# Patient Record
Sex: Female | Born: 1945 | Race: Black or African American | Hispanic: No | Smoking: Former smoker
Health system: Southern US, Community
[De-identification: ages and names within clinical notes are randomized; demographics above are authoritative.]

## PROBLEM LIST (undated history)

## (undated) DIAGNOSIS — C50919 Malignant neoplasm of unspecified site of unspecified female breast: Secondary | ICD-10-CM

## (undated) DIAGNOSIS — I10 Essential (primary) hypertension: Secondary | ICD-10-CM

## (undated) HISTORY — PX: MASTECTOMY: SHX3

## (undated) HISTORY — PX: OTHER SURGICAL HISTORY: SHX169

---

## 2001-06-29 ENCOUNTER — Encounter (HOSPITAL_COMMUNITY): Payer: Self-pay | Admitting: Oncology

## 2001-06-29 ENCOUNTER — Encounter (HOSPITAL_COMMUNITY): Admission: RE | Admit: 2001-06-29 | Discharge: 2001-07-29 | Payer: Self-pay | Admitting: Oncology

## 2001-06-29 ENCOUNTER — Encounter: Admission: RE | Admit: 2001-06-29 | Discharge: 2001-06-29 | Payer: Self-pay | Admitting: Oncology

## 2002-03-30 ENCOUNTER — Encounter: Payer: Self-pay | Admitting: Family Medicine

## 2002-03-30 ENCOUNTER — Ambulatory Visit (HOSPITAL_COMMUNITY): Admission: RE | Admit: 2002-03-30 | Discharge: 2002-03-30 | Payer: Self-pay | Admitting: General Surgery

## 2002-06-10 ENCOUNTER — Emergency Department (HOSPITAL_COMMUNITY): Admission: EM | Admit: 2002-06-10 | Discharge: 2002-06-10 | Payer: Self-pay | Admitting: *Deleted

## 2002-06-10 ENCOUNTER — Encounter: Payer: Self-pay | Admitting: *Deleted

## 2002-06-22 ENCOUNTER — Ambulatory Visit (HOSPITAL_COMMUNITY): Admission: RE | Admit: 2002-06-22 | Discharge: 2002-06-22 | Payer: Self-pay | Admitting: General Surgery

## 2002-07-26 ENCOUNTER — Encounter: Payer: Self-pay | Admitting: *Deleted

## 2002-07-26 ENCOUNTER — Ambulatory Visit (HOSPITAL_COMMUNITY): Admission: RE | Admit: 2002-07-26 | Discharge: 2002-07-26 | Payer: Self-pay | Admitting: *Deleted

## 2002-11-07 ENCOUNTER — Inpatient Hospital Stay (HOSPITAL_COMMUNITY): Admission: AD | Admit: 2002-11-07 | Discharge: 2002-11-11 | Payer: Self-pay | Admitting: General Surgery

## 2002-11-07 ENCOUNTER — Encounter: Payer: Self-pay | Admitting: General Surgery

## 2003-11-15 ENCOUNTER — Ambulatory Visit (HOSPITAL_COMMUNITY): Admission: RE | Admit: 2003-11-15 | Discharge: 2003-11-15 | Payer: Self-pay | Admitting: General Surgery

## 2003-11-28 ENCOUNTER — Inpatient Hospital Stay (HOSPITAL_COMMUNITY): Admission: RE | Admit: 2003-11-28 | Discharge: 2003-12-01 | Payer: Self-pay | Admitting: General Surgery

## 2004-01-08 ENCOUNTER — Encounter (HOSPITAL_COMMUNITY): Admission: RE | Admit: 2004-01-08 | Discharge: 2004-02-02 | Payer: Self-pay | Admitting: Oncology

## 2004-01-08 ENCOUNTER — Encounter: Admission: RE | Admit: 2004-01-08 | Discharge: 2004-02-02 | Payer: Self-pay | Admitting: Oncology

## 2004-01-08 ENCOUNTER — Ambulatory Visit (HOSPITAL_COMMUNITY): Payer: Self-pay | Admitting: Oncology

## 2004-02-12 ENCOUNTER — Encounter (HOSPITAL_COMMUNITY): Admission: RE | Admit: 2004-02-12 | Discharge: 2004-03-13 | Payer: Self-pay | Admitting: Oncology

## 2004-02-12 ENCOUNTER — Encounter: Admission: RE | Admit: 2004-02-12 | Discharge: 2004-02-12 | Payer: Self-pay | Admitting: Oncology

## 2004-03-15 ENCOUNTER — Ambulatory Visit (HOSPITAL_COMMUNITY): Admission: RE | Admit: 2004-03-15 | Discharge: 2004-03-15 | Payer: Self-pay | Admitting: General Surgery

## 2004-03-21 ENCOUNTER — Encounter (HOSPITAL_COMMUNITY): Admission: RE | Admit: 2004-03-21 | Discharge: 2004-04-20 | Payer: Self-pay | Admitting: Oncology

## 2004-03-21 ENCOUNTER — Ambulatory Visit (HOSPITAL_COMMUNITY): Payer: Self-pay | Admitting: Oncology

## 2004-04-12 ENCOUNTER — Ambulatory Visit (HOSPITAL_COMMUNITY): Payer: Self-pay | Admitting: Oncology

## 2004-04-15 ENCOUNTER — Encounter: Admission: AD | Admit: 2004-04-15 | Discharge: 2004-04-15 | Payer: Self-pay | Admitting: Dentistry

## 2004-04-15 ENCOUNTER — Ambulatory Visit: Payer: Self-pay | Admitting: Dentistry

## 2004-04-25 ENCOUNTER — Encounter (HOSPITAL_COMMUNITY): Admission: RE | Admit: 2004-04-25 | Discharge: 2004-05-25 | Payer: Self-pay | Admitting: Oncology

## 2004-04-25 ENCOUNTER — Encounter: Admission: RE | Admit: 2004-04-25 | Discharge: 2004-04-25 | Payer: Self-pay | Admitting: Oncology

## 2004-05-03 ENCOUNTER — Ambulatory Visit (HOSPITAL_COMMUNITY): Admission: RE | Admit: 2004-05-03 | Discharge: 2004-05-03 | Payer: Self-pay | Admitting: Dentistry

## 2004-05-03 ENCOUNTER — Ambulatory Visit: Payer: Self-pay | Admitting: Dentistry

## 2004-05-13 ENCOUNTER — Ambulatory Visit (HOSPITAL_COMMUNITY): Payer: Self-pay | Admitting: Oncology

## 2004-05-29 ENCOUNTER — Encounter: Admission: RE | Admit: 2004-05-29 | Discharge: 2004-05-29 | Payer: Self-pay | Admitting: Oncology

## 2004-05-29 ENCOUNTER — Encounter (HOSPITAL_COMMUNITY): Admission: RE | Admit: 2004-05-29 | Discharge: 2004-06-28 | Payer: Self-pay | Admitting: Oncology

## 2004-06-24 ENCOUNTER — Ambulatory Visit: Payer: Self-pay | Admitting: Dentistry

## 2004-06-30 ENCOUNTER — Ambulatory Visit (HOSPITAL_COMMUNITY): Payer: Self-pay | Admitting: Oncology

## 2004-07-05 ENCOUNTER — Encounter: Admission: RE | Admit: 2004-07-05 | Discharge: 2004-07-05 | Payer: Self-pay | Admitting: Oncology

## 2004-07-05 ENCOUNTER — Encounter (HOSPITAL_COMMUNITY): Admission: RE | Admit: 2004-07-05 | Discharge: 2004-08-04 | Payer: Self-pay | Admitting: Oncology

## 2004-08-08 ENCOUNTER — Encounter (HOSPITAL_COMMUNITY): Admission: RE | Admit: 2004-08-08 | Discharge: 2004-09-07 | Payer: Self-pay | Admitting: Oncology

## 2004-08-08 ENCOUNTER — Encounter: Admission: RE | Admit: 2004-08-08 | Discharge: 2004-08-08 | Payer: Self-pay | Admitting: Oncology

## 2004-08-29 ENCOUNTER — Ambulatory Visit (HOSPITAL_COMMUNITY): Payer: Self-pay | Admitting: Oncology

## 2004-08-30 ENCOUNTER — Ambulatory Visit (HOSPITAL_COMMUNITY): Payer: Self-pay | Admitting: Oncology

## 2004-09-19 ENCOUNTER — Encounter: Admission: RE | Admit: 2004-09-19 | Discharge: 2004-09-19 | Payer: Self-pay | Admitting: Oncology

## 2004-09-19 ENCOUNTER — Encounter (HOSPITAL_COMMUNITY): Admission: RE | Admit: 2004-09-19 | Discharge: 2004-10-19 | Payer: Self-pay | Admitting: Oncology

## 2004-10-14 ENCOUNTER — Ambulatory Visit (HOSPITAL_COMMUNITY): Payer: Self-pay | Admitting: Oncology

## 2004-10-21 ENCOUNTER — Encounter: Admission: RE | Admit: 2004-10-21 | Discharge: 2004-11-02 | Payer: Self-pay | Admitting: Oncology

## 2004-11-15 ENCOUNTER — Encounter: Admission: RE | Admit: 2004-11-15 | Discharge: 2004-11-15 | Payer: Self-pay | Admitting: Oncology

## 2004-12-09 ENCOUNTER — Ambulatory Visit (HOSPITAL_COMMUNITY): Payer: Self-pay | Admitting: Oncology

## 2004-12-19 ENCOUNTER — Encounter: Admission: RE | Admit: 2004-12-19 | Discharge: 2004-12-19 | Payer: Self-pay | Admitting: Oncology

## 2005-02-24 ENCOUNTER — Ambulatory Visit (HOSPITAL_COMMUNITY): Payer: Self-pay | Admitting: Oncology

## 2005-02-24 ENCOUNTER — Encounter: Admission: RE | Admit: 2005-02-24 | Discharge: 2005-02-24 | Payer: Self-pay | Admitting: Oncology

## 2005-03-31 ENCOUNTER — Encounter (HOSPITAL_COMMUNITY): Admission: RE | Admit: 2005-03-31 | Discharge: 2005-04-30 | Payer: Self-pay | Admitting: Oncology

## 2005-03-31 ENCOUNTER — Encounter: Admission: RE | Admit: 2005-03-31 | Discharge: 2005-03-31 | Payer: Self-pay | Admitting: Oncology

## 2005-04-21 ENCOUNTER — Ambulatory Visit (HOSPITAL_COMMUNITY): Payer: Self-pay | Admitting: Oncology

## 2005-12-16 ENCOUNTER — Ambulatory Visit: Payer: Self-pay | Admitting: Family Medicine

## 2005-12-19 ENCOUNTER — Encounter (HOSPITAL_COMMUNITY): Admission: RE | Admit: 2005-12-19 | Discharge: 2006-01-18 | Payer: Self-pay | Admitting: Oncology

## 2005-12-19 ENCOUNTER — Encounter: Admission: RE | Admit: 2005-12-19 | Discharge: 2005-12-19 | Payer: Self-pay | Admitting: Oncology

## 2005-12-19 ENCOUNTER — Ambulatory Visit (HOSPITAL_COMMUNITY): Payer: Self-pay | Admitting: Oncology

## 2005-12-24 ENCOUNTER — Ambulatory Visit: Payer: Self-pay | Admitting: Oncology

## 2006-01-01 ENCOUNTER — Encounter: Payer: Self-pay | Admitting: Family Medicine

## 2006-01-01 DIAGNOSIS — I509 Heart failure, unspecified: Secondary | ICD-10-CM | POA: Insufficient documentation

## 2006-01-01 DIAGNOSIS — I10 Essential (primary) hypertension: Secondary | ICD-10-CM | POA: Insufficient documentation

## 2006-01-01 DIAGNOSIS — J449 Chronic obstructive pulmonary disease, unspecified: Secondary | ICD-10-CM

## 2006-01-01 DIAGNOSIS — M545 Low back pain, unspecified: Secondary | ICD-10-CM | POA: Insufficient documentation

## 2006-01-01 DIAGNOSIS — F411 Generalized anxiety disorder: Secondary | ICD-10-CM | POA: Insufficient documentation

## 2006-01-01 DIAGNOSIS — J4489 Other specified chronic obstructive pulmonary disease: Secondary | ICD-10-CM | POA: Insufficient documentation

## 2006-01-01 DIAGNOSIS — Z853 Personal history of malignant neoplasm of breast: Secondary | ICD-10-CM

## 2006-11-04 ENCOUNTER — Emergency Department (HOSPITAL_COMMUNITY): Admission: EM | Admit: 2006-11-04 | Discharge: 2006-11-04 | Payer: Self-pay | Admitting: Emergency Medicine

## 2006-11-06 ENCOUNTER — Emergency Department (HOSPITAL_COMMUNITY): Admission: EM | Admit: 2006-11-06 | Discharge: 2006-11-06 | Payer: Self-pay | Admitting: Emergency Medicine

## 2006-12-04 ENCOUNTER — Encounter (HOSPITAL_COMMUNITY): Admission: RE | Admit: 2006-12-04 | Discharge: 2007-01-03 | Payer: Self-pay | Admitting: Oncology

## 2007-01-04 ENCOUNTER — Ambulatory Visit (HOSPITAL_COMMUNITY): Payer: Self-pay | Admitting: Oncology

## 2007-01-04 ENCOUNTER — Encounter (HOSPITAL_COMMUNITY): Admission: RE | Admit: 2007-01-04 | Discharge: 2007-02-03 | Payer: Self-pay | Admitting: Oncology

## 2010-02-23 ENCOUNTER — Encounter (HOSPITAL_COMMUNITY): Payer: Self-pay | Admitting: Oncology

## 2010-02-24 ENCOUNTER — Encounter: Payer: Self-pay | Admitting: General Surgery

## 2010-06-21 NOTE — H&P (Signed)
NAME:  [name], [name]                          ACCOUNT NO.:  [id number]   MEDICAL RECORD NO.:  [id number]                   PATIENT TYPE:  INP   LOCATION:  A228                                 FACILITY:  [facility]   PHYSICIAN:  Dirk Dress. Katrinka Blazing, M.D.                DATE OF BIRTH:  1945/05/22   DATE OF ADMISSION:  11/07/2002  DATE OF DISCHARGE:                                HISTORY & PHYSICAL   HISTORY OF PRESENT ILLNESS:  A 65 year old female with history of  hypertension and recurrent palpitations that has become much worse over the  past two days.  The patient has a history of cardiomyopathy with a finding  of left ventricular ejection fraction of 25-30% in May, 2004, with moderate  to marked low left ventricular hypokinesis.  She was treated and in July,  2004, repeat echocardiogram showed mild global hypokinesis with ejection  fraction of 51%.  The patient had persistent tachycardia which was well  controlled on Toprol 100 mg daily.  She was stable, but her Medicaid ran out  and she ran out of her medications at the end of the month.  She therefore,  could not get Toprol nor could she get Ativan because of lack of money.  Over the last two days, she has had increasing dyspnea, increasing shortness  of breath and worsening palpitations.  She has also had some increased  anxiety.  All of this is felt to be related to Toprol withdrawal and Ativan  withdrawal.  The patient has had some episode of impending doom, but this  probably is due to her medication withdrawal.  She has not had chest pain.  Her episodes of shortness of breath has been related to palpitations and  they have been related to tachycardia induced worsening of her  cardiomyopathy.  The patient was seen in the office and was tachycardic with  a heart rate of 138 to 140.  She appeared to be extremely anxious and  somewhat tremulous.  She is admitted for reinstitution of her medication and  we will make arrangements for her  to get the Toprol and Ativan after she is  stabilized.   PAST MEDICAL HISTORY:  1. Nonischemic cardiomyopathy as noted above with negative stress test in     July, 2004.  2. Hypertension, well-controlled on Toprol.  3. Chronic anxiety, long-term, well-controlled on Ativan until present     situation.  4. History of breast cancer, status post left modified radical mastectomy in     1990.  Eight month history of chemotherapy.  5. Lumbar degenerative disk disease with chronic low back pain.   PAST SURGICAL HISTORY:  1. Excision of mass, left hip in 1992.  2. Total abdominal hysterectomy with bilateral salpingo-oophorectomy many     years ago.   ALLERGIES:  MORPHINE which causes itching.   MEDICATIONS:  1. Ativan 1 mg t.i.d.  2. Toprol  XL 50 mg daily.  3. Flexeril 5 mg t.i.d.  4. Ultram 50 mg daily.  5. Bextra 10 mg daily.  6. Ecotrin 81 mg daily.   FAMILY HISTORY:  The patient is one of a set of quadruplets that was born at  this hospital in 1946/02/02.  Her three sisters all died of complications of  breast cancer.  Her other six siblings died from multiple other illnesses.  She has one child who reportedly is healthy.  Her parents are both deceased.  There is a questionable history of pancreatic cancer, diabetes and stroke.   PHYSICAL EXAMINATION:  GENERAL:  The patient is very anxious.  She is  tremulous at rest and will not sit on the exam table for long.  VITAL SIGNS:  Blood pressure 130/70.  Normally, her blood pressure is about  100/80, pulse 120/140, respirations 20.  Weight 150 pounds.  HEENT:  Unremarkable except for poor dentition.  NECK:  Supple, no JVD, bruit, no adenopathy or thyromegaly.  CHEST:  Clear to auscultation.  No rales, rubs, rhonchi or wheezes.  Chest  wall reveals a well-healed mastectomy site on the left.  No recurrence.  Right breast is atrophic, no masses.  Axillae normal, no adenopathy.  HEART:  Regular rate and rhythm without murmur or gallop as she  has variable  heart rate which did decrease to about 110 after she was allowed to lie down  on the exam table.  ABDOMEN:  Soft, nontender, no masses.  EXTREMITIES:  No cyanosis, clubbing or edema.  NEUROLOGIC:  No focal motor, sensory or cerebellar deficit.   IMPRESSIONS:  1. Recurrent tachycardia due to Toprol withdrawal.  2. Anxiety with exacerbation due to Ativan withdrawal.  3. Cardiomyopathy, nonischemic, possible exacerbation due to tachycardia or     hypertension, still controlled.  4. History of breast carcinoma, status post left modified radical     mastectomy, stable.  5. Lumbar degenerative disk disease with chronic low back pain.   PLAN:  The patient will be admitted and her medications will be  reinstituted.  Hopefully, she has had no further injury and once her  medication is reinstituted, we will be able to make some arrangements for  her to get her medications, since her life actually depends on her  medications.  Will ask Social Service to investigate the reason for  withdrawing of her Medicaid and see if this can be reinstituted.      ___________________________________________                                         Dirk Dress. Katrinka Blazing, M.D.   LCS/MEDQ  D:  11/07/2002  T:  11/08/2002  Job:  [id number]

## 2010-06-21 NOTE — Op Note (Signed)
[name], [name]                ACCOUNT NO.:  [id number]   MEDICAL RECORD NO.:  [id number]          PATIENT TYPE:  AMB   LOCATION:  DAY                           FACILITY:  [facility]   PHYSICIAN:  Jerolyn Shin C. Katrinka Blazing, M.D.   DATE OF BIRTH:  [birth year]   DATE OF PROCEDURE:  03/15/2004  DATE OF DISCHARGE:                                 OPERATIVE REPORT   PREOPERATIVE DIAGNOSIS:  Breast carcinoma.   POSTOPERATIVE DIAGNOSIS:  Breast carcinoma.   PROCEDURE:  Left subclavian dual lumen catheter insertion.   SURGEON:  Dr. Katrinka Blazing.   DESCRIPTION:  The patient was taken to the operating suite. Her left chest  and neck were prepped and draped in a sterile field. Local infiltration was  carried out with 1% Xylocaine. Small incision was made in the outer third of  the subclavicular area. The patient was placed in reversed Trendelenburg  position. The left subclavian vein was accessed with a single stick.  Guidewire was placed under fluoroscopic guidance and positioned in the right  atrium. Next, the catheter was tunneled from the subclavian area to the left  parasternal border at about T5. The introducer was placed under fluoroscopic  guidance, and the catheter was positioned through the introducer through the  junction of the right atrium and superior vena cava. The catheter was  sutured to the dermis at its insertion site. It was then sutured to the skin  with two sutures of 3-0 Prolene. The skin puncture site in the subclavicular  area was closed with 4-0 Vicryl. The patient tolerated the procedure well.  Dressings were placed. She was awakened from anesthesia, transferred to a  bed, and taken to the post-anesthetic care unit for monitoring and for  followup chest x-ray.      LCS/MEDQ  D:  03/15/2004  T:  03/15/2004  Job:  [id number]

## 2010-06-21 NOTE — Discharge Summary (Signed)
NAME:  [name], [name]                          ACCOUNT NO.:  [id number]   MEDICAL RECORD NO.:  [id number]                   PATIENT TYPE:  INP   LOCATION:  A228                                 FACILITY:  [facility]   PHYSICIAN:  Dirk Dress. Katrinka Blazing, M.D.                DATE OF BIRTH:  1945/11/16   DATE OF ADMISSION:  11/07/2002  DATE OF DISCHARGE:  11/11/2002                                 DISCHARGE SUMMARY   DISCHARGE DIAGNOSES:  1. Rebound tachycardia due to Toprol withdrawal.  2. Anxiety disorder with exacerbation due to Ativan withdrawal.  3. Nonischemic cardiomyopathy complicated by tachycardia.  4. History of breast carcinoma.  5. Lumbar degenerative disk disease with chronic low back pain.   DISPOSITION:  The patient is discharged home in stable and satisfactory  condition.   DISCHARGE MEDICATIONS:  1. Protonix 40 mg every day.  2. Ativan 1 mg t.i.d.  3. Toprol XL 100 mg every day.  4. Aspirin 81 mg every day.  5. Ultram 50 mg b.i.d.   The patient is scheduled to be seen in the office in one week.   SUMMARY:  This is a 65 year old female with a history of hypertension,  recurrent palpitations that have become much worse over the past two days.  She has a history of cardiomyopathy with a finding of decreased left  ventricular ejection fraction with global hypokinesis.  She had tachycardia  which was well controlled on Toprol.  The patient was stable but her  Medicaid ran out, and she was unable to purchase her medications.  She could  not get Toprol or Ativan.  Over the two days prior to admission she had  increasing dyspnea, shortness of breath, worsening palpitations, and  significantly increased anxiety.  All of this was felt to be related to  Toprol withdrawal and Ativan withdrawal.  The patient had some episodes of  impending doom.  She denied chest pains.  She was seen in the office with  the tachycardia with a heart rate of 140.  She appeared extremely anxious  and  tremulous.  She was admitted for re-institution of her medications and  involvement of social service so that we could make arrangements to get her  medications, after she was stabilized.   EXAMINATION:  GENERAL:  She was anxious and tremulous at rest.  She would  not sit still on the exam table.  VITAL SIGNS:  Blood pressure was 130/70, pulse was 120-140.  LUNGS:  Clear.  HEART:  Regular except increased heart rate.  Other exam was unremarkable.   The patient was admitted and started on her baseline medications.  Cardiac  enzymes and EKGs were done serially to rule out myocardial ischemia.  Her  enzymes remained flat with normal CK-MBs and troponins x 3.  Heart rate was  easily controlled after she was re-instituted on her Toprol therapy.  O2  saturations remained  good on room air.  After institution of Ativan, her  anxiety was improved and her tremor resolved.  Social service became  involved, and they made arrangements for her to have reinstitution of her  Medicaid services.  The patient was advised to increase her activity and  with increase in activity her heart rate remained stable, though she did  have some mild dyspnea.  By November 11, 2002, she was without complaints.  Pulse was about 85, blood pressure 113/70.  Lungs were clear.  It was felt  that she had adequately recovered and she was discharged home in  satisfactory condition.     ___________________________________________                                         Dirk Dress Katrinka Blazing, M.D.   LCS/MEDQ  D:  12/10/2002  T:  12/10/2002  Job:  [id number]

## 2010-06-21 NOTE — Procedures (Signed)
NAME:  [name], [name]                          ACCOUNT NO.:  [id number]   MEDICAL RECORD NO.:  [id number]                   PATIENT TYPE:  OUT   LOCATION:  RAD                                  FACILITY:  [facility]   PHYSICIAN:  Kem Boroughs, M.D.                 DATE OF BIRTH:  1945/10/17   DATE OF PROCEDURE:  06/22/2002  DATE OF DISCHARGE:                                  ECHOCARDIOGRAM   PROCEDURE:  Echocardiogram.   INDICATIONS FOR PROCEDURE:  [name] is a 65 year old female with a  history of tachycardia who was referred for echocardiogram.   IMPRESSION:  The technical quality of the study is reasonable.   The aorta is within normal limits at 3.1 cm.   The left atrium is also within normal limits at 2.4 cm. No obvious clots or  masses were appreciated and the patient appeared to be in sinus rhythm  during this procedure.   The interventricular septum and posterior wall were within normal limits in  thickness at 0.8 cm for each.   The aortic valve appeared reasonably structurally normal with good leaflet  excursion. No aortic insufficiency was noted. Doppler interrogation of the  aortic valve was within normal limits.   The mitral valve appeared mildly thickened, particularly on the distal  portion of the anterior leaflet but with reasonable leaflet excursion. Trace  to mild mitral regurgitation was present. No mitral valve prolapse was  appreciated.   The pulmonic valve was not well visualized.   The tricuspid valve appeared grossly structurally normal with mild tricuspid  regurgitation noted.   The left ventricle was normal in size. Left ventricular systolic function  was markedly decreased with an estimated ejection fraction of 25-30%. The  right atrium and right ventricle appeared normal in size. Right ventricular  systolic function also appeared diminished. No mitral valve prolapse. The  presence of a heart string is noted in the left ventricle, which is a  normal  variant.   IMPRESSION:  1. Normal chamber sizes.  2. Moderate to marked global hypokinesis is present.  3. Estimated ejection fraction of 25-30%.  4. Trace to mild mitral and tricuspid regurgitation.  5. Mildly thickened anterior leaflet of the mitral valve.  6.     No mitral valve prolapse. The presence of a heart string is noted in the     left ventricle, which is a normal variant.  7. In this patient with a history of tachycardia, a tachycardia induced     cardiomyopathy could certainly be in the differential diagnosis. Consider     further cardiac evaluation.                                               Kem Boroughs, M.D.  TB/MEDQ  D:  06/22/2002  T:  06/22/2002  Job:  [id number]   cc:   Dirk Dress. Katrinka Blazing, M.D.  [address]  [place]  [place] [address]  Fax: [phone]

## 2010-06-21 NOTE — Op Note (Signed)
[name], [name]                ACCOUNT NO.:  [id number]   MEDICAL RECORD NO.:  [id number]          PATIENT TYPE:  AMB   LOCATION:  DAY                          FACILITY:  [facility]   PHYSICIAN:  Charlynne Pander, D.D.S.DATE OF BIRTH:  [birth year]   DATE OF PROCEDURE:  05/03/2004  DATE OF DISCHARGE:                                 OPERATIVE REPORT   PREOPERATIVE DIAGNOSES:  1.  Breast cancer.  2.  Prechemotherapy dental protocol.  3.  Chronic periodontitis.  4.  Chronic apical periodontitis.  5.  Multiple routine root segments.  6.  Malocclusion.   POSTOPERATIVE DIAGNOSES:  1.  Breast cancer.  2.  Prechemotherapy dental protocol.  3.  Chronic periodontitis.  4.  Chronic apical periodontitis.  5.  Multiple routine root segments.  6.  Malocclusion.  7.  Bilateral hyperplastic maxillary tuberosities.   OPERATIONS:  1.  Dental examination.  2.  Extraction of remaining teeth number 5, 6, 7, 8, 9, 10, 18, 19, 20, 21,      22, 23, 26, 27, and 29.  3.  Four quadrants of alveoloplasty.  4.  Bilateral maxillary tuberosity reductions  5.  Distal wedge procedure of the mandibular right quadrant from the      retromolar pad anterior to previous tooth number 30.  6.  Buccal exostoses reductions area 12-13.   SURGEON:  Charlynne Pander, D.D.S.   ASSISTANT:  Elliot Dally (Sales executive).   ANESTHESIA:  1.  General anesthesia via nasal endotracheal tube Jenelle Mages. Fortune,      M.D., as the attending.  2.  Local anesthesia with total utilization of five carpules each containing      36 mg of Xylocaine with 0.018 mg of epinephrine as well as two carpules      containing 9 mg of Marcaine with 0.009 mg of epinephrine.   MEDICATION:  Ampicillin 2.0 Grams IV prior to invasive dental procedures.   SPECIMENS:  There were 15 teeth which were discarded.   DRAINS/CULTURES:  None.   COMPLICATIONS:  None.   FLUIDS:  900 mL lactated Ringer's solution.   ESTIMATED BLOOD LOSS:  100  mL.   INDICATIONS FOR PROCEDURE:  The patient was diagnosed with breast cancer and  was planned to begin chemotherapy with Dr. Mariel Sleet.  A dental consultation  was requested to rule out dental infection which may affect the patient's  systemic health while on active chemotherapy.  The patient was examined and  treatment planned for extraction of all remaining teeth with alveoloplasty  and preprosthetic surgery as indicated.  This treatment plan was formulated  to decrease the risks and complications associated with dental infection  from affecting the patient's systemic health while on active chemotherapy as  well as to assist in future prosthodontic rehabilitation.   OPERATIVE FINDINGS:  The patient was examined in operating room #6.  The  teeth were identified for extraction. The patient was noted to be affected  by chronic periodontitis, chronic apical periodontitis, multiple retained  root segments, and significant malocclusion.  During further evaluation of  the occlusion, the patient  was noted to have bilateral maxillary  hyperplastic tuberosities which would need to be reduced to assist in future  fabrication of upper and lower dentures.   DESCRIPTION OF PROCEDURE:  The patient is brought to the main operating room  #6.  The patient is placed in the supine position on the operating room  table.  General anesthesia was induced via nasal endotracheal tube.  A  throat pack was placed at this time.  Patient was then prepped and draped in  the usual manner for a dental medicine procedure.  The oral cavity was  thoroughly examined with the findings as noted above.  Patient was then  ready for the oral surgical procedures as follows:   Local anesthesia was administered to the maxillary quadrants and  sequentially to the mandibular quadrants over the two hour long procedure.  A total utilization of 5 carpules each containing 36 mg of Xylocaine with  0.018 mg of epinephrine along with  2 carpules containing 9 mg of Marcaine  with 0.009 mg of epinephrine were administered.   The maxillary quadrants were first approached.  Anesthesia was delivered as  previously described.  A 15 blade incision was made from the distal of the  maxillary right tuberosity through the distal of #14.  Surgical flap was  then carefully reflected.  The remaining tooth numbers 5, 6, 7, 8, 9 and 10  were then subluxated with a series of straight elevators and removed with a  150 forceps without complications.  Alveoloplasty was then performed  utilizing a rongeurs and bone file.  A maxillary right tuberosity reduction  was then achieved utilizing rongeurs and bone file, soft tissue pickups and  a 15 blade incision to remove the redundant tissues.  Prominent buccal  exostoses on the facial of tooth numbers 12, 13 area was carefully removed  at this time with a rongeur and bone file.  Irrigation was then used to  debride the area using sterile saline x2.  The tissues were approximated and  trimmed appropriately.  The surgical site on the maxillary left was then  closed from the distal of #14 through the mesial of #9 utilizing 3-0 chromic  gut suture in a continuous interrupted suture technique x1.  The maxillary  right quadrant was then approached and closed utilizing 3-0 chromic gut  suture material in a continuous interrupted suture technique from the distal  of the maxillary tuberosity through the mesial of #8 utilizing a continuous  interrupted suture technique x1.   The remaining mandibular quadrants were then approached.  Anesthesia was  delivered as previously described.  A 15 blade incision was made from the  distal of #30 through the distal of #17.  Surgical flap was then carefully  reflected.  Teeth were subluxated and then removed with a 151 forceps  without complications.  Significant alveoloplasty then performed utilizing a rongeurs and bone file.  Granulation tissue in the area of  tooth numbers 22  and 23 was then carefully removed with rongeurs and bone file.  The surgical  sites were then irrigated with copious amounts of sterile saline.  The  tissues were approximated and trimmed appropriately.  The mandibular left  quadrant was then closed from the distal of #17 through the mesial of #24  utilizing 3-0 chromic gut suture in a continuous interrupted suture  technique x1.  The mandibular right quadrant was then approached and closed  from the mesial of #30 through the mesial of #25 utilizing 3-0 chromic gut  suture material in a continuous interrupted suture technique x1.  At this  point in time the entire mouth was irrigated with copious amounts of sterile  saline.  The jaws were approximately to determine if there was sufficient  room for dentures.  At this point in time, it was determined that the  maxillary left quadrant would need to be reduced further and the mandibular  right quadrant in the area of the retromolar pad and anterior to  approximately #30 area would also need to have a distal wedge procedure to  remove excess soft tissues.  The maxillary left quadrant was then  approached.  A #15 blade incision was made from the maxillary left  tuberosity through the mesial of #14.  Flap was carefully reflected.  Redundant tissues were removed with a #15 blade and soft tissue pickups.  The tissues were approximated and trimmed appropriately.  The surgical site  was then irrigated with copious amounts of sterile saline.  Surgical site  was then closed from the distal tuberosity through the mesial of #30  utilizing 3-0 chromic gut suture material in a continuous interrupted suture  technique x1.   The mandibular right quadrant was then approached. A 15 blade incision was  made from the retromolar pad area anterior to #30 along the alveolar ridge.  Redundant tissues were removed with a 15 blade and soft tissue pickups.  The  tissues were approximated and trimmed  appropriately.  The surgical site was  then irrigated with copious amounts of sterile saline.  Surgical site was  then closed from the tuberosity through the mesial of #30 utilizing 4-0  chromic gut suture in a continuous interrupted suture technique x1.   At this point in time, the entire mouth was irrigated again.  The patient  was examined for complications, seeing none, the dental medicine procedure  was deemed to be complete.  The throat pack was removed at this time.  A  series of 4x4 gauzes and oral airway were placed at the request of the  anesthesia team.  Patient was then handed over to the anesthesia team for  final disposition.  After appropriate amount of time, patient was extubated  and taken to the post anesthesia care unit with stable vital signs, good  oxygenation level.  All counts were correct for the dental medicine  procedure.  Patient will be given appropriate pain medication and will be followed up in approximately one week for evaluation for suture removal.      RFK/MEDQ  D:  05/03/2004  T:  05/03/2004  Job:  [id number]   cc:   Ladona Horns. Neijstrom, MD  [address]  [place]  [place] [address]  Fax: [phone]

## 2010-06-21 NOTE — Discharge Summary (Signed)
[name], [name]                ACCOUNT NO.:  [id number]   MEDICAL RECORD NO.:  [id number]          PATIENT TYPE:  INP   LOCATION:  A301                          FACILITY:  [facility]   PHYSICIAN:  Jerolyn Shin C. Katrinka Blazing, M.D.   DATE OF BIRTH:  02-06-1945   DATE OF ADMISSION:  11/28/2003  DATE OF DISCHARGE:  10/28/2005LH                                 DISCHARGE SUMMARY   DISCHARGE DIAGNOSES:  1.  Infiltrating ductal carcinoma, right breast, T1 N1, with 1 of 9 nodes      positive for metastases.  2.  Nonischemic cardiomyopathy.  3.  Hypertension.  4.  Chronic anxiety.  5.  Supraventricular tachycardia.  6.  Lumbar degenerative disk disease.   SPECIAL PROCEDURE:  Right modified radical mastectomy, October 25.   DISPOSITION:  The patient is discharged home in stable satisfactory  condition.   DISCHARGE MEDICATIONS:  1.  Levaquin 500 mg q.d.  2.  Toprol-XL 100 mg q.d.  3.  Protonix 40 mg q.d.  4.  Lorazepam1 mg t.i.d.  5.  Demerol 50 mg q.4h. p.r.n.   FOLLOW UP:  The patient will be seen in the office in 2 weeks. She will have  home health nurse for dressing changes and monitoring of her JP and her  wound for 3 weeks.   SUMMARY:  A 65 year old female with a prior history of breast carcinoma who  presents with new finding of right sided breast mass that showed  mammographic evidence of neoplasia. Core biopsy showed invasive carcinoma of  the breast. The patient was counseled for multiple procedures, and she opted  for mastectomy. Past history is positive for nonischemic cardiomyopathy with  ejection fraction of 54%. She also had hypertension, chronic anxiety, SVT,  and lumbar degenerative disk disease. She is one of four quadruplets and is  the only surviving quadruplet. Her other sisters of the quadruplets have  died of breast cancer. She has two other sisters with history of breast  cancer. There is also a family history of lung cancer, prostate cancer,  gastric cancer, and  pancreatic cancer.   PHYSICAL EXAMINATION:  VITAL SIGNS:  Blood pressure 120/80, pulse 76,  respirations 20, weight 139 pounds.  HEENT:  Showed the teeth to be in poor repair.  NECK:  Supple. No JVD or bruit.  CHEST:  Clear to auscultation.  HEART:  Regular rate and rhythm without murmurs, rubs, or gallops.  ABDOMEN:  Soft, nontender, no masses.  BREASTS:  Absent left breast with well healed chest wall without nodularity.  Tender mass in the right upper outer quadrant. Skin retraction and  ecchymosis. There was no palpable axillary adenopathy.   The patient was scheduled for right total mastectomy with node dissection,  and this was scheduled on October 25. She had an uneventful postoperative  course. By the third postoperative day, she was stable. The wound flaps  looked good. Pain was controlled. She was discharged home in satisfactory  condition.     Lero   LCS/MEDQ  D:  01/14/2004  T:  01/15/2004  Job:  [id number]

## 2010-06-21 NOTE — H&P (Signed)
[name], [name]                ACCOUNT NO.:  [id number]   MEDICAL RECORD NO.:  [id number]          PATIENT TYPE:  AMB   LOCATION:  DAY                           FACILITY:  [facility]   PHYSICIAN:  Jerolyn Shin C. Katrinka Blazing, M.D.   DATE OF BIRTH:  06-May-1945   DATE OF ADMISSION:  DATE OF DISCHARGE:  LH                                HISTORY & PHYSICAL   REASON FOR ADMISSION:  The patient is a 65 year old female with a prior  history of breast carcinoma who presents with new finding of a right-sided  breast mass that shows mammographic evidence of neoplasia.  Core biopsy  showed invasive carcinoma of the breast.  The patient has been counseled and  she opted for mastectomy.   PAST MEDICAL HISTORY:  1.  She has nonischemic cardiomyopathy with ejection fraction of 54%.      Cardiomyopathy is probably related to previous treatment for breast      cancer.  2.  She has hypertension.  3.  Chronic anxiety.  4.  New onset of SVT.  5.  Lumbar degenerative disk disease with chronic low back pain.   PAST SURGICAL HISTORY:  1.  Total abdominal hysterectomy with bilateral salpingo-oophorectomy.  2.  Excision of mass, left hip.  3.  Left modified radical mastectomy.   ALLERGIES:  __________.   FAMILY HISTORY:  She is one of the [employer].  She is the only  surviving quadruplet.  Her sisters all died of breast cancer.  She has two  other sisters with history of breast cancer.  There is also a history of  lung cancer, prostate cancer, gastric cancer, and pancreatic cancer in the  family.   SOCIAL HISTORY:  She is single, disabled.  She has one daughter.  She smokes  up to one-and-a-half packs of cigarettes per day.  She uses an occasional  alcoholic beverage.   PHYSICAL EXAMINATION:  VITAL SIGNS:  Blood pressure 120/80, pulse 76,  respirations 20, weight 139 pounds.  HEENT:  Unremarkable.  Teeth are in poor repair.  NECK:  Supple. No JVD, bruit, adenopathy or thyromegaly.  CHEST:  Clear to  auscultation.  No rales, rubs, rhonchi, or wheezes.  HEART:  Regular rate and rhythm without murmur, gallop or rub.  BREASTS: Absent left breast with well-healed chest wall without nodularity.  There is a tender mass in the right upper outer quadrant with skin  retraction and ecchymosis.  Axilla normal bilaterally without mass.  ABDOMEN:  Soft, nontender.  No masses.  Normal active bowel sounds.  EXTREMITIES:  No cyanosis, clubbing or edema.  NEUROLOGIC:  No focal motor, sensory or cerebellar deficits.   IMPRESSION:  1.  Invasive carcinoma, right breast.  2.  History of left breast carcinoma.  3.  Ischemic cardiomyopathy.  4.  Lumbar degenerative disk disease.  5.  Hypertension.  6.  Supraventricular tachycardia.  7.  Chronic anxiety disorder.   PLAN:  The patient will have total mastectomy with node dissection.     Lero   LCS/MEDQ  D:  11/28/2003  T:  11/28/2003  Job:  [id number] 

## 2010-06-21 NOTE — Op Note (Signed)
NAMEELEFTHERIA, TABORN                ACCOUNT NO.:  [id number]   MEDICAL RECORD NO.:  [id number]          PATIENT TYPE:  AMB   LOCATION:  DAY                           FACILITY:  [facility]   PHYSICIAN:  Jerolyn Shin C. Katrinka Blazing, M.D.   DATE OF BIRTH:  July 19, 1945   DATE OF PROCEDURE:  11/28/2003  DATE OF DISCHARGE:                                 OPERATIVE REPORT   PREOPERATIVE DIAGNOSIS:  Invasive carcinoma, right breast.   POSTOPERATIVE DIAGNOSIS:  Invasive carcinoma, right breast.   PROCEDURE:  Right  modified radical mastectomy.   SURGEON:  Dirk Dress. Katrinka Blazing, M.D.   DESCRIPTION:  Under general anesthesia, the patient's right chest, breast,  and arm were prepped and draped in a sterile field.  An elliptical incision  was made.  This surrounded the nipple and extending to the axilla.  Superior  medial breast flap was developed initially with removal of all of the breast  tissue down to the pectoralis fascia.  The breast was separated from the  pectoralis fascia, from the sternum medially to the lateral border to the  pectoralis major.  The inferior lateral breast flap was developed.  The  breast was then dissected up to the tail of the breast into the axilla.  The  axillary fascia was entered and on palpation of the axilla, there were at  least five firm, palpable nodes that were of some concern.  My initial plan  was to do a limited node dissection, but there were two larger hard nodes  lying along the axillary vein.  The dissection was therefore continued up to  the axillary vein.  This area was marked as the apex of the axilla.  The  thoracodorsal neurovascular bundle and the long thoracic nerve of Bell were  preserved.  All other nerves and vessels were clamped with hemoclips and  divided.  The specimen was passed off to the circulating nurse.  Hemostasis  was observed.  Two JP drains were placed and secured with 3-0 plain suture.  The skin flaps were closed using running 2-0 and 3-0 Monocryl.   The skin  edges were stapled.  The drains were secured with 3-0 nylon.  OpSite  dressing and dressing sponges were placed.  The patient tolerated the  procedure well.  She was awakened from anesthesia, transferred to a bed, and  taken to the postanesthesia care unit for further monitoring.     Lero   LCS/MEDQ  D:  11/28/2003  T:  11/28/2003  Job:  [id number]

## 2010-06-21 NOTE — H&P (Signed)
NAMEHAILIE, [name]                ACCOUNT NO.:  [id number]   MEDICAL RECORD NO.:  [id number]           PATIENT TYPE:   LOCATION:                                 FACILITY:   PHYSICIAN:  Dirk Dress. Katrinka Blazing, M.D.        DATE OF BIRTH:   DATE OF ADMISSION:  DATE OF DISCHARGE:  LH                                HISTORY & PHYSICAL   A 65 year old female with history of infiltrating ductal carcinoma, right  breast, T1, N1, with one of nine nodes positive for metastasis. The patient  had a mastectomy in October of 2005. For reason, she had delayed having  chemotherapy. She now presents for Hickman catheter insertion for  chemotherapy.   PAST HISTORY:  1.  She has nonischemic cardiomyopathy.  2.  Hypertension.  3.  History of supraventricular tachycardia.  4.  Lumbar degenerative disk disease.  5.  She also has chronic anxiety disorder.   PAST SURGICAL HISTORY:  1.  Total abdominal hysterectomy.  2.  Bilateral salpingo-oophorectomy.  3.  Left modified radical mastectomy.  4.  Right modified radical mastectomy.   FAMILY HISTORY:  The patient is the only surviving quadruplet. Her sisters  all died of breast cancer. There is also a family history of lung cancer,  gastric cancer, and pancreatic cancer in the family.   SOCIAL HISTORY:  She is single, disabled. She has one daughter. She smokes  one and a half packs of cigarettes a day. She takes an occasional alcoholic  beverage.   MEDICATIONS:  1.  Toprol-XL 100 mg q.d.  2.  Protonix 40 mg q.d.  3.  Lorazepam 1 mg t.i.d.  4.  Demerol 50 mg q.4h. p.r.n. pain.   PHYSICAL EXAMINATION:  VITAL SIGNS:  Blood pressure 110/80, pulse 84,  respirations 20, weight 135 pounds.  HEENT:  Unremarkable.  NECK:  Supple. No JVD, bruit, adenopathy, or thyromegaly.  CHEST:  Clear to auscultation. She has a healed right chest wall without any  evidence of infection. Left chest wall is well healed also. Axilla without  masses.  ABDOMEN:  Soft, nontender.  No masses. Normal bowel sounds.  EXTREMITIES:  No clubbing, cyanosis, or edema.  NEUROLOGICAL:  No focal motor, sensory, or cerebellar deficits.   IMPRESSION:  1.  T1, N1, infiltrating ductal carcinoma, right breast.  2.  History of left breast carcinoma.  3.  Nonischemic cardiomyopathy.  4.  Hypertension.  5.  History of supraventricular tachycardia.  6.  Lumbar degenerative disk disease.  7.  Chronic anxiety syndrome.   PLAN:  Hickman catheter insertion for venous access for chemotherapy.      LCS/MEDQ  D:  03/14/2004  T:  03/15/2004  Job:  [id number]   cc:   [facility]  Fax: [phone]

## 2010-11-11 LAB — COMPREHENSIVE METABOLIC PANEL
Albumin: 3.8
CO2: 27
Calcium: 9.6
Chloride: 107
GFR calc non Af Amer: >60
Glucose, Bld: 107 — ABNORMAL HIGH
Total Bilirubin: 0.4

## 2010-11-11 LAB — DIFFERENTIAL
Basophils Relative: 1
Lymphocytes Relative: 42
Monocytes Absolute: 0.6
Monocytes Relative: 7
Neutro Abs: 4.5

## 2010-11-11 LAB — CANCER ANTIGEN 27.29: CA 27.29: 27

## 2010-11-11 LAB — CBC
HCT: 32.4 — ABNORMAL LOW
MCHC: 33.3
Platelets: 382
RDW: 14.2

## 2010-11-14 LAB — DIFFERENTIAL
Eosinophils Relative: 1
Lymphocytes Relative: 24
Lymphs Abs: 3.4 — ABNORMAL HIGH
Monocytes Absolute: 0.8 — ABNORMAL HIGH

## 2010-11-14 LAB — URINALYSIS, ROUTINE W REFLEX MICROSCOPIC
Bilirubin Urine: NEGATIVE
Specific Gravity, Urine: 1.025
pH: 5

## 2010-11-14 LAB — BASIC METABOLIC PANEL
GFR calc Af Amer: >60
GFR calc non Af Amer: >60
Potassium: 4
Sodium: 135

## 2010-11-14 LAB — POCT I-STAT CREATININE: Operator id: 221061

## 2010-11-14 LAB — URINE MICROSCOPIC-ADD ON

## 2010-11-14 LAB — CBC
HCT: 34.3 — ABNORMAL LOW
Hemoglobin: 11.6 — ABNORMAL LOW
WBC: 14.1 — ABNORMAL HIGH

## 2010-11-25 ENCOUNTER — Other Ambulatory Visit: Payer: Self-pay

## 2010-11-25 ENCOUNTER — Inpatient Hospital Stay (HOSPITAL_COMMUNITY)
Admission: EM | Admit: 2010-11-25 | Discharge: 2010-12-03 | DRG: 314 | Disposition: A | Discharge status: Home Health | Payer: PRIVATE HEALTH INSURANCE | Attending: Internal Medicine | Admitting: Internal Medicine

## 2010-11-25 ENCOUNTER — Encounter: Payer: Self-pay | Admitting: *Deleted

## 2010-11-25 ENCOUNTER — Emergency Department (HOSPITAL_COMMUNITY): Payer: PRIVATE HEALTH INSURANCE

## 2010-11-25 DIAGNOSIS — Z853 Personal history of malignant neoplasm of breast: Secondary | ICD-10-CM

## 2010-11-25 DIAGNOSIS — I428 Other cardiomyopathies: Secondary | ICD-10-CM

## 2010-11-25 DIAGNOSIS — E876 Hypokalemia: Secondary | ICD-10-CM | POA: Diagnosis present

## 2010-11-25 DIAGNOSIS — F411 Generalized anxiety disorder: Secondary | ICD-10-CM | POA: Diagnosis present

## 2010-11-25 DIAGNOSIS — I10 Essential (primary) hypertension: Secondary | ICD-10-CM | POA: Diagnosis present

## 2010-11-25 DIAGNOSIS — J4489 Other specified chronic obstructive pulmonary disease: Secondary | ICD-10-CM | POA: Diagnosis present

## 2010-11-25 DIAGNOSIS — Z720 Tobacco use: Secondary | ICD-10-CM

## 2010-11-25 DIAGNOSIS — A4101 Sepsis due to Methicillin susceptible Staphylococcus aureus: Secondary | ICD-10-CM | POA: Diagnosis present

## 2010-11-25 DIAGNOSIS — I82290 Acute embolism and thrombosis of other thoracic veins: Secondary | ICD-10-CM | POA: Diagnosis present

## 2010-11-25 DIAGNOSIS — I509 Heart failure, unspecified: Secondary | ICD-10-CM

## 2010-11-25 DIAGNOSIS — Y849 Medical procedure, unspecified as the cause of abnormal reaction of the patient, or of later complication, without mention of misadventure at the time of the procedure: Secondary | ICD-10-CM | POA: Diagnosis present

## 2010-11-25 DIAGNOSIS — M545 Low back pain: Secondary | ICD-10-CM

## 2010-11-25 DIAGNOSIS — F17200 Nicotine dependence, unspecified, uncomplicated: Secondary | ICD-10-CM | POA: Diagnosis present

## 2010-11-25 DIAGNOSIS — T82898A Other specified complication of vascular prosthetic devices, implants and grafts, initial encounter: Principal | ICD-10-CM | POA: Diagnosis present

## 2010-11-25 DIAGNOSIS — J449 Chronic obstructive pulmonary disease, unspecified: Secondary | ICD-10-CM

## 2010-11-25 DIAGNOSIS — I871 Compression of vein: Secondary | ICD-10-CM | POA: Diagnosis present

## 2010-11-25 DIAGNOSIS — I8290 Acute embolism and thrombosis of unspecified vein: Secondary | ICD-10-CM

## 2010-11-25 DIAGNOSIS — A419 Sepsis, unspecified organism: Secondary | ICD-10-CM | POA: Diagnosis present

## 2010-11-25 DIAGNOSIS — D649 Anemia, unspecified: Secondary | ICD-10-CM | POA: Diagnosis present

## 2010-11-25 DIAGNOSIS — M25519 Pain in unspecified shoulder: Secondary | ICD-10-CM | POA: Diagnosis present

## 2010-11-25 HISTORY — DX: Malignant neoplasm of unspecified site of unspecified female breast: C50.919

## 2010-11-25 LAB — DIFFERENTIAL
Basophils Relative: 0 % (ref 0–1)
Eosinophils Absolute: 0 10*3/uL (ref 0.0–0.7)
Eosinophils Relative: 0 % (ref 0–5)
Lymphocytes Relative: 14 % (ref 12–46)
Neutro Abs: 17.9 10*3/uL — ABNORMAL HIGH (ref 1.7–7.7)

## 2010-11-25 LAB — BASIC METABOLIC PANEL
BUN: 10 mg/dL (ref 6–23)
Chloride: 88 mEq/L — ABNORMAL LOW (ref 96–112)
GFR calc Af Amer: 79 mL/min — ABNORMAL LOW (ref >90)
Glucose, Bld: 115 mg/dL — ABNORMAL HIGH (ref 70–99)
Potassium: 2.8 mEq/L — ABNORMAL LOW (ref 3.5–5.1)
Sodium: 129 mEq/L — ABNORMAL LOW (ref 135–145)

## 2010-11-25 LAB — POCT I-STAT TROPONIN I: Troponin i, poc: 0.03 ng/mL (ref 0.00–0.08)

## 2010-11-25 LAB — CBC
HCT: 31.8 % — ABNORMAL LOW (ref 36.0–46.0)
Hemoglobin: 10.7 g/dL — ABNORMAL LOW (ref 12.0–15.0)
MCHC: 33.6 g/dL (ref 30.0–36.0)
RBC: 3.59 MIL/uL — ABNORMAL LOW (ref 3.87–5.11)

## 2010-11-25 LAB — LACTIC ACID, PLASMA: Lactic Acid, Venous: 1.5 mmol/L (ref 0.5–2.2)

## 2010-11-25 LAB — PROTIME-INR
INR: 1.39 (ref 0.00–1.49)
Prothrombin Time: 17.3 seconds — ABNORMAL HIGH (ref 11.6–15.2)

## 2010-11-25 MED ORDER — SODIUM CHLORIDE 0.9 % IV BOLUS (SEPSIS): 500.0000 mL | Freq: Once | INTRAVENOUS | Status: DC

## 2010-11-25 MED ORDER — POTASSIUM CHLORIDE 20 MEQ PO PACK
40.0000 meq | PACK | Freq: Once | ORAL | Status: AC
  Administered 2010-11-25: 40 meq via ORAL
  Filled 2010-11-25: qty 2

## 2010-11-25 MED ORDER — OXYCODONE-ACETAMINOPHEN 5-325 MG PO TABS
2.0000 | ORAL_TABLET | Freq: Once | ORAL | Status: AC
  Administered 2010-11-25: 2 via ORAL
  Filled 2010-11-25: qty 2

## 2010-11-25 MED ORDER — POTASSIUM CHLORIDE 10 MEQ/100ML IV SOLN
10.0000 meq | INTRAVENOUS | Status: AC
  Administered 2010-11-25 – 2010-11-26 (×2): 10 meq via INTRAVENOUS
  Filled 2010-11-25 (×2): qty 100

## 2010-11-25 MED ORDER — HEPARIN (PORCINE) IN NACL 100-0.45 UNIT/ML-% IJ SOLN
1000.0000 [IU]/h | INTRAMUSCULAR | Status: DC
  Administered 2010-11-25: 1000 [IU]/h via INTRAVENOUS
  Filled 2010-11-25: qty 250

## 2010-11-25 MED ORDER — POTASSIUM CHLORIDE CRYS ER 20 MEQ PO TBCR
40.0000 meq | EXTENDED_RELEASE_TABLET | ORAL | Status: AC
  Administered 2010-11-25 – 2010-11-26 (×2): 40 meq via ORAL
  Filled 2010-11-25: qty 2

## 2010-11-25 MED ORDER — VANCOMYCIN HCL IN DEXTROSE 1-5 GM/200ML-% IV SOLN
1000.0000 mg | Freq: Once | INTRAVENOUS | Status: AC
  Administered 2010-11-25: 1000 mg via INTRAVENOUS
  Filled 2010-11-25: qty 200

## 2010-11-25 MED ORDER — IOHEXOL 350 MG/ML SOLN
100.0000 mL | Freq: Once | INTRAVENOUS | Status: AC | PRN
  Administered 2010-11-25: 100 mL via INTRAVENOUS

## 2010-11-25 MED ORDER — SODIUM CHLORIDE 0.9 % IV BOLUS (SEPSIS)
1000.0000 mL | Freq: Once | INTRAVENOUS | Status: AC
  Administered 2010-11-25: 1000 mL via INTRAVENOUS

## 2010-11-25 NOTE — ED Notes (Signed)
Pt denies any SOB at present time.  Does report some pain surrounding catheter in chest. Gauze dressing in place.  Pt denies any additional concerns at present.  No distress noted.

## 2010-11-25 NOTE — ED Provider Notes (Signed)
History     CSN: [id number] Arrival date & time: 11/25/2010  1:46 PM   First MD Initiated Contact with Patient 11/25/10 1423      Chief Complaint  Patient presents with  . Cough    Pain between shoulder blades also.    (Consider location/radiation/quality/duration/timing/severity/associated sxs/prior treatment) Patient is a 64 y.o. female presenting with cough. The history is provided by the patient and a friend.  Cough This is a new problem. The current episode started more than 1 week ago. The problem occurs hourly. The problem has been gradually worsening. The cough is productive of sputum. There has been no fever. Associated symptoms include chills and shortness of breath. Risk factors: breast cancer. She is a smoker.  Pt reports for about a week she has had persistent cough, shortness of breath.  She also reports upper back when taking deep breaths No anterior chest pain No syncope H/o breast ca, but no active treatment, she reports remission She reports she still has indwelling catheter in her chest from previous chemo No vomiting No rectal bleeding No melena No hematemesis No abd pain No hemoptysis  Past Medical History  Diagnosis Date  . Breast cancer     Past Surgical History  Procedure Date  . Mastectomy   . Tumor removal from hip     No family history on file.  History  Substance Use Topics  . Smoking status: Current Everyday Smoker  . Smokeless tobacco: Not on file  . Alcohol Use: No    OB History    Grav Para Term Preterm Abortions TAB SAB Ect Mult Living                  Review of Systems  Constitutional: Positive for chills.  Respiratory: Positive for cough and shortness of breath.   All other systems reviewed and are negative.    Allergies  Morphine and Morphine and related  Home Medications   Current Outpatient Rx  Name Route Sig Dispense Refill  . ASPIRIN EC 81 MG PO TBEC Oral Take 81 mg by mouth daily.      Marland Kitchen METOPROLOL  SUCCINATE 100 MG PO TB24 Oral Take 100 mg by mouth daily.        BP 116/67  Pulse 139  Temp(Src) 99.4 F (37.4 C) (Oral)  Resp 22  Ht 5' 7.5" (1.715 m)  Wt 156 lb 6.4 oz (70.943 kg)  BMI 24.13 kg/m2  SpO2 96%  Physical Exam  CONSTITUTIONAL: Well developed/well nourished HEAD AND FACE: Normocephalic/atraumatic EYES: EOMI/PERRL ENMT: Mucous membranes moist NECK: supple no meningeal signs CV: tachycardia, no murmurs noted LUNGS: decreased BS noted in bases, tachypnea noted Chest: pt has indwelling catheter in left chest, no erythema/drainage noted ABDOMEN: soft, nontender, no rebound or guarding NEURO: Pt is awake/alert, moves all extremitiesx4 EXTREMITIES: pulses normal, full ROM SKIN: warm, color normal PSYCH: no abnormalities of mood noted  ED Course  Procedures (including critical care time)   Labs Reviewed  BASIC METABOLIC PANEL  CBC  DIFFERENTIAL  I-STAT TROPONIN I  LACTIC ACID, PLASMA  CULTURE, BLOOD (ROUTINE X 2)  CULTURE, BLOOD (ROUTINE X 2)   Dg Chest Portable 1 View  11/25/2010  *RADIOLOGY REPORT*  Clinical Data: . Shortness of breath  PORTABLE CHEST - 1 VIEW  Comparison: 03/15/2004  Findings: The lungs are clear without focal infiltrate, edema, pneumothorax or pleural effusion. The cardiopericardial silhouette is within normal limits for size.  Right subclavian catheter tip projects at the mid SVC  level.  Surgical clips are noted in the right axilla.  There is some minimal atelectasis at the lung bases.  IMPRESSION: No edema or focal airspace consolidation.  Original Report Authenticated By: ERIC A. MANSELL, M.D.       MDM  Nursing notes reviewed and considered in documentation All labs/vitals reviewed and considered xrays reviewed and considered   Date: 11/25/2010  Rate: 131   Rhythm: sinus tachycardia  QRS Axis: normal  Intervals: normal  ST/T Wave abnormalities: nonspecific ST changes  Conduction Disutrbances:none  Narrative Interpretation:    Old EKG Reviewed: none available at time of interpretation   4:16 PM H/o breast ca, SOB, pleuritic pain, tachycardia, concern for PE Will get ct imaging   5:53 PM No signs of PE, but does have ?thrombosis to indwelling catheter Given labs, will tx possible line sepsis Vancomycin ordered Pt does feel improved BP 103/63  Pulse 102  Temp(Src) 99.4 F (37.4 C) (Oral)  Resp 18  Ht 5' 7.5" (1.715 m)  Wt 156 lb 6.4 oz (70.943 kg)  BMI 24.13 kg/m2  SpO2 94%   6:09 PM D/w dr Sherrie Mustache, she will call surgery and call back  6:24 PM D/w dr Sherrie Mustache, who spoke to surgeon Given thrombus, will start heparin, will review CT and call back Pt denied any recent h/o GI bleed   6:49 PM Dr zieglar saw patient, can be admitted here and will have catheter removed  CRITICAL CARE Performed by: Joya Gaskins   Total critical care time: 45  Critical care time was exclusive of separately billable procedures and treating other patients.  Critical care was necessary to treat or prevent imminent or life-threatening deterioration.  Critical care was time spent personally by me on the following activities: development of treatment plan with patient and/or surrogate as well as nursing, discussions with consultants, evaluation of patient's response to treatment, examination of patient, obtaining history from patient or surrogate, ordering and performing treatments and interventions, ordering and review of laboratory studies, ordering and review of radiographic studies, pulse oximetry and re-evaluation of patient's condition.   Joya Gaskins, MD 11/25/10 [phone]

## 2010-11-25 NOTE — ED Notes (Signed)
Attempted to contact unit to give report.  Spoke with nurse, who was unable to take report at this time.  Left message with request for return call.

## 2010-11-25 NOTE — H&P (Signed)
PCP:   Evlyn Courier, MD   Chief Complaint:  Chest pain with coughing x days.  HPI: [name] is an 65 y.o. female.    Remote history of breast cancer treated with chemotherapy; hypertension, COPD, and anxiety. Was in good state of health until she developed a flulike illness from which she is recovering, and has noticed that with the coughing she is having a left upper chest pain radiating to her shoulder, aggravated by deep breathing and movement. Pain became so severe the patient presented to the emergency room for treatment, and during her evaluation a CT angiogram of the chest was done.  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 Patient had the left sided Hickman catheter placed in 2006, to facilitate chemotherapy.  She denies fever, she has a resolving cough, denies diaphoresis dizziness, nausea or vomiting; denies swelling of the face or headache.  Past Medical History  Diagnosis Date  . Breast cancer   HTN Anxiety COPD Tobacco abuse  Past Surgical History  Procedure Date  . Mastectomy   . 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 Authorizing Provider  aspirin EC 81 MG tablet Take 81 mg by mouth daily.     Yes Historical Provider, MD  metoprolol (TOPROL-XL) 100 MG 24 hr tablet Take 100 mg by mouth daily.     Yes Historical Provider, MD    PRIOR TO [facility] MEDS Medications Prior to Admission  Medication Dose Route Frequency Provider Last Rate Last Dose  . heparin ADULT infusion 100 units/mL (25000 units/250 mL)  1,000 Units/hr Intravenous Continuous Joya Gaskins, MD 10 mL/hr at 11/25/10 2120 1,000 Units/hr at 11/25/10 2120  . iohexol (OMNIPAQUE) 350 MG/ML injection 100 mL  100 mL Intravenous Once PRN Medication Radiologist   100 mL at 11/25/10 1653  .  oxyCODONE-acetaminophen (PERCOCET) 5-325 MG per tablet 2 tablet  2 tablet Oral Once Joya Gaskins, MD   2 tablet at 11/25/10 1447  . potassium chloride (KLOR-CON) packet 40 mEq  40 mEq Oral Once Joya Gaskins, MD   40 mEq at 11/25/10 1726  . potassium chloride 10 mEq in 100 mL IVPB  10 mEq Intravenous Q1 Hr x 2 Vania Rea      . potassium chloride SA (K-DUR,KLOR-CON) CR tablet 40 mEq  40 mEq Oral Q4H Vania Rea      . sodium chloride 0.9 % bolus 1,000 mL  1,000 mL Intravenous Once Joya Gaskins, MD   1,000 mL at 11/25/10 1721  . sodium chloride 0.9 % bolus 500 mL  500 mL Intravenous Once Joya Gaskins, MD      . vancomycin [facility]) IVPB 1000 mg/200 mL premix  1,000 mg Intravenous Once Joya Gaskins, MD   1,000 mg at 11/25/10 1845   No current outpatient prescriptions on file as of 11/25/2010.    Allergies:  Allergies  Allergen Reactions  . Morphine   . Morphine And Related Itching    Social History:  She does not have any smokeless tobacco history on file. She reports that she does not drink alcohol or use illicit drugs. Smoked half a pack per day until one week ago when she says she is discontinued. No illicit drug use  Family History: No family history on file.  Rewiew of Systems:  The patient denies anorexia, fever, weight loss,, vision loss, decreased hearing, hoarseness, , syncope, dyspnea on exertion, peripheral edema, balance deficits, hemoptysis, abdominal pain, melena, hematochezia, severe indigestion/heartburn, hematuria, incontinence, genital sores, muscle weakness, suspicious skin lesions, transient blindness, difficulty walking, depression, unusual weight change, abnormal bleeding, enlarged lymph nodes, angioedema, and breast masses Physical Exam: Filed Vitals:   11/25/10 1338 11/25/10 1547 11/25/10 1734  BP: 116/67 95/55 103/63  Pulse: 139 116 102  Temp: 99.4 F (37.4 C)    TempSrc: Oral    Resp: 22 19 18   Height: 5' 7.5" (1.715 m)      Weight: 70.943 kg (156 lb 6.4 oz)    SpO2: 96% 91% 94%   Blood pressure 103/63, pulse 102, temperature 99.4 F (37.4 C), temperature source Oral, resp. rate 18, height 5' 7.5" (1.715 m), weight 70.943 kg (156 lb 6.4 oz), SpO2 94.00%.  GEN: Pleasant African American lady sitting up in the stretcher in no acute distress; cooperative with exam; frequently wincing with pain PSYCH: He is alert and oriented x4; appears somewhat anxious does not appear depressed; affect is normal HEENT: Mucous membranes pale and anicteric; PERRLA; EOM intact; no cervical lymphadenopathy nor thyromegaly or carotid bruit; distended external jugular veins even when sitting up Breasts:: Status post bilateral mastectomy; well-healed CHEST WALL: No tenderness; Hickman catheter in left chest no evidence of inflammation CHEST: Normal respiration, clear to auscultation bilaterally HEART: Regular rate and rhythm; no murmurs rubs or gallops BACK: No kyphosis or scoliosis; no CVA tenderness ABDOMEN: Obese, soft non-tender; no masses, no organomegaly, normal abdominal bowel sounds; no pannus; no intertriginous candida. Rectal Exam: Not done EXTREMITIES: N; age-appropriate arthropathy of the hands and knees; no edema; no ulcerations. Genitalia: not examined PULSES: 2+ and symmetric SKIN: Normal hydration no rash or ulceration; nonmalignant moles on leg CNS: Cranial nerves 2-12 grossly intact no focal neurologic deficit   Labs & Imaging Results for orders placed during the hospital encounter of 11/25/10 (from the past 48 hour(s))  BASIC METABOLIC PANEL     Status: Abnormal   Collection Time   11/25/10  3:02 PM      Component Value Range Comment   Sodium 129 (*) 135 - 145 (mEq/L)    Potassium 2.8 (*) 3.5 - 5.1 (mEq/L)    Chloride 88 (*) 96 - 112 (mEq/L)    CO2 27  19 - 32 (mEq/L)    Glucose, Bld 115 (*) 70 - 99 (mg/dL)    BUN 10  6 - 23 (mg/dL)    Creatinine, Ser 1.47  0.50 - 1.10 (mg/dL)    Calcium 9.9  8.4 - 10.5  (mg/dL)    GFR calc non Af Amer 68 (*) >90 (mL/min)    GFR calc Af Amer 79 (*) >90 (mL/min)   CBC     Status: Abnormal   Collection Time   11/25/10  3:02 PM      Component Value Range Comment   WBC 22.7 (*) 4.0 - 10.5 (K/uL)    RBC 3.59 (*) 3.87 - 5.11 (MIL/uL)    Hemoglobin 10.7 (*) 12.0 - 15.0 (g/dL)    HCT 82.9 (*) 56.2 - 46.0 (%)    MCV 88.6  78.0 - 100.0 (fL)    MCH 29.8  26.0 - 34.0 (pg)    MCHC 33.6  30.0 - 36.0 (g/dL)    RDW 13.0  86.5 - 78.4 (%)    Platelets 400  150 - 400 (K/uL)   DIFFERENTIAL     Status:  Abnormal   Collection Time   11/25/10  3:02 PM      Component Value Range Comment   Neutrophils Relative 79 (*) 43 - 77 (%)    Lymphocytes Relative 14  12 - 46 (%)    Monocytes Relative 7  3 - 12 (%)    Eosinophils Relative 0  0 - 5 (%)    Basophils Relative 0  0 - 1 (%)    Neutro Abs 17.9 (*) 1.7 - 7.7 (K/uL)    Lymphs Abs 3.2  0.7 - 4.0 (K/uL)    Monocytes Absolute 1.6 (*) 0.1 - 1.0 (K/uL)    Eosinophils Absolute 0.0  0.0 - 0.7 (K/uL)    Basophils Absolute 0.0  0.0 - 0.1 (K/uL)    WBC Morphology ATYPICAL LYMPHOCYTES      Smear Review LARGE PLATELETS PRESENT   GIANT PLATELETS SEEN  LACTIC ACID, PLASMA     Status: Normal   Collection Time   11/25/10  3:02 PM      Component Value Range Comment   Lactic Acid, Venous 1.5  0.5 - 2.2 (mmol/L)   PROTIME-INR     Status: Abnormal   Collection Time   11/25/10  3:02 PM      Component Value Range Comment   Prothrombin Time 17.3 (*) 11.6 - 15.2 (seconds)    INR 1.39  0.00 - 1.49    APTT     Status: Abnormal   Collection Time   11/25/10  3:02 PM      Component Value Range Comment   aPTT 41 (*) 24 - 37 (seconds)   POCT I-STAT TROPONIN I     Status: Normal   Collection Time   11/25/10  3:17 PM      Component Value Range Comment   Troponin i, poc 0.03  0.00 - 0.08 (ng/mL)    Comment 3             Ct Angio Chest W/cm &/or Wo Cm  11/25/2010  *RADIOLOGY REPORT*  Clinical Data:  Shortness of breath.  Productive cough.   Back pain. History breast cancer.  CT ANGIOGRAPHY CHEST WITH CONTRAST  Technique:  Multidetector CT imaging of the chest was performed using the standard protocol during bolus administration of intravenous contrast.  Multiplanar CT image reconstructions including MIPs were obtained to evaluate the vascular anatomy.  Contrast: OMNIPAQUE IOHEXOL 350 MG/ML IV SOLN  Comparison:  Chest radiograph from 11/25/2010  Findings:  A left central line is noted with surrounding thrombus in the distal SVC. This nearly includes the distal SVC, with much of the contrast extending from the azygos and hemiazygous systems and potentially up from the IVC, versus IVC reflux.  No abnormal contour of the interventricular septum is noted.  There is stranding around the axillary vein and left subclavian vein, which could possibly be thrombosed.  There is also some stranding around the brachiocephalic vein.  There is also an aberrant left subclavian artery which passes posterior to the esophagus.  Trace pericardial effusion noted.  No filling defect is identified in the pulmonary arterial tree to suggest pulmonary embolus.  A small subcutaneous nodular density measuring 0.8 x 0.5 cm in the right medial chest on image 45 of series 7 is probably incidental.  Mediastinal and subcarinal venous collaterals are present due to the SVC stenosis/occlusion.  Emphysema noted.  There is subsegmental atelectasis in the lingula and right lower lobe there is also mild atelectasis in the left lower lobe and  right middle lobe.  No compelling findings of osseous metastatic disease noted.  Review of the MIP images confirms the above findings.  IMPRESSION:  1.  No embolus is observed. 2.  Fibrin sheath and probably thrombus in the distal SVC around the left subclavian catheter causes near occlusion of the SVC, with mediastinal collateral vessels noted with prominent azygos and hemiazygous contrast likely feeding into the inferior vena cava. There is  abnormal stranding around the left axillary, brachial cephalic, and subclavian veins, possibly from thrombosis and inflammation.  Tumor recurrence is not excluded but is considered less likely given the constellation of findings.  I recommend left upper extremity venous ultrasound for further workup. 3.  Aberrant right subclavian artery. 4.  Emphysema. 5.  Atelectasis in the lung bases. 6.  Trace pericardial effusion.  Original Report Authenticated By: Dellia Cloud, M.D.   Dg Chest Portable 1 View  11/25/2010  *RADIOLOGY REPORT*  Clinical Data: . Shortness of breath  PORTABLE CHEST - 1 VIEW  Comparison: 03/15/2004  Findings: The lungs are clear without focal infiltrate, edema, pneumothorax or pleural effusion. The cardiopericardial silhouette is within normal limits for size.  Right subclavian catheter tip projects at the mid SVC level.  Surgical clips are noted in the right axilla.  There is some minimal atelectasis at the lung bases.  IMPRESSION: No edema or focal airspace consolidation.  Original Report Authenticated By: ERIC A. MANSELL, M.D.      Assessment Present on Admission:  .ANXIETY .COPD .HYPERTENSION .Superior vena cava obstruction with collaterals .Shoulder region pain .Tobacco abuse  Anemia Hypokalemia  PLAN: Patient discussed with the vascular surgeons; agree with removal of the subclavian catheter and 6 months anti-coagulation. Will give heparin until the procedure is completed.  Nebulizations when necessary  Continue antihypertensive medications;  Evaluate anemia;  Consult on tobacco cessation;  Empiric pain management; Other plans as per orders.    [name] 11/25/2010, 10:02 PM

## 2010-11-25 NOTE — ED Notes (Signed)
Pt states she has a productive cough at times, which is clear. Pt states she recently had the flu. Pt states nausea at times, last vomited a week ago. Decreased appetite. Pt states pain between shoulder blades for more than a week.

## 2010-11-26 DIAGNOSIS — I509 Heart failure, unspecified: Secondary | ICD-10-CM

## 2010-11-26 LAB — CBC
Hemoglobin: 9.3 g/dL — ABNORMAL LOW (ref 12.0–15.0)
MCH: 30.1 pg (ref 26.0–34.0)
MCV: 88.7 fL (ref 78.0–100.0)
RBC: 3.09 MIL/uL — ABNORMAL LOW (ref 3.87–5.11)
WBC: 20.1 10*3/uL — ABNORMAL HIGH (ref 4.0–10.5)

## 2010-11-26 LAB — HEPATIC FUNCTION PANEL
Albumin: 3.3 g/dL — ABNORMAL LOW (ref 3.5–5.2)
Bilirubin, Direct: 0.2 mg/dL (ref 0.0–0.3)
Indirect Bilirubin: 0.4 mg/dL (ref 0.3–0.9)
Total Bilirubin: 0.6 mg/dL (ref 0.3–1.2)

## 2010-11-26 LAB — BASIC METABOLIC PANEL
CO2: 25 mEq/L (ref 19–32)
Glucose, Bld: 106 mg/dL — ABNORMAL HIGH (ref 70–99)
Potassium: 3.9 mEq/L (ref 3.5–5.1)
Sodium: 130 mEq/L — ABNORMAL LOW (ref 135–145)

## 2010-11-26 LAB — CREATININE, SERUM
Creatinine, Ser: 0.88 mg/dL (ref 0.50–1.10)
GFR calc non Af Amer: 67 mL/min — ABNORMAL LOW (ref >90)

## 2010-11-26 LAB — HEPARIN LEVEL (UNFRACTIONATED): Heparin Unfractionated: 0.19 IU/mL — ABNORMAL LOW (ref 0.30–0.70)

## 2010-11-26 LAB — URINALYSIS, ROUTINE W REFLEX MICROSCOPIC
Ketones, ur: NEGATIVE mg/dL
Leukocytes, UA: NEGATIVE
Nitrite: NEGATIVE
Protein, ur: NEGATIVE mg/dL
Urobilinogen, UA: 4 mg/dL — ABNORMAL HIGH (ref 0.0–1.0)
pH: 6.5 (ref 5.0–8.0)

## 2010-11-26 LAB — URINE MICROSCOPIC-ADD ON

## 2010-11-26 LAB — FOLATE: Folate: 5.8 ng/mL

## 2010-11-26 LAB — IRON AND TIBC
Iron: 20 ug/dL — ABNORMAL LOW (ref 42–135)
Saturation Ratios: 10 % — ABNORMAL LOW (ref 20–55)
UIBC: 188 ug/dL (ref 125–400)

## 2010-11-26 LAB — VITAMIN B12: Vitamin B-12: 654 pg/mL (ref 211–911)

## 2010-11-26 LAB — MAGNESIUM: Magnesium: 1.6 mg/dL (ref 1.5–2.5)

## 2010-11-26 LAB — HEMOGLOBIN A1C: Hgb A1c MFr Bld: 6.4 % — ABNORMAL HIGH (ref <5.7)

## 2010-11-26 LAB — RETICULOCYTES: Retic Count, Absolute: 49.4 10*3/uL (ref 19.0–186.0)

## 2010-11-26 MED ORDER — ONDANSETRON HCL 4 MG PO TABS: 4.0000 mg | ORAL_TABLET | Freq: Four times a day (QID) | ORAL | Status: DC | PRN

## 2010-11-26 MED ORDER — HEPARIN BOLUS VIA INFUSION: 5000.0000 [IU] | Freq: Once | INTRAVENOUS | Status: AC

## 2010-11-26 MED ORDER — ASPIRIN EC 81 MG PO TBEC
81.0000 mg | DELAYED_RELEASE_TABLET | Freq: Every day | ORAL | Status: DC
  Administered 2010-11-26 – 2010-12-03 (×8): 81 mg via ORAL
  Filled 2010-11-26 (×8): qty 1

## 2010-11-26 MED ORDER — FLEET ENEMA 7-19 GM/118ML RE ENEM: 1.0000 | ENEMA | RECTAL | Status: DC | PRN

## 2010-11-26 MED ORDER — ACETAMINOPHEN 650 MG RE SUPP: 650.0000 mg | Freq: Four times a day (QID) | RECTAL | Status: DC | PRN

## 2010-11-26 MED ORDER — TRAZODONE HCL 50 MG PO TABS: 25.0000 mg | ORAL_TABLET | Freq: Every evening | ORAL | Status: DC | PRN

## 2010-11-26 MED ORDER — NICOTINE 14 MG/24HR TD PT24: 14.0000 mg | MEDICATED_PATCH | Freq: Every day | TRANSDERMAL | Status: DC | PRN

## 2010-11-26 MED ORDER — ACETAMINOPHEN 325 MG PO TABS
650.0000 mg | ORAL_TABLET | Freq: Four times a day (QID) | ORAL | Status: DC | PRN
  Administered 2010-11-26 – 2010-11-28 (×4): 650 mg via ORAL
  Filled 2010-11-26 (×4): qty 2

## 2010-11-26 MED ORDER — HEPARIN BOLUS VIA INFUSION
5000.0000 [IU] | Freq: Once | INTRAVENOUS | Status: AC
  Administered 2010-11-26: 5000 [IU] via INTRAVENOUS
  Filled 2010-11-26: qty 5000

## 2010-11-26 MED ORDER — SODIUM CHLORIDE 0.9 % IV SOLN
INTRAVENOUS | Status: AC
  Administered 2010-11-26: 21:00:00 via INTRAVENOUS
  Administered 2010-11-26: 100 mL via INTRAVENOUS

## 2010-11-26 MED ORDER — PNEUMOCOCCAL VAC POLYVALENT 25 MCG/0.5ML IJ INJ
0.5000 mL | INJECTION | INTRAMUSCULAR | Status: AC
  Administered 2010-11-27: 0.5 mL via INTRAMUSCULAR
  Filled 2010-11-26: qty 0.5

## 2010-11-26 MED ORDER — FOLIC ACID 1 MG PO TABS
1.0000 mg | ORAL_TABLET | Freq: Every day | ORAL | Status: DC
  Administered 2010-11-26 – 2010-12-03 (×8): 1 mg via ORAL
  Filled 2010-11-26 (×8): qty 1

## 2010-11-26 MED ORDER — ALBUTEROL SULFATE (5 MG/ML) 0.5% IN NEBU: 2.5000 mg | INHALATION_SOLUTION | RESPIRATORY_TRACT | Status: DC | PRN

## 2010-11-26 MED ORDER — HEPARIN (PORCINE) IN NACL 100-0.45 UNIT/ML-% IJ SOLN
1700.0000 [IU]/h | INTRAMUSCULAR | Status: DC
  Administered 2010-11-26 – 2010-11-27 (×2): 1600 [IU]/h via INTRAVENOUS
  Administered 2010-11-28: 1700 [IU]/h via INTRAVENOUS
  Administered 2010-11-28: 1600 [IU]/h via INTRAVENOUS
  Administered 2010-11-29 – 2010-11-30 (×3): 1700 [IU]/h via INTRAVENOUS
  Filled 2010-11-26 (×6): qty 250

## 2010-11-26 MED ORDER — ONDANSETRON HCL 4 MG/2ML IJ SOLN
4.0000 mg | Freq: Four times a day (QID) | INTRAMUSCULAR | Status: DC | PRN
  Administered 2010-11-27 – 2010-12-01 (×3): 4 mg via INTRAVENOUS
  Filled 2010-11-26 (×3): qty 2

## 2010-11-26 MED ORDER — ENOXAPARIN SODIUM 40 MG/0.4ML ~~LOC~~ SOLN: 40.0000 mg | SUBCUTANEOUS | Status: DC

## 2010-11-26 MED ORDER — VANCOMYCIN HCL 1000 MG IV SOLR
750.0000 mg | Freq: Two times a day (BID) | INTRAVENOUS | Status: DC
  Administered 2010-11-26 – 2010-11-28 (×5): 750 mg via INTRAVENOUS
  Filled 2010-11-26 (×7): qty 750

## 2010-11-26 MED ORDER — BISACODYL 10 MG RE SUPP: 10.0000 mg | RECTAL | Status: DC | PRN

## 2010-11-26 MED ORDER — METOPROLOL SUCCINATE ER 50 MG PO TB24
100.0000 mg | ORAL_TABLET | Freq: Every day | ORAL | Status: DC
  Administered 2010-11-26 – 2010-11-27 (×2): 100 mg via ORAL
  Filled 2010-11-26 (×2): qty 2

## 2010-11-26 MED ORDER — OXYCODONE HCL 5 MG PO TABS
5.0000 mg | ORAL_TABLET | ORAL | Status: DC | PRN
  Administered 2010-11-26 – 2010-12-02 (×3): 5 mg via ORAL
  Filled 2010-11-26 (×3): qty 1

## 2010-11-26 MED ORDER — MOXIFLOXACIN HCL IN NACL 400 MG/250ML IV SOLN
400.0000 mg | INTRAVENOUS | Status: DC
  Administered 2010-11-26 – 2010-11-28 (×3): 400 mg via INTRAVENOUS
  Filled 2010-11-26 (×4): qty 250

## 2010-11-26 MED ORDER — IPRATROPIUM BROMIDE 0.02 % IN SOLN: 0.5000 mg | RESPIRATORY_TRACT | Status: DC | PRN

## 2010-11-26 MED ORDER — LIDOCAINE HCL (PF) 1 % IJ SOLN
10.0000 mL | Freq: Once | INTRAMUSCULAR | Status: AC
  Administered 2010-11-26: 10 mL via INTRADERMAL

## 2010-11-26 MED ORDER — HEPARIN (PORCINE) IN NACL 100-0.45 UNIT/ML-% IJ SOLN: 18.0000 [IU]/kg/h | INTRAMUSCULAR | Status: DC

## 2010-11-26 MED ORDER — HYDROMORPHONE HCL 1 MG/ML IJ SOLN
0.5000 mg | INTRAMUSCULAR | Status: DC | PRN
  Administered 2010-11-27 – 2010-11-30 (×9): 0.5 mg via INTRAVENOUS
  Filled 2010-11-26 (×9): qty 1

## 2010-11-26 MED ORDER — DOCUSATE SODIUM 100 MG PO CAPS: 100.0000 mg | ORAL_CAPSULE | Freq: Two times a day (BID) | ORAL | Status: DC | PRN

## 2010-11-26 NOTE — Progress Notes (Signed)
ANTICOAGULATION CONSULT NOTE - Initial Consult  Pharmacy Consult for Heparin Indication: Subclavian vein thrombus  Allergies  Allergen Reactions  . Morphine   . Morphine And Related Itching    Patient Measurements: Height: 5' 7.5" (171.5 cm) Weight: 159 lb 13.3 oz (72.5 kg) IBW/kg (Calculated) : 62.75  Adjusted Body Weight:   Vital Signs: Temp: 99.2 F (37.3 C) (10/23 0541) Temp src: Oral (10/23 0541) BP: 113/68 mmHg (10/23 0541) Pulse Rate: 122  (10/23 0541)  Labs:  [name] 11/26/10 0744 11/26/10 0524 11/26/10 0110 11/25/10 1502  HGB -- 9.3* -- 10.7*  HCT -- 27.4* -- 31.8*  PLT -- 365 -- 400  APTT -- -- -- 41*  LABPROT -- -- -- 17.3*  INR -- -- -- 1.39  HEPARINUNFRC <0.10* -- -- --  CREATININE -- 0.87 0.88 0.87  CKTOTAL -- -- -- --  CKMB -- -- -- --  TROPONINI -- -- -- --   Estimated Creatinine Clearance: 63.1 ml/min (by C-G formula based on Cr of 0.87).  Medical History: Past Medical History  Diagnosis Date  . Breast cancer     Medications:  Scheduled:    . aspirin EC  81 mg Oral Daily  . folic acid  1 mg Oral Daily  . metoprolol  100 mg Oral Daily  . moxifloxacin  400 mg Intravenous Q24H  . oxyCODONE-acetaminophen  2 tablet Oral Once  . pneumococcal 23 valent vaccine  0.5 mL Intramuscular Tomorrow-1000  . potassium chloride  40 mEq Oral Once  . potassium chloride  10 mEq Intravenous Q1 Hr x 2  . potassium chloride  40 mEq Oral Q4H  . sodium chloride  1,000 mL Intravenous Once  . sodium chloride  500 mL Intravenous Once  . vancomycin  750 mg Intravenous Q12H  . vancomycin  1,000 mg Intravenous Once  . DISCONTD: enoxaparin  40 mg Subcutaneous Q24H   Infustions:    . heparin 1,000 Units/hr (11/25/10 2120)    Assessment:  Initial anticoagulation for subclavian thrombus. No significant risk factors for bleeding noted on medical history other than mild anemia. Heparin initiated on 11-25-10. Heparin level sub-therapeutic this morning.   Goal of  Therapy:  Heparin level 0.3-0.7 units/ml   Plan:  Re-bolus with 5000 units of heparin. Increase rate to 1300 units per hour. Recheck Heparin level in 6 hrs and daily.  Gilman Buttner, [place] J 11/26/2010,8:45 AM

## 2010-11-26 NOTE — Progress Notes (Signed)
Subjective: No increased SOB.  No change from yesterday.  Objective: Vital signs in last 24 hours: Temp:  [98.1 F (36.7 C)-99.2 F (37.3 C)] 98.1 F (36.7 C) (10/23 1300) Pulse Rate:  [96-122] 96  (10/23 1300) Resp:  [16-18] 16  (10/23 1300) BP: (100-113)/(63-68) 100/66 mmHg (10/23 1300) SpO2:  [94 %-95 %] 94 % (10/23 1300) FiO2 (%):  [97 %] 97 % (10/23 0037) Weight:  [72.5 kg (159 lb 13.3 oz)] 159 lb 13.3 oz (72.5 kg) (10/22 2312) Last BM Date: 11/25/10  Intake/Output from previous day: 10/22 0701 - 10/23 0700 In: -  Out: 3 [Urine:3] Intake/Output this shift: Total I/O In: 1448.1 [P.O.:840; I.V.:208.1; IV Piggyback:400] Out: 351 [Urine:350; Stool:1]  General appearance: alert and no distress Chest wall catheter L side of chest.    Lab Results:  @LABLAST2 (wbc:2,hgb:2,hct:2,plt:2) BMET  Basename 11/26/10 0524 11/26/10 0110 11/25/10 1502  NA 130* -- 129*  K 3.9 -- 2.8*  CL 95* -- 88*  CO2 25 -- 27  GLUCOSE 106* -- 115*  BUN 9 -- 10  CREATININE 0.87 0.88 --  CALCIUM 9.3 -- 9.9   PT/INR  Basename 11/25/10 1502  LABPROT 17.3*  INR 1.39   ABG No results found for this basename: PHART:2,PCO2:2,PO2:2,HCO3:2 in the last 72 hours  Studies/Results: Ct Angio Chest W/cm &/or Wo Cm  11/25/2010  *RADIOLOGY REPORT*  Clinical Data:  Shortness of breath.  Productive cough.  Back pain. History breast cancer.  CT ANGIOGRAPHY CHEST WITH CONTRAST  Technique:  Multidetector CT imaging of the chest was performed using the standard protocol during bolus administration of intravenous contrast.  Multiplanar CT image reconstructions including MIPs were obtained to evaluate the vascular anatomy.  Contrast: OMNIPAQUE IOHEXOL 350 MG/ML IV SOLN  Comparison:  Chest radiograph from 11/25/2010  Findings:  A left central line is noted with surrounding thrombus in the distal SVC. This nearly includes the distal SVC, with much of the contrast extending from the azygos and hemiazygous  systems and potentially up from the IVC, versus IVC reflux.  No abnormal contour of the interventricular septum is noted.  There is stranding around the axillary vein and left subclavian vein, which could possibly be thrombosed.  There is also some stranding around the brachiocephalic vein.  There is also an aberrant left subclavian artery which passes posterior to the esophagus.  Trace pericardial effusion noted.  No filling defect is identified in the pulmonary arterial tree to suggest pulmonary embolus.  A small subcutaneous nodular density measuring 0.8 x 0.5 cm in the right medial chest on image 45 of series 7 is probably incidental.  Mediastinal and subcarinal venous collaterals are present due to the SVC stenosis/occlusion.  Emphysema noted.  There is subsegmental atelectasis in the lingula and right lower lobe there is also mild atelectasis in the left lower lobe and right middle lobe.  No compelling findings of osseous metastatic disease noted.  Review of the MIP images confirms the above findings.  IMPRESSION:  1.  No embolus is observed. 2.  Fibrin sheath and probably thrombus in the distal SVC around the left subclavian catheter causes near occlusion of the SVC, with mediastinal collateral vessels noted with prominent azygos and hemiazygous contrast likely feeding into the inferior vena cava. There is abnormal stranding around the left axillary, brachial cephalic, and subclavian veins, possibly from thrombosis and inflammation.  Tumor recurrence is not excluded but is considered less likely given the constellation of findings.  I recommend left upper extremity venous ultrasound  for further workup. 3.  Aberrant right subclavian artery. 4.  Emphysema. 5.  Atelectasis in the lung bases. 6.  Trace pericardial effusion.  Original Report Authenticated By: Dellia Cloud, M.D.   Dg Chest Portable 1 View  11/25/2010  *RADIOLOGY REPORT*  Clinical Data: . Shortness of breath  PORTABLE CHEST - 1 VIEW   Comparison: 03/15/2004  Findings: The lungs are clear without focal infiltrate, edema, pneumothorax or pleural effusion. The cardiopericardial silhouette is within normal limits for size.  Right subclavian catheter tip projects at the mid SVC level.  Surgical clips are noted in the right axilla.  There is some minimal atelectasis at the lung bases.  IMPRESSION: No edema or focal airspace consolidation.  Original Report Authenticated By: ERIC A. MANSELL, M.D.    Anti-infectives: Anti-infectives     Start     Dose/Rate Route Frequency Ordered Stop   11/25/10 1745   vancomycin (VANCOCIN) IVPB 1000 mg/200 mL premix        1,000 mg 200 mL/hr over 60 Minutes Intravenous  Once 11/25/10 1732 11/25/10 2043          Assessment/Plan: s/p  SVC thrombus.  Now heprinized.  Continue medical treatment.  Catheter removed with sterile tech. at bedside.  Available as needed.  LOS: 1 day    Yahye Siebert C 11/26/2010

## 2010-11-26 NOTE — Progress Notes (Signed)
ANTICOAGULATION CONSULT NOTE - Consult  Pharmacy Consult for Heparin Indication: Subclavian vein thrombus  Allergies  Allergen Reactions  . Morphine   . Morphine And Related Itching    Patient Measurements: Height: 5' 7.5" (171.5 cm) Weight: 159 lb 13.3 oz (72.5 kg) IBW/kg (Calculated) : 62.75  Adjusted Body Weight:   Vital Signs: Temp: 98.1 F (36.7 C) (10/23 1300) Temp src: Oral (10/23 0541) BP: 100/66 mmHg (10/23 1300) Pulse Rate: 96  (10/23 1300)  Labs:  Basename 11/26/10 1550 11/26/10 0744 11/26/10 0524 11/26/10 0110 11/25/10 1502  HGB -- -- 9.3* -- 10.7*  HCT -- -- 27.4* -- 31.8*  PLT -- -- 365 -- 400  APTT -- -- -- -- 41*  LABPROT -- -- -- -- 17.3*  INR -- -- -- -- 1.39  HEPARINUNFRC 0.19* <0.10* -- -- --  CREATININE -- -- 0.87 0.88 0.87  CKTOTAL -- -- -- -- --  CKMB -- -- -- -- --  TROPONINI -- -- -- -- --   Estimated Creatinine Clearance: 63.1 ml/min (by C-G formula based on Cr of 0.87).  Medical History: Past Medical History  Diagnosis Date  . Breast cancer     Medications:  Scheduled:     . aspirin EC  81 mg Oral Daily  . folic acid  1 mg Oral Daily  . heparin  5,000 Units Intravenous Once  . lidocaine  10 mL Intradermal Once  . metoprolol  100 mg Oral Daily  . moxifloxacin  400 mg Intravenous Q24H  . pneumococcal 23 valent vaccine  0.5 mL Intramuscular Tomorrow-1000  . potassium chloride  40 mEq Oral Once  . potassium chloride  10 mEq Intravenous Q1 Hr x 2  . potassium chloride  40 mEq Oral Q4H  . sodium chloride  1,000 mL Intravenous Once  . sodium chloride  500 mL Intravenous Once  . vancomycin  750 mg Intravenous Q12H  . vancomycin  1,000 mg Intravenous Once  . DISCONTD: enoxaparin  40 mg Subcutaneous Q24H   Infustions:     . sodium chloride 100 mL (11/26/10 1623)  . heparin 18 Units/kg/hr (11/26/10 1031)  . DISCONTD: heparin 1,000 Units/hr (11/25/10 2120)    Assessment:  Initial anticoagulation for subclavian thrombus. No  significant risk factors for bleeding noted on medical history other than mild anemia. Heparin initiated on 11-25-10. Heparin level sub-therapeutic this morning.   Goal of Therapy:  Heparin level 0.3-0.7 units/ml   Plan:  Re-bolus with 5000 units of heparin. Increase rate to 1600 units per hour. Recheck Heparin level in 6 hrs and daily.  Gilman Buttner, [place] J 11/26/2010,5:24 PM

## 2010-11-26 NOTE — Consult Note (Signed)
Reason for Consult:SVC thrombus  Referring Physician: ED MD / [facility]  [name] is an 65 y.o. female.  HPI: Patient presented with CP some SOB.  ED MD concerned for PE and thus CT obtained.  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  When asked, patient states that the catheter was place by Dr. Katrinka Blazing in 2005 for her L mastectomy.  She states she has kept it clean.  She does remember somewhat that she was told to have the catheter removed after treatments.  She has not been seen in follow-up since CA went into remission.  Denies any facial or arm swelling.    Past Medical History  Diagnosis Date  . Breast cancer     Past Surgical History  Procedure Date  . Mastectomy   . Tumor removal from hip     No family history on file.  Social History:  reports that she has been smoking.  She does not have any smokeless tobacco history on file. She reports that she does not drink alcohol or use illicit drugs.  Allergies:  Allergies  Allergen Reactions  . Morphine   . Morphine And Related Itching    Medications:  Prior to Admission:  Prescriptions prior to admission  Medication Sig Dispense Refill  . aspirin EC 81 MG tablet Take 81 mg by mouth daily.        . metoprolol (TOPROL-XL) 100 MG 24 hr tablet Take 100 mg by mouth daily.         Scheduled:   . aspirin EC  81 mg Oral Daily  . folic acid  1 mg Oral Daily  . heparin  5,000 Units Intravenous Once  . lidocaine  10 mL Intradermal Once  . metoprolol  100 mg Oral Daily  . moxifloxacin  400 mg Intravenous Q24H  . oxyCODONE-acetaminophen  2 tablet Oral Once  . pneumococcal 23 valent vaccine  0.5 mL Intramuscular Tomorrow-1000  . potassium chloride  40 mEq Oral Once  . potassium chloride  10 mEq Intravenous Q1 Hr x 2  . potassium chloride  40 mEq Oral Q4H  . sodium chloride  1,000 mL Intravenous Once  . sodium chloride  500 mL Intravenous Once  . vancomycin  750 mg Intravenous Q12H  . vancomycin  1,000 mg  Intravenous Once  . DISCONTD: enoxaparin  40 mg Subcutaneous Q24H   Continuous:   . heparin 18 Units/kg/hr (11/26/10 1031)  . DISCONTD: heparin 1,000 Units/hr (11/25/10 2120)   ZOX:[id number], acetaminophen, albuterol, bisacodyl, docusate sodium, HYDROmorphone, iohexol, ipratropium, nicotine, ondansetron (ZOFRAN) IV, ondansetron, oxyCODONE, sodium phosphate, traZODone  Results for orders placed during the hospital encounter of 11/25/10 (from the past 48 hour(s))  BASIC METABOLIC PANEL     Status: Abnormal   Collection Time   11/25/10  3:02 PM      Component Value Range Comment   Sodium 129 (*) 135 - 145 (mEq/L)    Potassium 2.8 (*) 3.5 - 5.1 (mEq/L)    Chloride 88 (*) 96 - 112 (mEq/L)    CO2 27  19 - 32 (mEq/L)    Glucose, Bld 115 (*) 70 - 99 (mg/dL)    BUN 10  6 - 23 (mg/dL)    Creatinine, Ser 1.47  0.50 - 1.10 (mg/dL)    Calcium 9.9  8.4 - 10.5 (mg/dL)    GFR calc non Af Amer 68 (*) >90 (mL/min)    GFR calc Af Amer 79 (*) >90 (mL/min)  CBC     Status: Abnormal   Collection Time   11/25/10  3:02 PM      Component Value Range Comment   WBC 22.7 (*) 4.0 - 10.5 (K/uL)    RBC 3.59 (*) 3.87 - 5.11 (MIL/uL)    Hemoglobin 10.7 (*) 12.0 - 15.0 (g/dL)    HCT 16.1 (*) 09.6 - 46.0 (%)    MCV 88.6  78.0 - 100.0 (fL)    MCH 29.8  26.0 - 34.0 (pg)    MCHC 33.6  30.0 - 36.0 (g/dL)    RDW 04.5  40.9 - 81.1 (%)    Platelets 400  150 - 400 (K/uL)   DIFFERENTIAL     Status: Abnormal   Collection Time   11/25/10  3:02 PM      Component Value Range Comment   Neutrophils Relative 79 (*) 43 - 77 (%)    Lymphocytes Relative 14  12 - 46 (%)    Monocytes Relative 7  3 - 12 (%)    Eosinophils Relative 0  0 - 5 (%)    Basophils Relative 0  0 - 1 (%)    Neutro Abs 17.9 (*) 1.7 - 7.7 (K/uL)    Lymphs Abs 3.2  0.7 - 4.0 (K/uL)    Monocytes Absolute 1.6 (*) 0.1 - 1.0 (K/uL)    Eosinophils Absolute 0.0  0.0 - 0.7 (K/uL)    Basophils Absolute 0.0  0.0 - 0.1 (K/uL)    WBC Morphology ATYPICAL  LYMPHOCYTES      Smear Review LARGE PLATELETS PRESENT   GIANT PLATELETS SEEN  LACTIC ACID, PLASMA     Status: Normal   Collection Time   11/25/10  3:02 PM      Component Value Range Comment   Lactic Acid, Venous 1.5  0.5 - 2.2 (mmol/L)   PROTIME-INR     Status: Abnormal   Collection Time   11/25/10  3:02 PM      Component Value Range Comment   Prothrombin Time 17.3 (*) 11.6 - 15.2 (seconds)    INR 1.39  0.00 - 1.49    APTT     Status: Abnormal   Collection Time   11/25/10  3:02 PM      Component Value Range Comment   aPTT 41 (*) 24 - 37 (seconds)   CULTURE, BLOOD (ROUTINE X 2)     Status: Normal (Preliminary result)   Collection Time   11/25/10  3:03 PM      Component Value Range Comment   Specimen Description RIGHT ANTECUBITAL      Special Requests BOTTLES DRAWN AEROBIC AND ANAEROBIC 10CC      Culture        Value: GRAM POSITIVE COCCI IN CLUSTERS     Gram Stain Report Called to,Read Back By and Verified With: SURLES,MIRANDA AT 0725 ON 11/26/2010 BY RESSEGGER,R     Performed at [facility]   Report Status PENDING     CULTURE, BLOOD (ROUTINE X 2)     Status: Normal (Preliminary result)   Collection Time   11/25/10  3:15 PM      Component Value Range Comment   Specimen Description BLOOD LEFT ANTECUBITAL      Special Requests        Value: BOTTLES DRAWN AEROBIC AND ANAEROBIC AEB=12CC ANA=8CC   Culture        Value: GRAM POSITIVE COCCI IN CLUSTERS     Gram Stain Report Called to,Read Back By and  Verified With: Sophronia Simas RN ON [id number] AT 0725 BY RESSEGGER R     Performed at [facility]   Report Status PENDING     POCT I-STAT TROPONIN I     Status: Normal   Collection Time   11/25/10  3:17 PM      Component Value Range Comment   Troponin i, poc 0.03  0.00 - 0.08 (ng/mL)    Comment 3            CREATININE, SERUM     Status: Abnormal   Collection Time   11/26/10  1:10 AM      Component Value Range Comment   Creatinine, Ser 0.88  0.50 - 1.10 (mg/dL)     GFR calc non Af Amer 67 (*) >90 (mL/min)    GFR calc Af Amer 78 (*) >90 (mL/min)   HEPATIC FUNCTION PANEL     Status: Abnormal   Collection Time   11/26/10  1:10 AM      Component Value Range Comment   Total Protein 8.5 (*) 6.0 - 8.3 (g/dL)    Albumin 3.3 (*) 3.5 - 5.2 (g/dL)    AST 25  0 - 37 (U/L)    ALT 26  0 - 35 (U/L)    Alkaline Phosphatase 100  39 - 117 (U/L)    Total Bilirubin 0.6  0.3 - 1.2 (mg/dL)    Bilirubin, Direct 0.2  0.0 - 0.3 (mg/dL)    Indirect Bilirubin 0.4  0.3 - 0.9 (mg/dL)   MAGNESIUM     Status: Normal   Collection Time   11/26/10  1:10 AM      Component Value Range Comment   Magnesium 1.6  1.5 - 2.5 (mg/dL)   BASIC METABOLIC PANEL     Status: Abnormal   Collection Time   11/26/10  5:24 AM      Component Value Range Comment   Sodium 130 (*) 135 - 145 (mEq/L)    Potassium 3.9  3.5 - 5.1 (mEq/L) DELTA CHECK NOTED   Chloride 95 (*) 96 - 112 (mEq/L)    CO2 25  19 - 32 (mEq/L)    Glucose, Bld 106 (*) 70 - 99 (mg/dL)    BUN 9  6 - 23 (mg/dL)    Creatinine, Ser 9.56  0.50 - 1.10 (mg/dL)    Calcium 9.3  8.4 - 10.5 (mg/dL)    GFR calc non Af Amer 68 (*) >90 (mL/min)    GFR calc Af Amer 79 (*) >90 (mL/min)   CBC     Status: Abnormal   Collection Time   11/26/10  5:24 AM      Component Value Range Comment   WBC 20.1 (*) 4.0 - 10.5 (K/uL)    RBC 3.09 (*) 3.87 - 5.11 (MIL/uL)    Hemoglobin 9.3 (*) 12.0 - 15.0 (g/dL)    HCT 21.3 (*) 08.6 - 46.0 (%)    MCV 88.7  78.0 - 100.0 (fL)    MCH 30.1  26.0 - 34.0 (pg)    MCHC 33.9  30.0 - 36.0 (g/dL)    RDW 57.8  46.9 - 62.9 (%)    Platelets 365  150 - 400 (K/uL)   RETICULOCYTES     Status: Abnormal   Collection Time   11/26/10  5:24 AM      Component Value Range Comment   Retic Ct Pct 1.6  0.4 - 3.1 (%)    RBC. 3.09 (*) 3.87 - 5.11 (MIL/uL)  Retic Count, Manual 49.4  19.0 - 186.0 (K/uL)   HEPARIN LEVEL     Status: Abnormal   Collection Time   11/26/10  7:44 AM      Component Value Range Comment   Heparin  Unfractionated <0.10 (*) 0.30 - 0.70 (IU/mL)   URINALYSIS, ROUTINE W REFLEX MICROSCOPIC     Status: Abnormal   Collection Time   11/26/10 11:05 AM      Component Value Range Comment   Color, Urine YELLOW  YELLOW     Appearance CLEAR  CLEAR     Specific Gravity, Urine <1.005 (*) 1.005 - 1.030     pH 6.5  5.0 - 8.0     Glucose, UA NEGATIVE  NEGATIVE (mg/dL)    Hgb urine dipstick SMALL (*) NEGATIVE     Bilirubin Urine NEGATIVE  NEGATIVE     Ketones, ur NEGATIVE  NEGATIVE (mg/dL)    Protein, ur NEGATIVE  NEGATIVE (mg/dL)    Urobilinogen, UA 4.0 (*) 0.0 - 1.0 (mg/dL)    Nitrite NEGATIVE  NEGATIVE     Leukocytes, UA NEGATIVE  NEGATIVE    URINE MICROSCOPIC-ADD ON     Status: Normal   Collection Time   11/26/10 11:05 AM      Component Value Range Comment   Squamous Epithelial / LPF RARE  RARE     WBC, UA 3-6  <3 (WBC/hpf)    RBC / HPF 0-2  <3 (RBC/hpf)    Bacteria, UA RARE  RARE      Ct Angio Chest W/cm &/or Wo Cm  11/25/2010  *RADIOLOGY REPORT*  Clinical Data:  Shortness of breath.  Productive cough.  Back pain. History breast cancer.  CT ANGIOGRAPHY CHEST WITH CONTRAST  Technique:  Multidetector CT imaging of the chest was performed using the standard protocol during bolus administration of intravenous contrast.  Multiplanar CT image reconstructions including MIPs were obtained to evaluate the vascular anatomy.  Contrast: OMNIPAQUE IOHEXOL 350 MG/ML IV SOLN  Comparison:  Chest radiograph from 11/25/2010  Findings:  A left central line is noted with surrounding thrombus in the distal SVC. This nearly includes the distal SVC, with much of the contrast extending from the azygos and hemiazygous systems and potentially up from the IVC, versus IVC reflux.  No abnormal contour of the interventricular septum is noted.  There is stranding around the axillary vein and left subclavian vein, which could possibly be thrombosed.  There is also some stranding around the brachiocephalic vein.  There is  also an aberrant left subclavian artery which passes posterior to the esophagus.  Trace pericardial effusion noted.  No filling defect is identified in the pulmonary arterial tree to suggest pulmonary embolus.  A small subcutaneous nodular density measuring 0.8 x 0.5 cm in the right medial chest on image 45 of series 7 is probably incidental.  Mediastinal and subcarinal venous collaterals are present due to the SVC stenosis/occlusion.  Emphysema noted.  There is subsegmental atelectasis in the lingula and right lower lobe there is also mild atelectasis in the left lower lobe and right middle lobe.  No compelling findings of osseous metastatic disease noted.  Review of the MIP images confirms the above findings.  IMPRESSION:  1.  No embolus is observed. 2.  Fibrin sheath and probably thrombus in the distal SVC around the left subclavian catheter causes near occlusion of the SVC, with mediastinal collateral vessels noted with prominent azygos and hemiazygous contrast likely feeding into the inferior vena cava. There  is abnormal stranding around the left axillary, brachial cephalic, and subclavian veins, possibly from thrombosis and inflammation.  Tumor recurrence is not excluded but is considered less likely given the constellation of findings.  I recommend left upper extremity venous ultrasound for further workup. 3.  Aberrant right subclavian artery. 4.  Emphysema. 5.  Atelectasis in the lung bases. 6.  Trace pericardial effusion.  Original Report Authenticated By: Dellia Cloud, M.D.   Dg Chest Portable 1 View  11/25/2010  *RADIOLOGY REPORT*  Clinical Data: . Shortness of breath  PORTABLE CHEST - 1 VIEW  Comparison: 03/15/2004  Findings: The lungs are clear without focal infiltrate, edema, pneumothorax or pleural effusion. The cardiopericardial silhouette is within normal limits for size.  Right subclavian catheter tip projects at the mid SVC level.  Surgical clips are noted in the right axilla.  There  is some minimal atelectasis at the lung bases.  IMPRESSION: No edema or focal airspace consolidation.  Original Report Authenticated By: ERIC A. MANSELL, M.D.    Review of Systems  Constitutional: Negative for fever, chills and diaphoresis.  HENT: Negative.   Respiratory: Negative.   Cardiovascular: Positive for chest pain. Negative for palpitations.  Gastrointestinal: Negative.   Skin: Negative.    Blood pressure 113/68, pulse 122, temperature 99.2 F (37.3 C), temperature source Oral, resp. rate 18, height 5' 7.5" (1.715 m), weight 72.5 kg (159 lb 13.3 oz), SpO2 95.00%. Physical Exam  Constitutional: She is oriented to person, place, and time. She appears well-developed.       Slightly dissheveled  HENT:  Head: Normocephalic.  Eyes: Conjunctivae are normal. Pupils are equal, round, and reactive to light. No scleral icterus.  Neck: No thyromegaly present.  Cardiovascular: Normal rate and regular rhythm.   Respiratory: Effort normal and breath sounds normal.  GI: Soft. Bowel sounds are normal.  Lymphadenopathy:    She has no cervical adenopathy.  Neurological: She is alert and oriented to person, place, and time.  Skin:       Chest wall demonstates a dual lumen external catheter.  Tender to palpation around skin insertion site.  Courses up to clavicle on L side.    Assessment/Plan: SVC thrombus.  Start anti-coag.  Will need catheter removed.  Discussed risks with patient.Will proceed under local.  Morgana Rowley C 11/26/2010, 1:03 PM

## 2010-11-26 NOTE — Progress Notes (Signed)
*  PRELIMINARY RESULTS* Echocardiogram 2D Echocardiogram has been performed.  [name] 11/26/2010, 2:52 PM

## 2010-11-26 NOTE — Progress Notes (Signed)
Subjective:  Patient denies any chest pain any shortness of breath. She has had a low-grade fever overnight. She now has positive blood cultures. When she denies any chills or rigors or cough.  Objective: Vital signs in last 24 hours: Filed Vitals:   11/25/10 1734 11/25/10 2312 11/26/10 0037 11/26/10 0541  BP: 103/63 103/67  113/68  Pulse: 102 117 99 122  Temp:  98.8 F (37.1 C)  99.2 F (37.3 C)  TempSrc:  Oral  Oral  Resp: 18 18  18   Height:  5' 7.5" (1.715 m)    Weight:  72.5 kg (159 lb 13.3 oz)    SpO2: 94% 94%  95%    Intake/Output Summary (Last 24 hours) at 11/26/10 1329 Last data filed at 11/26/10 0542  Gross per 24 hour  Intake      0 ml  Output      3 ml  Net     -3 ml    Weight change:  General: Alert, awake, oriented x3, in no acute distress. HEENT: No bruits, no goiter. Heart: Regular rate and rhythm, without murmurs, rubs, gallops. Lungs: Clear to auscultation bilaterally. Abdomen: Soft, nontender, nondistended, positive bowel sounds. Extremities: No clubbing cyanosis or edema with positive pedal pulses. Neuro: Grossly intact, nonfocal.    Lab Results: Results for orders placed during the hospital encounter of 11/25/10 (from the past 24 hour(s))  BASIC METABOLIC PANEL     Status: Abnormal   Collection Time   11/25/10  3:02 PM      Component Value Range   Sodium 129 (*) 135 - 145 (mEq/L)   Potassium 2.8 (*) 3.5 - 5.1 (mEq/L)   Chloride 88 (*) 96 - 112 (mEq/L)   CO2 27  19 - 32 (mEq/L)   Glucose, Bld 115 (*) 70 - 99 (mg/dL)   BUN 10  6 - 23 (mg/dL)   Creatinine, Ser 6.04  0.50 - 1.10 (mg/dL)   Calcium 9.9  8.4 - 54.0 (mg/dL)   GFR calc non Af Amer 68 (*) >90 (mL/min)   GFR calc Af Amer 79 (*) >90 (mL/min)  CBC     Status: Abnormal   Collection Time   11/25/10  3:02 PM      Component Value Range   WBC 22.7 (*) 4.0 - 10.5 (K/uL)   RBC 3.59 (*) 3.87 - 5.11 (MIL/uL)   Hemoglobin 10.7 (*) 12.0 - 15.0 (g/dL)   HCT 98.1 (*) 19.1 - 46.0 (%)   MCV 88.6   78.0 - 100.0 (fL)   MCH 29.8  26.0 - 34.0 (pg)   MCHC 33.6  30.0 - 36.0 (g/dL)   RDW 47.8  29.5 - 62.1 (%)   Platelets 400  150 - 400 (K/uL)  DIFFERENTIAL     Status: Abnormal   Collection Time   11/25/10  3:02 PM      Component Value Range   Neutrophils Relative 79 (*) 43 - 77 (%)   Lymphocytes Relative 14  12 - 46 (%)   Monocytes Relative 7  3 - 12 (%)   Eosinophils Relative 0  0 - 5 (%)   Basophils Relative 0  0 - 1 (%)   Neutro Abs 17.9 (*) 1.7 - 7.7 (K/uL)   Lymphs Abs 3.2  0.7 - 4.0 (K/uL)   Monocytes Absolute 1.6 (*) 0.1 - 1.0 (K/uL)   Eosinophils Absolute 0.0  0.0 - 0.7 (K/uL)   Basophils Absolute 0.0  0.0 - 0.1 (K/uL)   WBC Morphology ATYPICAL  LYMPHOCYTES     Smear Review LARGE PLATELETS PRESENT    LACTIC ACID, PLASMA     Status: Normal   Collection Time   11/25/10  3:02 PM      Component Value Range   Lactic Acid, Venous 1.5  0.5 - 2.2 (mmol/L)  PROTIME-INR     Status: Abnormal   Collection Time   11/25/10  3:02 PM      Component Value Range   Prothrombin Time 17.3 (*) 11.6 - 15.2 (seconds)   INR 1.39  0.00 - 1.49   APTT     Status: Abnormal   Collection Time   11/25/10  3:02 PM      Component Value Range   aPTT 41 (*) 24 - 37 (seconds)  CULTURE, BLOOD (ROUTINE X 2)     Status: Normal (Preliminary result)   Collection Time   11/25/10  3:03 PM      Component Value Range   Specimen Description RIGHT ANTECUBITAL     Special Requests BOTTLES DRAWN AEROBIC AND ANAEROBIC 10CC     Culture       Value: GRAM POSITIVE COCCI IN CLUSTERS     Gram Stain Report Called to,Read Back By and Verified With: SURLES,MIRANDA AT 0725 ON 11/26/2010 BY RESSEGGER,R     Performed at [facility]   Report Status PENDING    CULTURE, BLOOD (ROUTINE X 2)     Status: Normal (Preliminary result)   Collection Time   11/25/10  3:15 PM      Component Value Range   Specimen Description BLOOD LEFT ANTECUBITAL     Special Requests       Value: BOTTLES DRAWN AEROBIC AND ANAEROBIC  AEB=12CC ANA=8CC   Culture       Value: GRAM POSITIVE COCCI IN CLUSTERS     Gram Stain Report Called to,Read Back By and Verified With: Sophronia Simas RN ON [id number] AT 0725 BY RESSEGGER R     Performed at [facility]   Report Status PENDING    POCT I-STAT TROPONIN I     Status: Normal   Collection Time   11/25/10  3:17 PM      Component Value Range   Troponin i, poc 0.03  0.00 - 0.08 (ng/mL)   Comment 3           CREATININE, SERUM     Status: Abnormal   Collection Time   11/26/10  1:10 AM      Component Value Range   Creatinine, Ser 0.88  0.50 - 1.10 (mg/dL)   GFR calc non Af Amer 67 (*) >90 (mL/min)   GFR calc Af Amer 78 (*) >90 (mL/min)  HEPATIC FUNCTION PANEL     Status: Abnormal   Collection Time   11/26/10  1:10 AM      Component Value Range   Total Protein 8.5 (*) 6.0 - 8.3 (g/dL)   Albumin 3.3 (*) 3.5 - 5.2 (g/dL)   AST 25  0 - 37 (U/L)   ALT 26  0 - 35 (U/L)   Alkaline Phosphatase 100  39 - 117 (U/L)   Total Bilirubin 0.6  0.3 - 1.2 (mg/dL)   Bilirubin, Direct 0.2  0.0 - 0.3 (mg/dL)   Indirect Bilirubin 0.4  0.3 - 0.9 (mg/dL)  MAGNESIUM     Status: Normal   Collection Time   11/26/10  1:10 AM      Component Value Range   Magnesium 1.6  1.5 - 2.5 (  mg/dL)  BASIC METABOLIC PANEL     Status: Abnormal   Collection Time   11/26/10  5:24 AM      Component Value Range   Sodium 130 (*) 135 - 145 (mEq/L)   Potassium 3.9  3.5 - 5.1 (mEq/L)   Chloride 95 (*) 96 - 112 (mEq/L)   CO2 25  19 - 32 (mEq/L)   Glucose, Bld 106 (*) 70 - 99 (mg/dL)   BUN 9  6 - 23 (mg/dL)   Creatinine, Ser 4.09  0.50 - 1.10 (mg/dL)   Calcium 9.3  8.4 - 81.1 (mg/dL)   GFR calc non Af Amer 68 (*) >90 (mL/min)   GFR calc Af Amer 79 (*) >90 (mL/min)  CBC     Status: Abnormal   Collection Time   11/26/10  5:24 AM      Component Value Range   WBC 20.1 (*) 4.0 - 10.5 (K/uL)   RBC 3.09 (*) 3.87 - 5.11 (MIL/uL)   Hemoglobin 9.3 (*) 12.0 - 15.0 (g/dL)   HCT 91.4 (*) 78.2 - 46.0 (%)   MCV 88.7   78.0 - 100.0 (fL)   MCH 30.1  26.0 - 34.0 (pg)   MCHC 33.9  30.0 - 36.0 (g/dL)   RDW 95.6  21.3 - 08.6 (%)   Platelets 365  150 - 400 (K/uL)  RETICULOCYTES     Status: Abnormal   Collection Time   11/26/10  5:24 AM      Component Value Range   Retic Ct Pct 1.6  0.4 - 3.1 (%)   RBC. 3.09 (*) 3.87 - 5.11 (MIL/uL)   Retic Count, Manual 49.4  19.0 - 186.0 (K/uL)  HEPARIN LEVEL     Status: Abnormal   Collection Time   11/26/10  7:44 AM      Component Value Range   Heparin Unfractionated <0.10 (*) 0.30 - 0.70 (IU/mL)  URINALYSIS, ROUTINE W REFLEX MICROSCOPIC     Status: Abnormal   Collection Time   11/26/10 11:05 AM      Component Value Range   Color, Urine YELLOW  YELLOW    Appearance CLEAR  CLEAR    Specific Gravity, Urine <1.005 (*) 1.005 - 1.030    pH 6.5  5.0 - 8.0    Glucose, UA NEGATIVE  NEGATIVE (mg/dL)   Hgb urine dipstick SMALL (*) NEGATIVE    Bilirubin Urine NEGATIVE  NEGATIVE    Ketones, ur NEGATIVE  NEGATIVE (mg/dL)   Protein, ur NEGATIVE  NEGATIVE (mg/dL)   Urobilinogen, UA 4.0 (*) 0.0 - 1.0 (mg/dL)   Nitrite NEGATIVE  NEGATIVE    Leukocytes, UA NEGATIVE  NEGATIVE   URINE MICROSCOPIC-ADD ON     Status: Normal   Collection Time   11/26/10 11:05 AM      Component Value Range   Squamous Epithelial / LPF RARE  RARE    WBC, UA 3-6  <3 (WBC/hpf)   RBC / HPF 0-2  <3 (RBC/hpf)   Bacteria, UA RARE  RARE      Micro: Recent Results (from the past 240 hour(s))  CULTURE, BLOOD (ROUTINE X 2)     Status: Normal (Preliminary result)   Collection Time   11/25/10  3:03 PM      Component Value Range Status Comment   Specimen Description RIGHT ANTECUBITAL   Final    Special Requests BOTTLES DRAWN AEROBIC AND ANAEROBIC 10CC   Final    Culture     Final    Value:  GRAM POSITIVE COCCI IN CLUSTERS     Gram Stain Report Called to,Read Back By and Verified With: SURLES,MIRANDA AT 0725 ON 11/26/2010 BY RESSEGGER,R     Performed at [facility]   Report Status PENDING    Incomplete   CULTURE, BLOOD (ROUTINE X 2)     Status: Normal (Preliminary result)   Collection Time   11/25/10  3:15 PM      Component Value Range Status Comment   Specimen Description BLOOD LEFT ANTECUBITAL   Final    Special Requests     Final    Value: BOTTLES DRAWN AEROBIC AND ANAEROBIC AEB=12CC ANA=8CC   Culture     Final    Value: GRAM POSITIVE COCCI IN CLUSTERS     Gram Stain Report Called to,Read Back By and Verified With: Sophronia Simas RN ON [id number] AT 0725 BY RESSEGGER R     Performed at [facility]   Report Status PENDING   Incomplete     Studies/Results: Ct Angio Chest W/cm &/or Wo Cm  11/25/2010  *RADIOLOGY REPORT*   IMPRESSION:  1.  No embolus is observed. 2.  Fibrin sheath and probably thrombus in the distal SVC around the left subclavian catheter causes near occlusion of the SVC, with mediastinal collateral vessels noted with prominent azygos and hemiazygous contrast likely feeding into the inferior vena cava. There is abnormal stranding around the left axillary, brachial cephalic, and subclavian veins, possibly from thrombosis and inflammation.  Tumor recurrence is not excluded but is considered less likely given the constellation of findings.  I recommend left upper extremity venous ultrasound for further workup. 3.  Aberrant right subclavian artery. 4.  Emphysema. 5.  Atelectasis in the lung bases. 6.  Trace pericardial effusion.  Original Report Authenticated By: Dellia Cloud, M.D.   Dg Chest Portable 1 View  11/25/2010  *RADIOLOGY REPORT*    IMPRESSION: No edema or focal airspace consolidation.  Original Report Authenticated By: ERIC A. MANSELL, M.D.    Medications:     . aspirin EC  81 mg Oral Daily  . folic acid  1 mg Oral Daily  . heparin  5,000 Units Intravenous Once  . lidocaine  10 mL Intradermal Once  . metoprolol  100 mg Oral Daily  . moxifloxacin  400 mg Intravenous Q24H  . oxyCODONE-acetaminophen  2 tablet Oral Once  . pneumococcal 23  valent vaccine  0.5 mL Intramuscular Tomorrow-1000  . potassium chloride  40 mEq Oral Once  . potassium chloride  10 mEq Intravenous Q1 Hr x 2  . potassium chloride  40 mEq Oral Q4H  . sodium chloride  1,000 mL Intravenous Once  . sodium chloride  500 mL Intravenous Once  . vancomycin  750 mg Intravenous Q12H  . vancomycin  1,000 mg Intravenous Once  . DISCONTD: enoxaparin  40 mg Subcutaneous Q24H    AssessmentPLan Principal Problem:  Superior vena cava obstruction with collaterals: The patient is currently on a heparin drip,per  vascular surgery recommendations the patient will have her left subclavian catheter removed by surgery. The patient will subsequently be started on Coumadin and will need anticoagulation for at least 6 months.  #2 positive blood cultures: The patient is currently on vancomycin and Avelox. The patient would need a transesophageal echo to rule out endocarditis. Because of her positive blood cultures. We will start with a transthoracic echo and consult cardiology for possible TEE.  #3 sinus tachycardia : Likely secondary to her sepsis. She does not  have a pulmonary embolism.   ANXIETY: Stable  HYPERTENSION: Stable  COPD  BREAST CANCER, HX OF  Shoulder region pain  Tobacco abuse  Hypokalemia: Repleted  Anemia: Stable        LOS: 1 day   [name] 11/26/2010, 1:29 PM

## 2010-11-26 NOTE — Progress Notes (Signed)
ANTIBIOTIC CONSULT NOTE - INITIAL  Pharmacy Consult for  Vancomycin Indication: rule out pneumonia  Allergies  Allergen Reactions  . Morphine   . Morphine And Related Itching    Patient Measurements: Height: 5' 7.5" (171.5 cm) Weight: 159 lb 13.3 oz (72.5 kg) IBW/kg (Calculated) : 62.75  Adjusted Body Weight: n/a  Vital Signs: Temp: 99.2 F (37.3 C) (10/23 0541) Temp src: Oral (10/23 0541) BP: 113/68 mmHg (10/23 0541) Pulse Rate: 122  (10/23 0541) Intake/Output from previous day: 10/22 0701 - 10/23 0700 In: -  Out: 3 [Urine:3] Intake/Output from this shift:    Labs:  Basename 11/26/10 0524 11/26/10 0110 11/25/10 1502  WBC 20.1* -- 22.7*  HGB 9.3* -- 10.7*  PLT 365 -- 400  LABCREA -- -- --  CREATININE 0.87 0.88 0.87   Estimated Creatinine Clearance: 63.1 ml/min (by C-G formula based on Cr of 0.87).    Microbiology: No results found for this or any previous visit (from the past 720 hour(s)).  Medical History: Past Medical History  Diagnosis Date  . Breast cancer     Medications:   Also on Moxifloxacin.  Assessment:  Empiric therapy. Received 1 gram IV on 11-25-10.  Goal of Therapy:  Vancomycin trough level 15-20 mcg/ml  Plan:  Measure antibiotic drug levels at steady state Follow up culture results Vancomycin 750 mg IV every 12 hours.  Gilman Buttner, [place] J 11/26/2010,8:24 AM

## 2010-11-27 LAB — CBC
HCT: 27.3 % — ABNORMAL LOW (ref 36.0–46.0)
Hemoglobin: 9.1 g/dL — ABNORMAL LOW (ref 12.0–15.0)
MCH: 30 pg (ref 26.0–34.0)
MCHC: 33.3 g/dL (ref 30.0–36.0)

## 2010-11-27 LAB — VANCOMYCIN, TROUGH: Vancomycin Tr: 15.5 ug/mL (ref 10.0–20.0)

## 2010-11-27 MED ORDER — METOPROLOL SUCCINATE ER 50 MG PO TB24
100.0000 mg | ORAL_TABLET | Freq: Every day | ORAL | Status: DC
  Administered 2010-11-28 – 2010-12-03 (×6): 100 mg via ORAL
  Filled 2010-11-27 (×2): qty 2
  Filled 2010-11-27: qty 1
  Filled 2010-11-27: qty 2
  Filled 2010-11-27: qty 1
  Filled 2010-11-27 (×2): qty 2

## 2010-11-27 MED ORDER — SODIUM CHLORIDE 0.9 % IV SOLN
INTRAVENOUS | Status: DC
  Administered 2010-11-27: 14:00:00 via INTRAVENOUS

## 2010-11-27 MED ORDER — WARFARIN SODIUM 5 MG PO TABS
5.0000 mg | ORAL_TABLET | Freq: Once | ORAL | Status: AC
  Administered 2010-11-27: 5 mg via ORAL
  Filled 2010-11-27: qty 1

## 2010-11-27 MED ORDER — BIOTENE DRY MOUTH MT LIQD
Freq: Two times a day (BID) | OROMUCOSAL | Status: DC
  Administered 2010-11-27: 1 via OROMUCOSAL
  Administered 2010-11-27 – 2010-11-28 (×2): via OROMUCOSAL
  Administered 2010-11-28: 1 via OROMUCOSAL
  Administered 2010-11-29 (×2): via OROMUCOSAL
  Administered 2010-11-30: 1 via OROMUCOSAL
  Administered 2010-11-30 – 2010-12-03 (×5): via OROMUCOSAL

## 2010-11-27 NOTE — Progress Notes (Signed)
ANTICOAGULATION CONSULT NOTE   Pharmacy Consult for Heparin Indication: Subclavian vein thrombus  Allergies  Allergen Reactions  . Morphine   . Morphine And Related Itching   Patient Measurements: Height: 5' 7.5" (171.5 cm) Weight: 161 lb 6 oz (73.2 kg) IBW/kg (Calculated) : 62.75  Adjusted Body Weight:   Vital Signs: Temp: 98.4 F (36.9 C) (10/24 0631) Temp src: Oral (10/24 0631) BP: 100/65 mmHg (10/24 0631) Pulse Rate: 97  (10/24 0631)  Labs:  [name] 11/27/10 0754 11/27/10 0452 11/26/10 2307 11/26/10 1550 11/26/10 0524 11/26/10 0110 11/25/10 1502  HGB -- 9.1* -- -- 9.3* -- --  HCT -- 27.3* -- -- 27.4* -- 31.8*  PLT -- 379 -- -- 365 -- 400  APTT -- -- -- -- -- -- 41*  LABPROT 16.8* -- -- -- -- -- 17.3*  INR 1.34 -- -- -- -- -- 1.39  HEPARINUNFRC -- 0.47 0.60 0.19* -- -- --  CREATININE -- -- -- -- 0.87 0.88 0.87  CKTOTAL -- -- -- -- -- -- --  CKMB -- -- -- -- -- -- --  TROPONINI -- -- -- -- -- -- --   Estimated Creatinine Clearance: 63.1 ml/min (by C-G formula based on Cr of 0.87).  Medical History: Past Medical History  Diagnosis Date  . Breast cancer    Medications:  Scheduled:     . antiseptic oral rinse   Mouth Rinse BID  . aspirin EC  81 mg Oral Daily  . folic acid  1 mg Oral Daily  . heparin  5,000 Units Intravenous Once  . heparin  5,000 Units Intravenous Once  . lidocaine  10 mL Intradermal Once  . metoprolol  100 mg Oral Daily  . moxifloxacin  400 mg Intravenous Q24H  . pneumococcal 23 valent vaccine  0.5 mL Intramuscular Tomorrow-1000  . sodium chloride  500 mL Intravenous Once  . vancomycin  750 mg Intravenous Q12H   Infustions:     . sodium chloride 100 mL/hr at 11/26/10 2054  . heparin 16 mL/hr (11/27/10 0200)  . DISCONTD: heparin 1,000 Units/hr (11/25/10 2120)  . DISCONTD: heparin 18 Units/kg/hr (11/26/10 1031)   Assessment:  Initial anticoagulation for subclavian thrombus. No significant risk factors for bleeding noted on medical  history other than mild anemia. Heparin initiated on 11-25-10. Heparin level therapeutic today. titrating Warfarin for INR 2-3  Goal of Therapy:  Heparin level 0.3-0.7 units/ml   Plan: Continue heparin at 1600 units/hr Coumadin 5mg  today Heparin level daily INR daily  Margo Aye, [name] 11/27/2010,8:30 AM

## 2010-11-27 NOTE — Progress Notes (Signed)
Subjective:  Patient denies any chest pain or shortness of breath. Patient feels a lot better today. She is without any significant complaints.  Objective: Vital signs in last 24 hours: Filed Vitals:   11/27/10 0930 11/27/10 0959 11/27/10 1100 11/27/10 1224  BP: 96/58  87/52 84/52  Pulse: 94 92 92 91  Temp:   98.4 F (36.9 C)   TempSrc:   Oral   Resp:   20   Height:      Weight:      SpO2:  90% 92%     Intake/Output Summary (Last 24 hours) at 11/27/10 1356 Last data filed at 11/27/10 4540  Gross per 24 hour  Intake 2117.38 ml  Output   1200 ml  Net 917.38 ml    Weight change: 2.257 kg (4 lb 15.6 oz) General: Alert, awake, oriented x3, in no acute distress. HEENT: No bruits, no goiter. Heart: Regular rate and rhythm, without murmurs, rubs, gallops. Lungs: Clear to auscultation bilaterally. Abdomen: Soft, nontender, nondistended, positive bowel sounds. Extremities: No clubbing cyanosis or edema with positive pedal pulses. Neuro: Grossly intact, nonfocal.    Lab Results: Results for orders placed during the hospital encounter of 11/25/10 (from the past 24 hour(s))  HEPARIN LEVEL     Status: Abnormal   Collection Time   11/26/10  3:50 PM      Component Value Range   Heparin Unfractionated 0.19 (*) 0.30 - 0.70 (IU/mL)  HEPARIN LEVEL     Status: Normal   Collection Time   11/26/10 11:07 PM      Component Value Range   Heparin Unfractionated 0.60  0.30 - 0.70 (IU/mL)  HEPARIN LEVEL     Status: Normal   Collection Time   11/27/10  4:52 AM      Component Value Range   Heparin Unfractionated 0.47  0.30 - 0.70 (IU/mL)  CBC     Status: Abnormal   Collection Time   11/27/10  4:52 AM      Component Value Range   WBC 18.2 (*) 4.0 - 10.5 (K/uL)   RBC 3.03 (*) 3.87 - 5.11 (MIL/uL)   Hemoglobin 9.1 (*) 12.0 - 15.0 (g/dL)   HCT 98.1 (*) 19.1 - 46.0 (%)   MCV 90.1  78.0 - 100.0 (fL)   MCH 30.0  26.0 - 34.0 (pg)   MCHC 33.3  30.0 - 36.0 (g/dL)   RDW 47.8  29.5 - 62.1 (%)     Platelets 379  150 - 400 (K/uL)  VANCOMYCIN, TROUGH     Status: Normal   Collection Time   11/27/10  7:40 AM      Component Value Range   Vancomycin Tr 15.5  10.0 - 20.0 (ug/mL)  PROTIME-INR     Status: Abnormal   Collection Time   11/27/10  7:54 AM      Component Value Range   Prothrombin Time 16.8 (*) 11.6 - 15.2 (seconds)   INR 1.34  0.00 - 1.49      Micro: Recent Results (from the past 240 hour(s))  CULTURE, BLOOD (ROUTINE X 2)     Status: Normal (Preliminary result)   Collection Time   11/25/10  3:03 PM      Component Value Range Status Comment   Specimen Description RIGHT ANTECUBITAL   Final    Special Requests BOTTLES DRAWN AEROBIC AND ANAEROBIC 10CC   Final    Setup Time [id number]   Final    Culture     Final  Value: STAPHYLOCOCCUS AUREUS     Note: Gram Stain Report Called to,Read Back By and Verified With: SURLES MIRANDA RN ON 11/26/10 AT 0725 BY RESSEGGER R  Performed at [facility]   Report Status PENDING   Incomplete   CULTURE, BLOOD (ROUTINE X 2)     Status: Normal (Preliminary result)   Collection Time   11/25/10  3:15 PM      Component Value Range Status Comment   Specimen Description BLOOD LEFT ANTECUBITAL   Final    Special Requests     Final    Value: BOTTLES DRAWN AEROBIC AND ANAEROBIC AEB=12CC ANA=8CC   Setup Time [id number]   Final    Culture     Final    Value: STAPHYLOCOCCUS AUREUS     Note: RIFAMPIN AND GENTAMICIN SHOULD NOT BE USED AS SINGLE DRUGS FOR TREATMENT OF STAPH INFECTIONS.     Note: Gram Stain Report Called to,Read Back By and Verified With: Sophronia Simas RN ON [phone] AT 0725 BY RESSEGGER R Performed at [facility]   Report Status PENDING   Incomplete     Studies/Results: Ct Angio Chest W/cm &/or Wo Cm  11/25/2010  *RADIOLOGY REPORT*   IMPRESSION:  1.  No embolus is observed. 2.  Fibrin sheath and probably thrombus in the distal SVC around the left subclavian catheter causes near occlusion of the SVC, with  mediastinal collateral vessels noted with prominent azygos and hemiazygous contrast likely feeding into the inferior vena cava. There is abnormal stranding around the left axillary, brachial cephalic, and subclavian veins, possibly from thrombosis and inflammation.  Tumor recurrence is not excluded but is considered less likely given the constellation of findings.  I recommend left upper extremity venous ultrasound for further workup. 3.  Aberrant right subclavian artery. 4.  Emphysema. 5.  Atelectasis in the lung bases. 6.  Trace pericardial effusion.  Original Report Authenticated By: Dellia Cloud, M.D.   Dg Chest Portable 1 View  11/25/2010  *RADIOLOGY REPORT*    IMPRESSION: No edema or focal airspace consolidation.  Original Report Authenticated By: ERIC A. MANSELL, M.D.    Medications:     . antiseptic oral rinse   Mouth Rinse BID  . aspirin EC  81 mg Oral Daily  . folic acid  1 mg Oral Daily  . heparin  5,000 Units Intravenous Once  . lidocaine  10 mL Intradermal Once  . metoprolol  100 mg Oral Daily  . moxifloxacin  400 mg Intravenous Q24H  . pneumococcal 23 valent vaccine  0.5 mL Intramuscular Tomorrow-1000  . sodium chloride  500 mL Intravenous Once  . vancomycin  750 mg Intravenous Q12H  . warfarin  5 mg Oral ONCE-1800    AssessmentPLan  Superior vena cava obstruction with collaterals: The patient is currently on a heparin drip,per  vascular surgery recommendations. Patient had her left subclavian catheter removed by surgery and was started on Coumadin and will need anticoagulation for at least 6 months.  Positive blood cultures:  The patient is currently on vancomycin and Avelox. The patient would need a transesophageal echo to rule out endocarditis. Because of her positive blood cultures. Cardiology consulted for possible TEE.  Sinus tachycardia :  Resolved   ANXIETY: Stable   HYPERTENSION:  Blood pressure is running low. Will implement parameters for her  antihypertensive medications.   COPD Stable  Hypokalemia: Repleted   Anemia: Stable     LOS: 2 days   Earlene Plater MD, [place] Pier 11/27/2010, 1:56  PM

## 2010-11-27 NOTE — Progress Notes (Addendum)
ANTIBIOTIC CONSULT NOTE   Pharmacy Consult for  Vancomycin Indication: rule out pneumonia  Allergies  Allergen Reactions  . Morphine   . Morphine And Related Itching   Patient Measurements: Height: 5' 7.5" (171.5 cm) Weight: 161 lb 6 oz (73.2 kg) IBW/kg (Calculated) : 62.75  Adjusted Body Weight: n/a  Vital Signs: Temp: 98.4 F (36.9 C) (10/24 0631) Temp src: Oral (10/24 0631) BP: 100/65 mmHg (10/24 0631) Pulse Rate: 97  (10/24 0631) Intake/Output from previous day: 10/23 0701 - 10/24 0700 In: 3357.4 [P.O.:1420; I.V.:983.4; IV Piggyback:954] Out: 1551 [Urine:1550; Stool:1] Intake/Output from this shift:    Labs: RECENT VANCOMYCIN TROUGH LEVELS: Recent Labs  Basename 11/27/10 0740   VANCOTROUGH 15.5    Basename 11/27/10 0452 11/26/10 0524 11/26/10 0110 11/25/10 1502  WBC 18.2* 20.1* -- 22.7*  HGB 9.1* 9.3* -- 10.7*  PLT 379 365 -- 400  LABCREA -- -- -- --  CREATININE -- 0.87 0.88 0.87   Estimated Creatinine Clearance: 63.1 ml/min (by C-G formula based on Cr of 0.87).  Microbiology: Recent Results (from the past 720 hour(s))  CULTURE, BLOOD (ROUTINE X 2)     Status: Normal (Preliminary result)   Collection Time   11/25/10  3:03 PM      Component Value Range Status Comment   Specimen Description RIGHT ANTECUBITAL   Final    Special Requests BOTTLES DRAWN AEROBIC AND ANAEROBIC 10CC   Final    Setup Time [id number]   Final    Culture     Final    Value: GRAM POSITIVE COCCI IN CLUSTERS     Note: Gram Stain Report Called to,Read Back By and Verified With: Sophronia Simas RN ON 11/26/10 AT 0725 BY RESSEGGER R  Performed at [facility]   Report Status PENDING   Incomplete   CULTURE, BLOOD (ROUTINE X 2)     Status: Normal (Preliminary result)   Collection Time   11/25/10  3:15 PM      Component Value Range Status Comment   Specimen Description BLOOD LEFT ANTECUBITAL   Final    Special Requests     Final    Value: BOTTLES DRAWN AEROBIC AND ANAEROBIC  AEB=12CC ANA=8CC   Setup Time [id number]   Final    Culture     Final    Value: GRAM POSITIVE COCCI IN CLUSTERS     Note: Gram Stain Report Called to,Read Back By and Verified With: Sophronia Simas RN ON [id number] AT 0725 BY RESSEGGER R Performed at [facility]   Report Status PENDING   Incomplete    Medical History: Past Medical History  Diagnosis Date  . Breast cancer    Medications:  Also on Moxifloxacin.  Assessment: Trough on target today.  Goal of Therapy:  Vancomycin trough level 15-20 mcg/ml  Plan: Continue current Rx Labs per protocol.   Margo Aye, Braydyn Schultes A 11/27/2010,8:38 AM

## 2010-11-28 ENCOUNTER — Encounter (HOSPITAL_COMMUNITY): Payer: Self-pay | Admitting: Adult Health

## 2010-11-28 DIAGNOSIS — I749 Embolism and thrombosis of unspecified artery: Secondary | ICD-10-CM

## 2010-11-28 DIAGNOSIS — A4101 Sepsis due to Methicillin susceptible Staphylococcus aureus: Secondary | ICD-10-CM

## 2010-11-28 LAB — BASIC METABOLIC PANEL
CO2: 23 mEq/L (ref 19–32)
Calcium: 9.4 mg/dL (ref 8.4–10.5)
GFR calc non Af Amer: 86 mL/min — ABNORMAL LOW (ref >90)
Sodium: 134 mEq/L — ABNORMAL LOW (ref 135–145)

## 2010-11-28 LAB — HEPARIN LEVEL (UNFRACTIONATED)
Heparin Unfractionated: 0.27 IU/mL — ABNORMAL LOW (ref 0.30–0.70)
Heparin Unfractionated: 0.47 IU/mL (ref 0.30–0.70)

## 2010-11-28 LAB — CULTURE, BLOOD (ROUTINE X 2): Culture  Setup Time: 201210231013

## 2010-11-28 LAB — CBC
MCH: 29.5 pg (ref 26.0–34.0)
Platelets: 398 10*3/uL (ref 150–400)
RBC: 3.15 MIL/uL — ABNORMAL LOW (ref 3.87–5.11)
WBC: 13.9 10*3/uL — ABNORMAL HIGH (ref 4.0–10.5)

## 2010-11-28 LAB — PROTIME-INR: Prothrombin Time: 17.3 seconds — ABNORMAL HIGH (ref 11.6–15.2)

## 2010-11-28 MED ORDER — NAFCILLIN SODIUM 2 G IJ SOLR
2.0000 g | INTRAMUSCULAR | Status: DC
  Administered 2010-11-28 – 2010-12-03 (×28): 2 g via INTRAVENOUS
  Filled 2010-11-28 (×30): qty 2000

## 2010-11-28 MED ORDER — WARFARIN SODIUM 7.5 MG PO TABS
7.5000 mg | ORAL_TABLET | Freq: Once | ORAL | Status: AC
  Administered 2010-11-28: 7.5 mg via ORAL
  Filled 2010-11-28: qty 1

## 2010-11-28 MED ORDER — NAFCILLIN SODIUM 2 G IJ SOLR
2.0000 g | INTRAMUSCULAR | Status: DC
  Filled 2010-11-28 (×6): qty 2000

## 2010-11-28 NOTE — Consult Note (Signed)
CARDIOLOGY CONSULT NOTE  Patient ID: [name] MRN: [id number] DOB/AGE: 1945-08-26 65 y.o.  Admit date: 11/25/2010 Referring Physician: PTH Primary Physician: Mirna Mires Primary Cardiologist: Dietrich Pates (new) [Formerly [facility] not seen in 10 years.] Reason for Consultation: TEE in the setting of positive blood cultures; r/o endocarditis.   HPI: [name] is a very pleasant 65 y/o patient of Dr. Loleta Chance with known history of breast CA treated with chemo using left subclavian porta cath (placed in 2006).; COPD, hypertension.  She was admitted to ER after having flu-like symptoms and chest pain on the left upper chest.  CT scan found organized thrombus around the subclavian catheter with almost complete obstruction of the superior vena cava.  She had this catheter removed by Dr. Leticia Penna on 11/26/10. She was placed on heparin and is transitioning to coumadin.  Unfortunately she was found to have positive blood cultures with staph aureus. She is started on vancomycin. We are asked to perform TEE to rule out bacterial endocarditis and vegetation. She has been seen in the past by cardiologist, " a lady doctor" with [facility] about 10 years ago for tachycardia. She states they gave her medications to assist and did tests but they were negative.  She was lost to follow-up.  Currently she is without complaint and is feeling markedly better.  Review of systems complete and found to be negative unless listed above   Past Medical History  Diagnosis Date  . Breast cancer     No family history on file.  History   Social History  . Marital Status: Legally Separated    Spouse Name: N/A    Number of Children: N/A  . Years of Education: N/A   Occupational History  . Nurses Aid     [employer]   Social History Main Topics  . Smoking status: Former Smoker    Quit date: 11/21/2010  . Smokeless tobacco: Not on file  . Alcohol Use: No  . Drug Use: No  . Sexually Active: Not on file   Other Topics Concern    . Not on file   Social History Narrative   Lives in [place]    Past Surgical History  Procedure Date  . Mastectomy   . 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 aspirin EC 81 MG tablet Take 81 mg by mouth daily.        . metoprolol (TOPROL-XL) 100 MG 24 hr tablet Take 100 mg by mouth daily.          Physical Exam: Blood pressure 103/66, pulse 86, temperature 97.8 F (36.6 C), temperature source Oral, resp. rate 20, height 5' 7.5" (1.715 m), weight 76.25 kg (168 lb 1.6 oz), SpO2 95.00%.   General: Well developed, well nourished, in no acute distress Head: Eyes PERRLA, No xanthomas.   Normal cephalic and atramatic  Lungs: Clear bilaterally to auscultation and percussion. Heart: HRRR S1 S2, 1/6 systolic murmur and soft diastolic murmur.Pulses are 2+ & equal.            No carotid bruit. No JVD.  No abdominal bruits. No femoral bruits. Abdomen: Bowel sounds are positive, abdomen soft and non-tender without masses or                  Hernia's noted. Msk:  Back normal, normal gait. Normal strength and tone for age. Dressing to the left chest. Extremities: No clubbing, cyanosis or edema.  DP +  1 Neuro: Alert and oriented X 3. Psych:  Good affect, responds appropriately  Labs:   Lab Results  Component Value Date   WBC 13.9* 11/28/2010   HGB 9.3* 11/28/2010   HCT 28.9* 11/28/2010   MCV 91.7 11/28/2010   PLT 398 11/28/2010     Lab 11/28/10 0527 11/26/10 0110  NA 134* --  K 3.9 --  CL 100 --  CO2 23 --  BUN 4* --  CREATININE 0.77 --  CALCIUM 9.4 --  PROT -- 8.5*  BILITOT -- 0.6  ALKPHOS -- 100  ALT -- 26  AST -- 25  GLUCOSE 97 --       Radiology: CT Scan 1. No embolus is observed.  2. Fibrin sheath and probably thrombus in the distal SVC around  the left subclavian catheter causes near occlusion of the SVC, with  mediastinal collateral vessels noted with prominent azygos and  hemiazygous contrast likely feeding  into the inferior vena cava.  There is abnormal stranding around the left axillary, brachial  cephalic, and subclavian veins, possibly from thrombosis and  inflammation. Tumor recurrence is not excluded but is considered  less likely given the constellation of findings. I recommend left  upper extremity venous ultrasound for further workup.  3. Aberrant right subclavian artery.  4. Emphysema.  5. Atelectasis in the lung bases.  6. Trace pericardial effusion.  Chest X-Ray 11/25/2010 IMPRESSION:  No edema or focal airspace consolidation.  ECHO: 11/26/2010 - Left ventricle: The cavity size was normal. Wall thickness was normal. Systolic function was low normal. The estimated ejection fraction was in the range of 50% to 55%. Wall motion was normal; there were no regional wall motion abnormalities. No evidence of thrombus. Increase density near the apex; although this could represent prominent trabeculations and myocardial bands, cannot exclude apical mass or thrombus. - Atrial septum: No defect or patent foramen ovale was    EKG Sinus tachycardia rate of 131 Possible Left atrial enlargement Septal infarct , age undetermined Abnormal ECG No previous ECGs available  ASSESSMENT AND PLAN:   1. Acute left subclavian thrombus: Now on heparin and coumadin per pharmacy. She is tolerating this well without active bleeding. Hemoccult stools and CBC should be monitored.  2: Abnormal Echocardiogram: Densitie near the apex. . .cannot exclude apical mass or thrombus.  She also has positive blood cultures for staph aureus. TEE is warranted and is scheduled in the am for 9:30 am with Dr. Dietrich Pates. This procedure has been explained with risks and benefits. She is willing to proceed with this. Will keep her NPO after midnight.  3. Bacteremia: Blood cultures positive for staph aureus. Continues on vancomycin IV.     Signed: Bettey Mare. Lyman Bishop NP [facility] 11/28/2010, 4:33  PM  Cardiology Attending  Patient interviewed and examined. Discussed with Joni Reining, NP.  Above note annotated and modified based upon my findings.  Current problems likely explained by thrombus and pannus on central line inserted 6 years ago with infection of the thrombus.  Possible apical mass on echo would be a most unusual presentation of endocarditis.  Exam does not suggest valvular lesions.  Unfortunately, TEE may not clarify findings at the LV apex, but is reasonable to R/O endocarditis.  Telephone consult with [facility] ID might be of benefit.  My opinion is that she needs 4 weeks of antibiotics and subsequent surveillance for a presumed endovascular infection.  [place], MD

## 2010-11-28 NOTE — Progress Notes (Signed)
Subjective:  Patient denies any chest pain or shortness of breath. She is without any significant complaints.  Objective: Vital signs in last 24 hours: Filed Vitals:   11/28/10 0150 11/28/10 0306 11/28/10 0603 11/28/10 1034  BP: 102/59  102/65 92/56  Pulse: 84  92 85  Temp: 97.8 F (36.6 C)  98.2 F (36.8 C) 97.6 F (36.4 C)  TempSrc: Oral  Oral Oral  Resp: 20  20 20   Height:      Weight:  76.25 kg (168 lb 1.6 oz)    SpO2: 91%  90% 95%    Intake/Output Summary (Last 24 hours) at 11/28/10 1447 Last data filed at 11/28/10 1300  Gross per 24 hour  Intake 2970.45 ml  Output   3100 ml  Net -129.55 ml    Weight change: 3.05 kg (6 lb 11.6 oz) General: Alert, awake, oriented x3, in no acute distress. HEENT: No bruits, no goiter. Heart: Regular rate and rhythm, without murmurs, rubs, gallops. Lungs: Clear to auscultation bilaterally. Abdomen: Soft, nontender, nondistended, positive bowel sounds. Extremities: No clubbing cyanosis or edema with positive pedal pulses. Neuro: Grossly intact, nonfocal.    Lab Results: Results for orders placed during the hospital encounter of 11/25/10 (from the past 24 hour(s))  HEPARIN LEVEL     Status: Abnormal   Collection Time   11/28/10  5:27 AM      Component Value Range   Heparin Unfractionated 0.27 (*) 0.30 - 0.70 (IU/mL)  PROTIME-INR     Status: Abnormal   Collection Time   11/28/10  5:27 AM      Component Value Range   Prothrombin Time 17.3 (*) 11.6 - 15.2 (seconds)   INR 1.39  0.00 - 1.49   CBC     Status: Abnormal   Collection Time   11/28/10  5:27 AM      Component Value Range   WBC 13.9 (*) 4.0 - 10.5 (K/uL)   RBC 3.15 (*) 3.87 - 5.11 (MIL/uL)   Hemoglobin 9.3 (*) 12.0 - 15.0 (g/dL)   HCT 40.9 (*) 81.1 - 46.0 (%)   MCV 91.7  78.0 - 100.0 (fL)   MCH 29.5  26.0 - 34.0 (pg)   MCHC 32.2  30.0 - 36.0 (g/dL)   RDW 91.4  78.2 - 95.6 (%)   Platelets 398  150 - 400 (K/uL)  BASIC METABOLIC PANEL     Status: Abnormal   Collection Time   11/28/10  5:27 AM      Component Value Range   Sodium 134 (*) 135 - 145 (mEq/L)   Potassium 3.9  3.5 - 5.1 (mEq/L)   Chloride 100  96 - 112 (mEq/L)   CO2 23  19 - 32 (mEq/L)   Glucose, Bld 97  70 - 99 (mg/dL)   BUN 4 (*) 6 - 23 (mg/dL)   Creatinine, Ser 2.13  0.50 - 1.10 (mg/dL)   Calcium 9.4  8.4 - 08.6 (mg/dL)   GFR calc non Af Amer 86 (*) >90 (mL/min)   GFR calc Af Amer >90  >90 (mL/min)     Micro: Recent Results (from the past 240 hour(s))  CULTURE, BLOOD (ROUTINE X 2)     Status: Normal   Collection Time   11/25/10  3:03 PM      Component Value Range Status Comment   Specimen Description RIGHT ANTECUBITAL   Final    Special Requests BOTTLES DRAWN AEROBIC AND ANAEROBIC 10CC   Final    Setup Time [id number]  Final    Culture     Final    Value: STAPHYLOCOCCUS AUREUS     Note: SUSCEPTIBILITIES PERFORMED ON PREVIOUS CULTURE WITHIN THE LAST 5 DAYS.     Note: Gram Stain Report Called to,Read Back By and Verified With: SURLES Tamera Punt RN ON 11/26/10 AT 0725 BY RESSEGGER R  Performed at [facility]   Report Status 11/28/2010 FINAL   Final   CULTURE, BLOOD (ROUTINE X 2)     Status: Normal   Collection Time   11/25/10  3:15 PM      Component Value Range Status Comment   Specimen Description BLOOD LEFT ANTECUBITAL   Final    Special Requests     Final    Value: BOTTLES DRAWN AEROBIC AND ANAEROBIC AEB=12CC ANA=8CC   Setup Time [id number]   Final    Culture     Final    Value: STAPHYLOCOCCUS AUREUS     Note: RIFAMPIN AND GENTAMICIN SHOULD NOT BE USED AS SINGLE DRUGS FOR TREATMENT OF STAPH INFECTIONS. This organism is presumed to be Clindamycin resistant based on detection of inducible Clindamycin resistance.     Note: Gram Stain Report Called to,Read Back By and Verified With: Sophronia Simas RN ON [phone] AT 0725 BY RESSEGGER R Performed at [facility]   Report Status 11/28/2010 FINAL   Final    Organism ID, Bacteria STAPHYLOCOCCUS AUREUS    Final   URINE CULTURE     Status: Normal (Preliminary result)   Collection Time   11/26/10 11:05 AM      Component Value Range Status Comment   Specimen Description URINE, CLEAN CATCH   Final    Special Requests NONE   Final    Setup Time [id number]   Final    Colony Count PENDING   Incomplete    Culture Culture reincubated for better growth   Final    Report Status PENDING   Incomplete     Studies/Results: Ct Angio Chest W/cm &/or Wo Cm  11/25/2010  *RADIOLOGY REPORT*   IMPRESSION:  1.  No embolus is observed. 2.  Fibrin sheath and probably thrombus in the distal SVC around the left subclavian catheter causes near occlusion of the SVC, with mediastinal collateral vessels noted with prominent azygos and hemiazygous contrast likely feeding into the inferior vena cava. There is abnormal stranding around the left axillary, brachial cephalic, and subclavian veins, possibly from thrombosis and inflammation.  Tumor recurrence is not excluded but is considered less likely given the constellation of findings.  I recommend left upper extremity venous ultrasound for further workup. 3.  Aberrant right subclavian artery. 4.  Emphysema. 5.  Atelectasis in the lung bases. 6.  Trace pericardial effusion.  Original Report Authenticated By: Dellia Cloud, M.D.   Dg Chest Portable 1 View  11/25/2010  *RADIOLOGY REPORT*    IMPRESSION: No edema or focal airspace consolidation.  Original Report Authenticated By: ERIC A. MANSELL, M.D.    Medications:     . antiseptic oral rinse   Mouth Rinse BID  . aspirin EC  81 mg Oral Daily  . folic acid  1 mg Oral Daily  . metoprolol  100 mg Oral Daily  . nafcillin  2 g Intravenous Q4H  . sodium chloride  500 mL Intravenous Once  . warfarin  5 mg Oral ONCE-1800  . warfarin  7.5 mg Oral ONCE-1800  . DISCONTD: moxifloxacin  400 mg Intravenous Q24H  . DISCONTD: vancomycin  750 mg Intravenous Q12H  AssessmentPLan  Superior vena cava obstruction with  collaterals: The patient is currently on a heparin drip,per  vascular surgery recommendations. Patient had her left subclavian catheter removed by surgery and was started on Coumadin and will need anticoagulation for at least 6 months.  Positive blood cultures:  The patient is was on vancomycin and Avelox. The patient would need a transesophageal echo to rule out endocarditis because of her positive blood cultures. Cardiology consulted for possible TEE. Staph is pansensitive so will change her antibiotic to nafcillin and and discontinued vancomycin and Avelox per up-to-date recommendations. Discussed with Pharmacologist cardiology.  Sinus tachycardia :  Resolved   ANXIETY: Stable   HYPERTENSION:  Blood pressure is running low.  parameters implemented for her antihypertensive medications.   COPD Stable  Hypokalemia: Repleted   Anemia: Stable     LOS: 3 days   Earlene Plater MD, [place]ell Pier 11/28/2010, 2:47 PM

## 2010-11-28 NOTE — Progress Notes (Signed)
ANTICOAGULATION CONSULT NOTE   Pharmacy Consult for Heparin and Warfarin Indication: Subclavian vein thrombus  Allergies  Allergen Reactions  . Morphine   . Morphine And Related Itching   Patient Measurements: Height: 5' 7.5" (171.5 cm) Weight: 168 lb 1.6 oz (76.25 kg) IBW/kg (Calculated) : 62.75  Adjusted Body Weight:   Vital Signs: Temp: 98.2 F (36.8 C) (10/25 0603) Temp src: Oral (10/25 0603) BP: 102/65 mmHg (10/25 0603) Pulse Rate: 92  (10/25 0603)  Labs:  [name] 11/28/10 0527 11/27/10 0754 11/27/10 0452 11/26/10 2307 11/26/10 0524 11/26/10 0110 11/25/10 1502  HGB 9.3* -- 9.1* -- -- -- --  HCT 28.9* -- 27.3* -- 27.4* -- --  PLT 398 -- 379 -- 365 -- --  APTT -- -- -- -- -- -- 41*  LABPROT 17.3* 16.8* -- -- -- -- 17.3*  INR 1.39 1.34 -- -- -- -- 1.39  HEPARINUNFRC 0.27* -- 0.47 0.60 -- -- --  CREATININE 0.77 -- -- -- 0.87 0.88 --  CKTOTAL -- -- -- -- -- -- --  CKMB -- -- -- -- -- -- --  TROPONINI -- -- -- -- -- -- --   Estimated Creatinine Clearance: 74.5 ml/min (by C-G formula based on Cr of 0.77).  Medical History: Past Medical History  Diagnosis Date  . Breast cancer    Medications:  Scheduled:     . antiseptic oral rinse   Mouth Rinse BID  . aspirin EC  81 mg Oral Daily  . folic acid  1 mg Oral Daily  . metoprolol  100 mg Oral Daily  . moxifloxacin  400 mg Intravenous Q24H  . pneumococcal 23 valent vaccine  0.5 mL Intramuscular Tomorrow-1000  . sodium chloride  500 mL Intravenous Once  . vancomycin  750 mg Intravenous Q12H  . warfarin  5 mg Oral ONCE-1800  . warfarin  7.5 mg Oral ONCE-1800  . DISCONTD: metoprolol  100 mg Oral Daily   Infustions:     . sodium chloride 75 mL/hr at 11/27/10 1423  . heparin 1,600 Units/hr (11/28/10 0207)   Assessment: Heparin level subtherapeutic today. titrating Warfarin for INR 2-3  Goal of Therapy:  Heparin level 0.3-0.7 units/ml INR 2-3   Plan: Increase Heparin to 1700 units/hr Re-check level in 6  hours Coumadin 7.5mg  today Heparin level daily INR daily Monitor platelets per protocol  [name] A 11/28/2010,8:49 AM

## 2010-11-29 ENCOUNTER — Inpatient Hospital Stay (HOSPITAL_COMMUNITY): Payer: PRIVATE HEALTH INSURANCE

## 2010-11-29 ENCOUNTER — Encounter (HOSPITAL_COMMUNITY)
Admission: EM | Disposition: A | Discharge status: Home Health | Payer: Self-pay | Source: Home / Self Care | Attending: Internal Medicine

## 2010-11-29 ENCOUNTER — Encounter (HOSPITAL_COMMUNITY): Payer: PRIVATE HEALTH INSURANCE

## 2010-11-29 ENCOUNTER — Encounter (HOSPITAL_COMMUNITY): Payer: Self-pay | Admitting: *Deleted

## 2010-11-29 DIAGNOSIS — I339 Acute and subacute endocarditis, unspecified: Secondary | ICD-10-CM

## 2010-11-29 HISTORY — PX: TEE WITHOUT CARDIOVERSION: SHX5443

## 2010-11-29 LAB — BASIC METABOLIC PANEL
BUN: 4 mg/dL — ABNORMAL LOW (ref 6–23)
GFR calc Af Amer: 80 mL/min — ABNORMAL LOW (ref >90)
GFR calc non Af Amer: 69 mL/min — ABNORMAL LOW (ref >90)
Potassium: 3.9 mEq/L (ref 3.5–5.1)

## 2010-11-29 LAB — OCCULT BLOOD X 1 CARD TO LAB, STOOL: Fecal Occult Bld: NEGATIVE

## 2010-11-29 LAB — CBC
HCT: 27.5 % — ABNORMAL LOW (ref 36.0–46.0)
MCHC: 32.7 g/dL (ref 30.0–36.0)
Platelets: 449 10*3/uL — ABNORMAL HIGH (ref 150–400)
RDW: 15.3 % (ref 11.5–15.5)

## 2010-11-29 LAB — HEPARIN LEVEL (UNFRACTIONATED): Heparin Unfractionated: 0.38 IU/mL (ref 0.30–0.70)

## 2010-11-29 LAB — PROTIME-INR
INR: 1.79 — ABNORMAL HIGH (ref 0.00–1.49)
Prothrombin Time: 21.1 seconds — ABNORMAL HIGH (ref 11.6–15.2)

## 2010-11-29 SURGERY — ECHOCARDIOGRAM, TRANSESOPHAGEAL
Anesthesia: LOCAL

## 2010-11-29 MED ORDER — WARFARIN SODIUM 5 MG PO TABS
5.0000 mg | ORAL_TABLET | Freq: Once | ORAL | Status: AC
  Administered 2010-11-29: 5 mg via ORAL
  Filled 2010-11-29: qty 1

## 2010-11-29 MED ORDER — FENTANYL CITRATE 0.05 MG/ML IJ SOLN
INTRAMUSCULAR | Status: AC
  Filled 2010-11-29: qty 2

## 2010-11-29 MED ORDER — MIDAZOLAM HCL 5 MG/5ML IJ SOLN
INTRAMUSCULAR | Status: DC | PRN
  Administered 2010-11-29 (×4): 2 mg via INTRAVENOUS

## 2010-11-29 MED ORDER — SODIUM CHLORIDE 0.9 % IJ SOLN
10.0000 mL | Freq: Two times a day (BID) | INTRAMUSCULAR | Status: DC
  Administered 2010-11-29 – 2010-12-02 (×5): 10 mL
  Filled 2010-11-29 (×4): qty 10

## 2010-11-29 MED ORDER — SODIUM CHLORIDE 0.9 % IJ SOLN
10.0000 mL | INTRAMUSCULAR | Status: DC | PRN
  Filled 2010-11-29: qty 10

## 2010-11-29 MED ORDER — SODIUM CHLORIDE 0.45 % IV SOLN
INTRAVENOUS | Status: DC
  Administered 2010-11-29: 10:00:00 via INTRAVENOUS

## 2010-11-29 MED ORDER — MIDAZOLAM HCL 5 MG/5ML IJ SOLN
INTRAMUSCULAR | Status: AC
  Administered 2010-11-29: 10:00:00
  Filled 2010-11-29: qty 10

## 2010-11-29 NOTE — Progress Notes (Signed)
ANTICOAGULATION CONSULT NOTE   Pharmacy Consult for Heparin and Warfarin Indication: Subclavian vein thrombus  Allergies  Allergen Reactions  . Morphine   . Morphine And Related Itching   Patient Measurements: Height: 5' 7.5" (171.5 cm) Weight: 162 lb 11.2 oz (73.8 kg) IBW/kg (Calculated) : 62.75  Adjusted Body Weight:   Vital Signs: Temp: 98 F (36.7 C) (10/26 0901) Temp src: Oral (10/26 0440) BP: 124/78 mmHg (10/26 1010) Pulse Rate: 94  (10/26 1010)  Labs:  Basename 11/29/10 0510 11/28/10 1506 11/28/10 0527 11/27/10 0754 11/27/10 0452  HGB 9.0* -- 9.3* -- --  HCT 27.5* -- 28.9* -- 27.3*  PLT 449* -- 398 -- 379  APTT -- -- -- -- --  LABPROT 21.1* -- 17.3* 16.8* --  INR 1.79* -- 1.39 1.34 --  HEPARINUNFRC 0.38 0.47 0.27* -- --  CREATININE 0.86 -- 0.77 -- --  CKTOTAL -- -- -- -- --  CKMB -- -- -- -- --  TROPONINI -- -- -- -- --   Estimated Creatinine Clearance: 63.8 ml/min (by C-G formula based on Cr of 0.86).  Medical History: Past Medical History  Diagnosis Date  . Breast cancer    Medications:  Scheduled:     . antiseptic oral rinse   Mouth Rinse BID  . aspirin EC  81 mg Oral Daily  . fentaNYL      . folic acid  1 mg Oral Daily  . metoprolol  100 mg Oral Daily  . midazolam      . nafcillin IV  2 g Intravenous Q4H  . sodium chloride  500 mL Intravenous Once  . warfarin  5 mg Oral ONCE-1800  . warfarin  7.5 mg Oral ONCE-1800  . DISCONTD: moxifloxacin  400 mg Intravenous Q24H  . DISCONTD: nafcillin  2 g Intravenous Q4H  . DISCONTD: vancomycin  750 mg Intravenous Q12H   Infustions:     . sodium chloride 20 mL/hr at 11/29/10 0937  . sodium chloride 75 mL/hr at 11/27/10 1423  . heparin 1,700 Units/hr (11/28/10 2054)   Assessment: Heparin level therapeutic titrating Warfarin for INR 2-3  Goal of Therapy:  Heparin level 0.3-0.7 units/ml INR 2-3   Plan: Continue Heparin at current rate Coumadin 5mg  today Heparin level daily INR daily Monitor  platelets per protocol  [name] A 11/29/2010,10:24 AM

## 2010-11-29 NOTE — Progress Notes (Signed)
*  PRELIMINARY RESULTS* Echocardiogram Transesophageal echo  has been performed.  Conrad La Bolt 11/29/2010, 11:07 AM

## 2010-11-29 NOTE — Progress Notes (Signed)
MD notified of unsuccessful PICC placement at this time.

## 2010-11-29 NOTE — Progress Notes (Signed)
ANTIBIOTIC CONSULT NOTE   Pharmacy Consult for  Vancomycin Indication: rule out pneumonia  Allergies  Allergen Reactions  . Morphine   . Morphine And Related Itching   Patient Measurements: Height: 5' 7.5" (171.5 cm) Weight: 162 lb 11.2 oz (73.8 kg) IBW/kg (Calculated) : 62.75  Adjusted Body Weight: n/a  Vital Signs: Temp: 98 F (36.7 C) (10/26 0901) Temp src: Oral (10/26 0440) BP: 124/78 mmHg (10/26 1010) Pulse Rate: 94  (10/26 1010) Intake/Output from previous day: 10/25 0701 - 10/26 0700 In: 1361 [P.O.:400; I.V.:811; IV Piggyback:150] Out: 2150 [Urine:2150] Intake/Output from this shift:    Labs: RECENT VANCOMYCIN TROUGH LEVELS: Recent Labs  Basename 11/27/10 0740   VANCOTROUGH 15.5    Basename 11/29/10 0510 11/28/10 0527 11/27/10 0452  WBC 12.6* 13.9* 18.2*  HGB 9.0* 9.3* 9.1*  PLT 449* 398 379  LABCREA -- -- --  CREATININE 0.86 0.77 --   Estimated Creatinine Clearance: 63.8 ml/min (by C-G formula based on Cr of 0.86).  Microbiology: Recent Results (from the past 720 hour(s))  CULTURE, BLOOD (ROUTINE X 2)     Status: Normal   Collection Time   11/25/10  3:03 PM      Component Value Range Status Comment   Specimen Description RIGHT ANTECUBITAL   Final    Special Requests BOTTLES DRAWN AEROBIC AND ANAEROBIC 10CC   Final    Setup Time [id number]   Final    Culture     Final    Value: STAPHYLOCOCCUS AUREUS     Note: SUSCEPTIBILITIES PERFORMED ON PREVIOUS CULTURE WITHIN THE LAST 5 DAYS.     Note: Gram Stain Report Called to,Read Back By and Verified With: SURLES Tamera Punt RN ON 11/26/10 AT 0725 BY RESSEGGER R  Performed at [facility]   Report Status 11/28/2010 FINAL   Final   CULTURE, BLOOD (ROUTINE X 2)     Status: Normal   Collection Time   11/25/10  3:15 PM      Component Value Range Status Comment   Specimen Description BLOOD LEFT ANTECUBITAL   Final    Special Requests     Final    Value: BOTTLES DRAWN AEROBIC AND ANAEROBIC AEB=12CC  ANA=8CC   Setup Time [id number]   Final    Culture     Final    Value: STAPHYLOCOCCUS AUREUS     Note: RIFAMPIN AND GENTAMICIN SHOULD NOT BE USED AS SINGLE DRUGS FOR TREATMENT OF STAPH INFECTIONS. This organism is presumed to be Clindamycin resistant based on detection of inducible Clindamycin resistance.     Note: Gram Stain Report Called to,Read Back By and Verified With: Sophronia Simas RN ON [phone] AT 0725 BY RESSEGGER R Performed at [facility]   Report Status 11/28/2010 FINAL   Final    Organism ID, Bacteria STAPHYLOCOCCUS AUREUS   Final   URINE CULTURE     Status: Normal   Collection Time   11/26/10 11:05 AM      Component Value Range Status Comment   Specimen Description URINE, CLEAN CATCH   Final    Special Requests NONE   Final    Setup Time [id number]   Final    Colony Count 60,000 COLONIES/ML   Final    Culture     Final    Value: Multiple bacterial morphotypes present, none predominant. Suggest appropriate recollection if clinically indicated.   Report Status 11/28/2010 FINAL   Final    Medical History: Past Medical History  Diagnosis Date  .  Breast cancer    Medications:  Also on Moxifloxacin.  Assessment: Renal fxn stable  Goal of Therapy:  Vancomycin trough level 15-20 mcg/ml  Plan: Continue current Rx Labs per protocol.   Margo Aye, Christine Morton A 11/29/2010,10:24 AM

## 2010-11-29 NOTE — OR Nursing (Addendum)
picc exchange due to continued malposition/exchanged under sterile central line bundle 42cm. Pt tolerated well brisk bld. Return and pcxr done.

## 2010-11-29 NOTE — OR Nursing (Signed)
PICC exchange not successful would not drop into SVC, D/C intact sterile pressure drsg. applied, nurse informed, will call MD for referral to interventional radiology in [place]

## 2010-11-29 NOTE — Progress Notes (Signed)
.sk Subjective:  Patient denies any chest pain or shortness of breath. Patient had her TEE today. She is back and is doing well.    Objective: Vital signs in last 24 hours: Filed Vitals:   11/29/10 1025 11/29/10 1030 11/29/10 1035 11/29/10 1040  BP: 110/74 114/73 118/71   Pulse: 91 90 92 91  Temp:      TempSrc:      Resp: 23 23 21 21   Height:      Weight:      SpO2: 96% 94% 93% 96%    Intake/Output Summary (Last 24 hours) at 11/29/10 1125 Last data filed at 11/29/10 0500  Gross per 24 hour  Intake   1361 ml  Output   2150 ml  Net   -789 ml    Weight change: -2.45 kg (-5 lb 6.4 oz) General: Alert, awake, oriented x3, in no acute distress. HEENT: No bruits, no goiter. Heart: Regular rate and rhythm, without murmurs, rubs, gallops. Lungs: Clear to auscultation bilaterally. Abdomen: Soft, nontender, nondistended, positive bowel sounds. Extremities: No clubbing cyanosis or edema with positive pedal pulses. Neuro: Grossly intact, nonfocal.    Lab Results: Results for orders placed during the hospital encounter of 11/25/10 (from the past 24 hour(s))  HEPARIN LEVEL     Status: Normal   Collection Time   11/28/10  3:06 PM      Component Value Range   Heparin Unfractionated 0.47  0.30 - 0.70 (IU/mL)  HEPARIN LEVEL     Status: Normal   Collection Time   11/29/10  5:10 AM      Component Value Range   Heparin Unfractionated 0.38  0.30 - 0.70 (IU/mL)  PROTIME-INR     Status: Abnormal   Collection Time   11/29/10  5:10 AM      Component Value Range   Prothrombin Time 21.1 (*) 11.6 - 15.2 (seconds)   INR 1.79 (*) 0.00 - 1.49   CBC     Status: Abnormal   Collection Time   11/29/10  5:10 AM      Component Value Range   WBC 12.6 (*) 4.0 - 10.5 (K/uL)   RBC 3.01 (*) 3.87 - 5.11 (MIL/uL)   Hemoglobin 9.0 (*) 12.0 - 15.0 (g/dL)   HCT 95.6 (*) 21.3 - 46.0 (%)   MCV 91.4  78.0 - 100.0 (fL)   MCH 29.9  26.0 - 34.0 (pg)   MCHC 32.7  30.0 - 36.0 (g/dL)   RDW 08.6  57.8 - 46.9  (%)   Platelets 449 (*) 150 - 400 (K/uL)  BASIC METABOLIC PANEL     Status: Abnormal   Collection Time   11/29/10  5:10 AM      Component Value Range   Sodium 138  135 - 145 (mEq/L)   Potassium 3.9  3.5 - 5.1 (mEq/L)   Chloride 104  96 - 112 (mEq/L)   CO2 24  19 - 32 (mEq/L)   Glucose, Bld 107 (*) 70 - 99 (mg/dL)   BUN 4 (*) 6 - 23 (mg/dL)   Creatinine, Ser 6.29  0.50 - 1.10 (mg/dL)   Calcium 9.7  8.4 - 52.8 (mg/dL)   GFR calc non Af Amer 69 (*) >90 (mL/min)   GFR calc Af Amer 80 (*) >90 (mL/min)     Micro: Recent Results (from the past 240 hour(s))  CULTURE, BLOOD (ROUTINE X 2)     Status: Normal   Collection Time   11/25/10  3:03 PM  Component Value Range Status Comment   Specimen Description RIGHT ANTECUBITAL   Final    Special Requests BOTTLES DRAWN AEROBIC AND ANAEROBIC 10CC   Final    Setup Time [id number]   Final    Culture     Final    Value: STAPHYLOCOCCUS AUREUS     Note: SUSCEPTIBILITIES PERFORMED ON PREVIOUS CULTURE WITHIN THE LAST 5 DAYS.     Note: Gram Stain Report Called to,Read Back By and Verified With: SURLES Tamera Punt RN ON 11/26/10 AT 0725 BY RESSEGGER R  Performed at [facility]   Report Status 11/28/2010 FINAL   Final   CULTURE, BLOOD (ROUTINE X 2)     Status: Normal   Collection Time   11/25/10  3:15 PM      Component Value Range Status Comment   Specimen Description BLOOD LEFT ANTECUBITAL   Final    Special Requests     Final    Value: BOTTLES DRAWN AEROBIC AND ANAEROBIC AEB=12CC ANA=8CC   Setup Time [id number]   Final    Culture     Final    Value: STAPHYLOCOCCUS AUREUS     Note: RIFAMPIN AND GENTAMICIN SHOULD NOT BE USED AS SINGLE DRUGS FOR TREATMENT OF STAPH INFECTIONS. This organism is presumed to be Clindamycin resistant based on detection of inducible Clindamycin resistance.     Note: Gram Stain Report Called to,Read Back By and Verified With: Sophronia Simas RN ON [phone] AT 0725 BY RESSEGGER R Performed at [facility]    Report Status 11/28/2010 FINAL   Final    Organism ID, Bacteria STAPHYLOCOCCUS AUREUS   Final   URINE CULTURE     Status: Normal   Collection Time   11/26/10 11:05 AM      Component Value Range Status Comment   Specimen Description URINE, CLEAN CATCH   Final    Special Requests NONE   Final    Setup Time [id number]   Final    Colony Count 60,000 COLONIES/ML   Final    Culture     Final    Value: Multiple bacterial morphotypes present, none predominant. Suggest appropriate recollection if clinically indicated.   Report Status 11/28/2010 FINAL   Final     Studies/Results: Ct Angio Chest W/cm &/or Wo Cm  11/25/2010  *RADIOLOGY REPORT*   IMPRESSION:  1.  No embolus is observed. 2.  Fibrin sheath and probably thrombus in the distal SVC around the left subclavian catheter causes near occlusion of the SVC, with mediastinal collateral vessels noted with prominent azygos and hemiazygous contrast likely feeding into the inferior vena cava. There is abnormal stranding around the left axillary, brachial cephalic, and subclavian veins, possibly from thrombosis and inflammation.  Tumor recurrence is not excluded but is considered less likely given the constellation of findings.  I recommend left upper extremity venous ultrasound for further workup. 3.  Aberrant right subclavian artery. 4.  Emphysema. 5.  Atelectasis in the lung bases. 6.  Trace pericardial effusion.  Original Report Authenticated By: Dellia Cloud, M.D.   Dg Chest Portable 1 View  11/25/2010  *RADIOLOGY REPORT*    IMPRESSION: No edema or focal airspace consolidation.  Original Report Authenticated By: ERIC A. MANSELL, M.D.    Medications:     . antiseptic oral rinse   Mouth Rinse BID  . aspirin EC  81 mg Oral Daily  . folic acid  1 mg Oral Daily  . metoprolol  100 mg Oral Daily  . midazolam      .  nafcillin IV  2 g Intravenous Q4H  . sodium chloride  500 mL Intravenous Once  . warfarin  5 mg Oral ONCE-1800  . warfarin   7.5 mg Oral ONCE-1800  . DISCONTD: moxifloxacin  400 mg Intravenous Q24H  . DISCONTD: nafcillin  2 g Intravenous Q4H  . DISCONTD: vancomycin  750 mg Intravenous Q12H    AssessmentPLan  Superior vena cava obstruction with collaterals: Will continue heparin and Coumadin. Goal INR is between 2 to 3. Patient should be on Coumadin for at least 6 months.  Hickman catheter has been removed per general surgery.   Positive blood cultures:  The patient is was on vancomycin and Avelox. Based on up-to-date recommendation. Her blood cultures came back pansensitive so I switched her from vancomycin and Avelox to nafcillin per up-to-date recommendation. At the time of discharge patient can be switched to cefazolin because it's dosed every 12 as opposed to nafcillin every 4 discussed. I discussed the case with Dr. Luciana Axe infectious disease at [facility]. He recommends IV antibiotics for a total of 6 weeks. And this is whether patient has endocarditis or not.  Patient can be discharged when her INR is therapeutic. I suspect it should be therapeutic within the next one to 2 days.  Sinus tachycardia :  Resolved   ANXIETY: Stable   HYPERTENSION:  Blood pressure is running low.  parameters implemented for her antihypertensive medications.  COPD Stable  Hypokalemia: Repleted   Anemia: Stable  Disposition: When INR between 2 and 3.     LOS: 4 days   Amanda Plater MD, [place] Pier 11/29/2010, 11:25 AM

## 2010-11-29 NOTE — Progress Notes (Signed)
Subjective: Patient has done well overnight with no chest discomfort, dyspnea or palpitations. She denies fever or rigors. She is mildly anxious with respect to her upcoming TEE, but was reassured that she will be comfortable and that this is a low risk procedure.  Objective: Vital signs in last 24 hours: Temp:  [97.3 F (36.3 C)-98.2 F (36.8 C)] 97.3 F (36.3 C) (10/26 1415) Pulse Rate:  [67-97] 89  (10/26 1415) Resp:  [15-24] 20  (10/26 1415) BP: (88-135)/(55-104) 108/69 mmHg (10/26 1415) SpO2:  [89 %-98 %] 89 % (10/26 1415) Weight:  [73.8 kg (162 lb 11.2 oz)] 162 lb 11.2 oz (73.8 kg) (10/26 0502) Weight change: -2.45 kg (-5 lb 6.4 oz) Last BM Date: 11/27/10  Intake/Output from previous day: 10/25 0701 - 10/26 0700 In: 1361 [P.O.:400; I.V.:811; IV Piggyback:150] Out: 2150 [Urine:2150]  General: Well developed, well nourished, in no acute distress  Lungs: Clear bilaterally   Heart: Normal S1 S2. No murmurs rubs nor gallops. Pulses are 2+ & equal.  No carotid bruit. No JVD.  Abdomen: Normal  bowel sounds are positive, abdomen soft and non-tender without masses Extremities: No clubbing, cyanosis or edema. DP +1  Neuro: Normal cranial nerves; symmetric strength and tone.    Lab Results:  [facility] 11/29/10 0510 11/28/10 0527  WBC 12.6* 13.9*  HGB 9.0* 9.3*  HCT 27.5* 28.9*  PLT 449* 398   BMET  Basename 11/29/10 0510 11/28/10 0527  NA 138 134*  K 3.9 3.9  CL 104 100  CO2 24 23  GLUCOSE 107* 97  BUN 4* 4*  CREATININE 0.86 0.77  CALCIUM 9.7 9.4    Medications: I have reviewed the patient's current medications.  Assessment/Plan: Transesophageal echocardiogram performed without difficulty or apparent complications. No significant valvular abnormalities were identified, and there is no evidence for endocarditis. No abnormalities identified in the short segment of the SVC that could be imaged. 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  The recommended 6 week course of antibiotics would be effectively treat endocarditis, if present. No further cardiology testing or intervention is anticipated. Our service will be available to assist as needed.  LOS: 4 days   [place] Bing 11/29/2010, 4:28 PM

## 2010-11-30 DIAGNOSIS — I428 Other cardiomyopathies: Secondary | ICD-10-CM

## 2010-11-30 LAB — BASIC METABOLIC PANEL
CO2: 23 mEq/L (ref 19–32)
Calcium: 9.6 mg/dL (ref 8.4–10.5)
Glucose, Bld: 102 mg/dL — ABNORMAL HIGH (ref 70–99)
Sodium: 133 mEq/L — ABNORMAL LOW (ref 135–145)

## 2010-11-30 LAB — CBC
HCT: 27.7 % — ABNORMAL LOW (ref 36.0–46.0)
MCHC: 32.5 g/dL (ref 30.0–36.0)
MCV: 90.8 fL (ref 78.0–100.0)
RDW: 15.3 % (ref 11.5–15.5)

## 2010-11-30 LAB — PROTIME-INR: INR: 2.46 — ABNORMAL HIGH (ref 0.00–1.49)

## 2010-11-30 MED ORDER — WARFARIN SODIUM 5 MG PO TABS
5.0000 mg | ORAL_TABLET | Freq: Once | ORAL | Status: AC
  Administered 2010-11-30: 5 mg via ORAL
  Filled 2010-11-30: qty 1

## 2010-11-30 NOTE — Progress Notes (Addendum)
Subjective:  Doing fair-Had PICC placed yesterday, but due to discomfort was taken out.  Has no current long term access-R arm is "sore".  Has had mastectomies in the 1990 and 2005-Her oncologist is Dr. [name] receiving chemo through the Hickman catheter-Had no Radiation, which stopped in 2006?  No fever overnight, no CP, No real Nausea--had some liquids today.  Doesn't like the food much and wishes more salt.  Passing stool and urine and is passing " a lot of urine"   Objective: Weight change: -0.408 kg (-14.4 oz)  Intake/Output Summary (Last 24 hours) at 11/30/10 1019 Last data filed at 11/29/10 2300  Gross per 24 hour  Intake      0 ml  Output    150 ml  Net   -150 ml    HEENT no pallor no icterus no JVD no bruit CHEST CTAP, no tvr, no tvf CARDS S1-S2 no murmurs rubs or gallops regular rate rhthmn ABD abdomen soft nontender nondistended NEURO grossly intact throughout SKIN skin intact no rashes and no redness  Lab Results:  - Left ventricle: The cavity size was normal. Wall thickness was at the upper limits of normal. Systolic function was normal. The estimated ejection fraction was in the range of 55% to 60%. Wall motion was normal; there were no regional wall motion abnormalities. No evidence of mass. The structure previously identified near the apex appears to be a myocardial band. - Aortic valve: No evidence of vegetation. - Mitral valve: No evidence of vegetation. No evidence of vegetation. Very mild regurgitation. - Left atrium: No evidence of thrombus in the atrial cavity or appendage. No evidence of thrombus in the appendage. - Right atrium: No evidence of thrombus in the atrial cavity or appendage. - Atrial septum: No defect or patent foramen ovale was identified. 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 - Pulmonic valve: No evidence of vegetation. - Superior vena cava: Nothrombus identified. Impressions:  - No evidence of endocarditis. Compared to  the prior study performed 11/26/10, there has been no significant interval change.    Micro Results: Recent Results (from the past 240 hour(s))  CULTURE, BLOOD (ROUTINE X 2)     Status: Normal   Collection Time   11/25/10  3:03 PM      Component Value Range Status Comment   Specimen Description RIGHT ANTECUBITAL   Final    Special Requests BOTTLES DRAWN AEROBIC AND ANAEROBIC 10CC   Final    Setup Time [id number]   Final    Culture     Final    Value: STAPHYLOCOCCUS AUREUS     Note: SUSCEPTIBILITIES PERFORMED ON PREVIOUS CULTURE WITHIN THE LAST 5 DAYS.     Note: Gram Stain Report Called to,Read Back By and Verified With: SURLES Tamera Punt RN ON 11/26/10 AT 0725 BY RESSEGGER R  Performed at [facility]   Report Status 11/28/2010 FINAL   Final   CULTURE, BLOOD (ROUTINE X 2)     Status: Normal   Collection Time   11/25/10  3:15 PM      Component Value Range Status Comment   Specimen Description BLOOD LEFT ANTECUBITAL   Final    Special Requests     Final    Value: BOTTLES DRAWN AEROBIC AND ANAEROBIC AEB=12CC ANA=8CC   Setup Time [id number]   Final    Culture     Final    Value: STAPHYLOCOCCUS AUREUS     Note: RIFAMPIN AND GENTAMICIN SHOULD NOT BE USED AS SINGLE DRUGS  FOR TREATMENT OF STAPH INFECTIONS. This organism is presumed to be Clindamycin resistant based on detection of inducible Clindamycin resistance.     Note: Gram Stain Report Called to,Read Back By and Verified With: Sophronia Simas RN ON [phone] AT 0725 BY RESSEGGER R Performed at [facility]   Report Status 11/28/2010 FINAL   Final    Organism ID, Bacteria STAPHYLOCOCCUS AUREUS   Final   URINE CULTURE     Status: Normal   Collection Time   11/26/10 11:05 AM      Component Value Range Status Comment   Specimen Description URINE, CLEAN CATCH   Final    Special Requests NONE   Final    Setup Time [id number]   Final    Colony Count 60,000 COLONIES/ML   Final    Culture     Final    Value: Multiple  bacterial morphotypes present, none predominant. Suggest appropriate recollection if clinically indicated.   Report Status 11/28/2010 FINAL   Final     Studies/Results: Ct Angio Chest W/cm &/or Wo Cm  1  IMPRESSION:  1.  No embolus is observed. 2.  Fibrin sheath and probably thrombus in the distal SVC around the left subclavian catheter causes near occlusion of the SVC, with mediastinal collateral vessels noted with prominent azygos and hemiazygous contrast likely feeding into the inferior vena cava. There is abnormal stranding around the left axillary, brachial cephalic, and subclavian veins, possibly from thrombosis and inflammation.  Tumor recurrence is not excluded but is considered less likely given the constellation of findings.  I recommend left upper extremity venous ultrasound for further workup. 3.  Aberrant right subclavian artery. 4.  Emphysema. 5.  Atelectasis in the lung bases. 6.  Trace pericardial effusion.  Original Report Authenticated By: Dellia Cloud, M.D.   Dg Chest 1vsame Day  11/29/2010  *RADIOLOGY REPORT*  Clinical Data: PICC line placement  CHEST - 1 VIEW SAME DAY  Comparison: Earlier the same day  Findings: 1932 hours.  Right PICC line remains looped in the right innominate vein with the tip directed cranially in the right internal jugular vein.  This is a similar position to the prior to studies.  There is atelectasis at the right base. Interstitial markings are diffusely coarsened with chronic features. Cardiopericardial silhouette is stable. Telemetry leads overlie the chest.  IMPRESSION: Right PICC line tip remains looped upon itself in the right innominate vein with the tip directed cranially in the right internal jugular vein.  Original Report Authenticated By: ERIC A. MANSELL, M.D.   Dg Chest Port 1v Same Day  11/29/2010  *RADIOLOGY REPORT*  Clinical Data: PICC repositioning  PORTABLE CHEST - 1 VIEW SAME DAY  Comparison: 10/26 toe  Findings: Right arm PICC line  has been repositioned.  The tip now enters the jugular vein and extends up the jugular vein further than previously.  There is a loop in the PICC line extending into the SVC.  COPD.  Negative for heart failure or pneumonia.  Mild bibasilar atelectasis, unchanged.  IMPRESSION: PICC tip is now in the right internal jugular vein.  Original Report Authenticated By: Camelia Phenes, M.D.   Medications: Scheduled Meds:   . antiseptic oral rinse   Mouth Rinse BID  . aspirin EC  81 mg Oral Daily  . folic acid  1 mg Oral Daily  . metoprolol  100 mg Oral Daily  . midazolam      . nafcillin IV  2 g Intravenous Q4H  .  sodium chloride  500 mL Intravenous Once  . sodium chloride  10 mL Intracatheter Q12H  . warfarin  5 mg Oral ONCE-1800  . warfarin  5 mg Oral ONCE-1800   Continuous Infusions:   . sodium chloride 20 mL/hr at 11/29/10 0937  . sodium chloride 75 mL/hr at 11/27/10 1423  . 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 Patient will needs to be followed up by primary care physician regarding this management- the patient had a PICC line placed yesterday which was then subsequently removed  As the patient had discomfort. Dr. Suzette Battiest is on call for surgery today with regards to placement of a Port-A-Cath.  It's unlikely that patient can have Port-A-Cath placed today but we will put in a consult for the same 10 minute conversation with Dr. Baron Hamper regarding the patient. Patient is at high risk for any embolic event resulting from Port-A-Cath placement he recommends discussion with  ID and vascular surgery to determine #1)need for IV antibiotics and  #2)possible vascular intervention to take daily patient is at high risk for fetal embolic versus bleeding complications given therapeutic INR of 2.46  ANXIETY (01/01/2006)   Assessment: Anterior trazodone patient revealed  HYPERTENSION (01/01/2006)   Assessment: Relative hypotension has decreased patient is currently on metoprolol 100 mg daily.  Patient to continue on this dose of medication and hold parameters in place   COPD (01/01/2006)   Assessment: Continued when necessary nebs with ipratropium and albuterol.  Stable medical issue.  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 Status post removal of Hickman catheter and 10.23.2012 per Dr. Leticia Penna.  Appreciate input given regarding access  Shoulder region pain (11/25/2010)   Assessment: Stable.continue hydromorphone continue oxycodone    Tobacco abuse (11/25/2010)   Assessment: Patient on nicotine patch 40 mg daily patient urged to comply with cessation efforts    Hypokalemia (11/25/2010)   Assessment: Resolved   Anemia (11/25/2010)   Assessment: Stable hemoglobin drops we'll consider iron studies to determine custardlike lupus secondary to endocarditis and would be an anemia of chronic disease  Sepsis due to staphylococcus aureus (11/28/2010)   Assessment: Please see above discussion #1 and I await call from Dr. Luciana Axe.  Patient is to 2 bottles of blood cultures showing Staphylococcus aureus and further instruction regarding this.  Dr. Luciana Axe recommends at least 6 wks IV therapy and I will discuss this with the patient  Asymptomatic bacteriuria-60,000 COLONIES/ML Culture Multiple bacterial morphotypes present, none predominant. Suggest appropriate recollection if clinically indicated     LOS: 5 days   Alphons Burgert,JAI 11/30/2010, 10:19 AM

## 2010-11-30 NOTE — Progress Notes (Signed)
ANTICOAGULATION CONSULT NOTE   Pharmacy Consult for Heparin and Warfarin Indication: Subclavian vein thrombus  Allergies  Allergen Reactions  . Morphine   . Morphine And Related Itching   Patient Measurements: Height: 5' 7.5" (171.5 cm) Weight: 161 lb 12.8 oz (73.392 kg) IBW/kg (Calculated) : 62.75  Adjusted Body Weight:   Vital Signs: Temp: 98.6 F (37 C) (10/27 0600) Temp src: Oral (10/27 0600) BP: 120/73 mmHg (10/27 0600) Pulse Rate: 102  (10/27 0600)  Labs:  Basename 11/30/10 0610 11/29/10 0510 11/28/10 1506 11/28/10 0527  HGB 9.0* 9.0* -- --  HCT 27.7* 27.5* -- 28.9*  PLT 512* 449* -- 398  APTT -- -- -- --  LABPROT 27.1* 21.1* -- 17.3*  INR 2.46* 1.79* -- 1.39  HEPARINUNFRC 0.46 0.38 0.47 --  CREATININE 0.86 0.86 -- 0.77  CKTOTAL -- -- -- --  CKMB -- -- -- --  TROPONINI -- -- -- --   Estimated Creatinine Clearance: 63.8 ml/min (by C-G formula based on Cr of 0.86).  Medical History: Past Medical History  Diagnosis Date  . Breast cancer    Medications:  Scheduled:     . antiseptic oral rinse   Mouth Rinse BID  . aspirin EC  81 mg Oral Daily  . folic acid  1 mg Oral Daily  . metoprolol  100 mg Oral Daily  . midazolam      . nafcillin IV  2 g Intravenous Q4H  . sodium chloride  500 mL Intravenous Once  . sodium chloride  10 mL Intracatheter Q12H  . warfarin  5 mg Oral ONCE-1800   Infustions:     . sodium chloride 20 mL/hr at 11/29/10 0937  . sodium chloride 75 mL/hr at 11/27/10 1423  . heparin 1,700 Units/hr (11/30/10 0650)   Assessment: Heparin level and INR therapeutic on day 4 out 5 overlap for VTE treatment.    Goal of Therapy:  Heparin level 0.3-0.7 units/ml INR 2-3   Plan: Continue Heparin at current rate. Coumadin 5mg  today Heparin level daily. INR daily Monitor platelets per protocol  Lacheryl Niesen J 11/30/2010,9:00 AM

## 2010-12-01 NOTE — Progress Notes (Signed)
Subjective: Patient seen and examined. She is complaining of pain at the right upper extremity where she previously had a  PICC line which was  removed. She denies any fever of chills.  Objective: Vital signs in last 24 hours: Temp:  [97.9 F (36.6 C)-98.3 F (36.8 C)] 97.9 F (36.6 C) (10/28 0533) Pulse Rate:  [88-94] 91  (10/28 0533) Resp:  [18-20] 20  (10/28 0533) BP: (99-111)/(65-71) 111/71 mmHg (10/28 0533) SpO2:  [94 %-98 %] 98 % (10/28 0533) Weight:  [73.4 kg (161 lb 13.1 oz)] 161 lb 13.1 oz (73.4 kg) (10/28 0533) Weight change: 0.008 kg (0.3 oz) Last BM Date: 11/29/10  Intake/Output from previous day: 10/27 0701 - 10/28 0700 In: 480 [P.O.:480] Out: 1375 [Urine:1375] Total I/O In: 480 [P.O.:480] Out: 300 [Urine:300]   Physical Exam: General: Alert, awake, oriented x3, in no acute distress. HEENT: No bruits, no goiter. Heart: Regular rate and rhythm, without murmurs, rubs, gallops. Lungs: Clear to auscultation bilaterally. Abdomen: Soft, nontender, nondistended, positive bowel sounds. Extremities: No clubbing cyanosis or edema with positive pedal pulses. Right  upper extremity swelling noted.     Lab Results: No results found for this or any previous visit (from the past 24 hour(s)).  Recent Results (from the past 240 hour(s))  CULTURE, BLOOD (ROUTINE X 2)     Status: Normal   Collection Time   11/25/10  3:03 PM      Component Value Range Status Comment   Specimen Description RIGHT ANTECUBITAL   Final    Special Requests BOTTLES DRAWN AEROBIC AND ANAEROBIC 10CC   Final    Setup Time [id number]   Final    Culture     Final    Value: STAPHYLOCOCCUS AUREUS     Note: SUSCEPTIBILITIES PERFORMED ON PREVIOUS CULTURE WITHIN THE LAST 5 DAYS.     Note: Gram Stain Report Called to,Read Back By and Verified With: SURLES Tamera Punt RN ON 11/26/10 AT 0725 BY RESSEGGER R  Performed at [facility]   Report Status 11/28/2010 FINAL   Final   CULTURE, BLOOD (ROUTINE X  2)     Status: Normal   Collection Time   11/25/10  3:15 PM      Component Value Range Status Comment   Specimen Description BLOOD LEFT ANTECUBITAL   Final    Special Requests     Final    Value: BOTTLES DRAWN AEROBIC AND ANAEROBIC AEB=12CC ANA=8CC   Setup Time [id number]   Final    Culture     Final    Value: STAPHYLOCOCCUS AUREUS     Note: RIFAMPIN AND GENTAMICIN SHOULD NOT BE USED AS SINGLE DRUGS FOR TREATMENT OF STAPH INFECTIONS. This organism is presumed to be Clindamycin resistant based on detection of inducible Clindamycin resistance.     Note: Gram Stain Report Called to,Read Back By and Verified With: Sophronia Simas RN ON [phone] AT 0725 BY RESSEGGER R Performed at [facility]   Report Status 11/28/2010 FINAL   Final    Organism ID, Bacteria STAPHYLOCOCCUS AUREUS   Final   URINE CULTURE     Status: Normal   Collection Time   11/26/10 11:05 AM      Component Value Range Status Comment   Specimen Description URINE, CLEAN CATCH   Final    Special Requests NONE   Final    Setup Time [id number]   Final    Colony Count 60,000 COLONIES/ML   Final    Culture  Final    Value: Multiple bacterial morphotypes present, none predominant. Suggest appropriate recollection if clinically indicated.   Report Status 11/28/2010 FINAL   Final     Studies/Results: Dg Chest 1vsame Day  11/29/2010  *RADIOLOGY REPORT*  Clinical Data: PICC line placement  CHEST - 1 VIEW SAME DAY  Comparison: Earlier the same day  Findings: 1932 hours.  Right PICC line remains looped in the right innominate vein with the tip directed cranially in the right internal jugular vein.  This is a similar position to the prior to studies.  There is atelectasis at the right base. Interstitial markings are diffusely coarsened with chronic features. Cardiopericardial silhouette is stable. Telemetry leads overlie the chest.  IMPRESSION: Right PICC line tip remains looped upon itself in the right innominate vein with the  tip directed cranially in the right internal jugular vein.  Original Report Authenticated By: ERIC A. MANSELL, M.D.     11/29/2010  *RADIOLOGY REPORT*  Clinical Data: PICC repositioning  PORTABLE CHEST - 1 VIEW SAME DAY  Comparison: 10/26 toe  Findings: Right arm PICC line has been repositioned.  The tip now enters the jugular vein and extends up the jugular vein further than previously.  There is a loop in the PICC line extending into the SVC.  COPD.  Negative for heart failure or pneumonia.  Mild bibasilar atelectasis, unchanged.  IMPRESSION: PICC tip is now in the right internal jugular vein.  Original Report Authenticated By: Camelia Phenes, M.D.    Medications:    . antiseptic oral rinse   Mouth Rinse BID  . aspirin EC  81 mg Oral Daily  . folic acid  1 mg Oral Daily  . metoprolol  100 mg Oral Daily  . nafcillin IV  2 g Intravenous Q4H  . sodium chloride  500 mL Intravenous Once  . sodium chloride  10 mL Intracatheter Q12H  . warfarin  5 mg Oral ONCE-1800    acetaminophen, acetaminophen, albuterol, bisacodyl, docusate sodium, HYDROmorphone, ipratropium, nicotine, ondansetron (ZOFRAN) IV, ondansetron, oxyCODONE, sodium chloride, sodium phosphate, traZODone     . sodium chloride 20 mL/hr at 11/29/10 0937  . sodium chloride 75 mL/hr at 11/27/10 1423    Assessment/Plan:  Principal Problem:  *Superior vena cava obstruction with collaterals Status post removal of Hickman catheter and 10.23.2012 by Dr. Leticia Penna Continue Coumadin, INR therapeutic. Patient refused blood draw today I spoke to her  in length regarding   the importance of blood draw to monitor her INR and she verbalized understanding.  Active Problems * Sepsis due to staphylococcus aureus Continue Nafcillin to complete 6 weeks of therapy .we are currently exploring access options  * HYPERTENSION Controlled    *COPD Stable ,nebs PRN   *HX of  BREAST CANCERBREAST CANCER, HX OF Infiltrating ductal carcinoma, right  breast, T1 N1, with 1 of 9 nodes, s/p Right modified radical mastectomy, November 28 2003  To follow up with her oncologist as outpatient    Tobacco abuse:continue Nicotine patch ,counselling .   Anemia:stable      LOS: 6 days   Birdena Kingma 12/01/2010, 1:16 PM

## 2010-12-01 NOTE — Progress Notes (Addendum)
Patient refuses all sticks for labs or any reason. MD notified. Will continue to monitor. Patient refuses new PIV site.

## 2010-12-02 LAB — PROTIME-INR
INR: 1.77 — ABNORMAL HIGH (ref 0.00–1.49)
Prothrombin Time: 20.9 seconds — ABNORMAL HIGH (ref 11.6–15.2)

## 2010-12-02 LAB — CBC
Hemoglobin: 9.5 g/dL — ABNORMAL LOW (ref 12.0–15.0)
MCH: 29.6 pg (ref 26.0–34.0)
RBC: 3.21 MIL/uL — ABNORMAL LOW (ref 3.87–5.11)
WBC: 12.2 10*3/uL — ABNORMAL HIGH (ref 4.0–10.5)

## 2010-12-02 LAB — BASIC METABOLIC PANEL
CO2: 24 mEq/L (ref 19–32)
Chloride: 98 mEq/L (ref 96–112)
Glucose, Bld: 111 mg/dL — ABNORMAL HIGH (ref 70–99)
Potassium: 3.5 mEq/L (ref 3.5–5.1)
Sodium: 132 mEq/L — ABNORMAL LOW (ref 135–145)

## 2010-12-02 MED ORDER — COUMADIN BOOK
Freq: Once | Status: DC
  Filled 2010-12-02: qty 1

## 2010-12-02 MED ORDER — WARFARIN VIDEO
Freq: Once | Status: DC
  Filled 2010-12-02: qty 1

## 2010-12-02 MED ORDER — WARFARIN SODIUM 10 MG PO TABS
10.0000 mg | ORAL_TABLET | ORAL | Status: AC
  Administered 2010-12-02: 10 mg via ORAL
  Filled 2010-12-02: qty 1

## 2010-12-02 NOTE — Progress Notes (Addendum)
ANTICOAGULATION CONSULT NOTE   Pharmacy Consult for Warfarin Indication: Subclavian vein thrombus  Allergies  Allergen Reactions  . Morphine   . Morphine And Related Itching   Patient Measurements: Height: 5' 7.5" (171.5 cm) Weight: 161 lb 13.1 oz (73.4 kg) IBW/kg (Calculated) : 62.75  Adjusted Body Weight:   Vital Signs: Temp: 98.3 F (36.8 C) (10/29 0600) Temp src: Oral (10/29 0600) BP: 112/58 mmHg (10/29 0600) Pulse Rate: 92  (10/29 0600)  Labs:  Basename 12/02/10 0514 11/30/10 0610  HGB 9.5* 9.0*  HCT 29.6* 27.7*  PLT 545* 512*  APTT -- --  LABPROT 20.9* 27.1*  INR 1.77* 2.46*  HEPARINUNFRC <0.10* 0.46  CREATININE 0.88 0.86  CKTOTAL -- --  CKMB -- --  TROPONINI -- --   Estimated Creatinine Clearance: 62.3 ml/min (by C-G formula based on Cr of 0.88).  Medical History: Past Medical History  Diagnosis Date  . Breast cancer    Medications:  Scheduled:     . antiseptic oral rinse   Mouth Rinse BID  . aspirin EC  81 mg Oral Daily  . folic acid  1 mg Oral Daily  . metoprolol  100 mg Oral Daily  . nafcillin IV  2 g Intravenous Q4H  . sodium chloride  500 mL Intravenous Once  . sodium chloride  10 mL Intracatheter Q12H  . warfarin  10 mg Oral NOW   Infustions:     . sodium chloride 10 mL/hr at 12/02/10 0600  . sodium chloride 75 mL/hr at 11/27/10 1423   Assessment: Patient refused all labs on 12-01-10.  INR this morning is low.  Goal of Therapy:   INR 2-3   Plan:   Coumadin 10mg  today (now) INR daily Anticipate Warfarin 5 mg daily as home dose. Educated.  Gilman Buttner, [place] J 12/02/2010,7:59 AM

## 2010-12-02 NOTE — Progress Notes (Signed)
UR Chart Review Completed  

## 2010-12-02 NOTE — Progress Notes (Signed)
Subjective:  Patient seen and examined ,frustrated from multiple sticks for blood draws and threatening that she will not allow any more blood draws .want to go home!  Objective: Vital signs in last 24 hours: Temp:  [97.5 F (36.4 C)-98.6 F (37 C)] 97.5 F (36.4 C) (10/29 1015) Pulse Rate:  [89-97] 97  (10/29 1015) Resp:  [16-20] 18  (10/29 1015) BP: (98-126)/(58-75) 110/69 mmHg (10/29 1015) SpO2:  [92 %-95 %] 94 % (10/29 1015) Weight change:  Last BM Date: 12/01/10  Intake/Output from previous day: 10/28 0701 - 10/29 0700 In: 1200 [P.O.:1080; I.V.:120] Out: 1200 [Urine:1200]     Physical Exam: General: Alert, awake, oriented x3, in no acute distress. HEENT: No bruits, no goiter. Heart: Regular rate and rhythm, without murmurs, rubs, gallops. Lungs: Clear to auscultation bilaterally. Abdomen: Soft, nontender, nondistended, positive bowel sounds. Extremities: No clubbing cyanosis or edema with positive pedal pulses.RUE swelling decreasing .     Lab Results: Results for orders placed during the hospital encounter of 11/25/10 (from the past 24 hour(s))  HEPARIN LEVEL     Status: Abnormal   Collection Time   12/02/10  5:14 AM      Component Value Range   Heparin Unfractionated <0.10 (*) 0.30 - 0.70 (IU/mL)  PROTIME-INR     Status: Abnormal   Collection Time   12/02/10  5:14 AM      Component Value Range   Prothrombin Time 20.9 (*) 11.6 - 15.2 (seconds)   INR 1.77 (*) 0.00 - 1.49   CBC     Status: Abnormal   Collection Time   12/02/10  5:14 AM      Component Value Range   WBC 12.2 (*) 4.0 - 10.5 (K/uL)   RBC 3.21 (*) 3.87 - 5.11 (MIL/uL)   Hemoglobin 9.5 (*) 12.0 - 15.0 (g/dL)   HCT 45.4 (*) 09.8 - 46.0 (%)   MCV 92.2  78.0 - 100.0 (fL)   MCH 29.6  26.0 - 34.0 (pg)   MCHC 32.1  30.0 - 36.0 (g/dL)   RDW 11.9 (*) 14.7 - 15.5 (%)   Platelets 545 (*) 150 - 400 (K/uL)  BASIC METABOLIC PANEL     Status: Abnormal   Collection Time   12/02/10  5:14 AM   Component Value Range   Sodium 132 (*) 135 - 145 (mEq/L)   Potassium 3.5  3.5 - 5.1 (mEq/L)   Chloride 98  96 - 112 (mEq/L)   CO2 24  19 - 32 (mEq/L)   Glucose, Bld 111 (*) 70 - 99 (mg/dL)   BUN 4 (*) 6 - 23 (mg/dL)   Creatinine, Ser 8.29  0.50 - 1.10 (mg/dL)   Calcium 9.8  8.4 - 56.2 (mg/dL)   GFR calc non Af Amer 67 (*) >90 (mL/min)   GFR calc Af Amer 78 (*) >90 (mL/min)    Recent Results (from the past 240 hour(s))  CULTURE, BLOOD (ROUTINE X 2)     Status: Normal   Collection Time   11/25/10  3:03 PM      Component Value Range Status Comment   Specimen Description RIGHT ANTECUBITAL   Final    Special Requests BOTTLES DRAWN AEROBIC AND ANAEROBIC 10CC   Final    Setup Time [id number]   Final    Culture     Final    Value: STAPHYLOCOCCUS AUREUS     Note: SUSCEPTIBILITIES PERFORMED ON PREVIOUS CULTURE WITHIN THE LAST 5 DAYS.     Note: Gram Stain  Report Called to,Read Back By and Verified With: Sophronia Simas RN ON 11/26/10 AT 0725 BY RESSEGGER R  Performed at [facility]   Report Status 11/28/2010 FINAL   Final   CULTURE, BLOOD (ROUTINE X 2)     Status: Normal   Collection Time   11/25/10  3:15 PM      Component Value Range Status Comment   Specimen Description BLOOD LEFT ANTECUBITAL   Final    Special Requests     Final    Value: BOTTLES DRAWN AEROBIC AND ANAEROBIC AEB=12CC ANA=8CC   Setup Time [id number]   Final    Culture     Final    Value: STAPHYLOCOCCUS AUREUS     Note: RIFAMPIN AND GENTAMICIN SHOULD NOT BE USED AS SINGLE DRUGS FOR TREATMENT OF STAPH INFECTIONS. This organism is presumed to be Clindamycin resistant based on detection of inducible Clindamycin resistance.     Note: Gram Stain Report Called to,Read Back By and Verified With: Sophronia Simas RN ON [phone] AT 0725 BY RESSEGGER R Performed at [facility]   Report Status 11/28/2010 FINAL   Final    Organism ID, Bacteria STAPHYLOCOCCUS AUREUS   Final   URINE CULTURE     Status: Normal    Collection Time   11/26/10 11:05 AM      Component Value Range Status Comment   Specimen Description URINE, CLEAN CATCH   Final    Special Requests NONE   Final    Setup Time [id number]   Final    Colony Count 60,000 COLONIES/ML   Final    Culture     Final    Value: Multiple bacterial morphotypes present, none predominant. Suggest appropriate recollection if clinically indicated.   Report Status 11/28/2010 FINAL   Final     Studies/Results: No results found.  Medications:    . antiseptic oral rinse   Mouth Rinse BID  . aspirin EC  81 mg Oral Daily  . coumadin book   Does not apply Once  . folic acid  1 mg Oral Daily  . metoprolol  100 mg Oral Daily  . nafcillin IV  2 g Intravenous Q4H  . sodium chloride  500 mL Intravenous Once  . sodium chloride  10 mL Intracatheter Q12H  . warfarin  10 mg Oral NOW  . warfarin   Does not apply Once    acetaminophen, acetaminophen, albuterol, bisacodyl, docusate sodium, HYDROmorphone, ipratropium, nicotine, ondansetron (ZOFRAN) IV, ondansetron, oxyCODONE, sodium chloride, sodium phosphate, traZODone     . sodium chloride 10 mL/hr at 12/02/10 0600  . sodium chloride 75 mL/hr at 11/27/10 1423    Assessment/Plan:  Principal Problem:  *Superior vena cava obstruction with collaterals Continue Coumadin pharmacy is dosing, INR subtherapeutic.  *MSSA staph bacteremia/sepsis Patient is currently receiving nafcillin .Dr Commer from ID recommended 6 weeks of antibiotics. We are currently exploring access options.Discussed with Dr Caesar Bookman who does not recommend any upper extermity access because of a concern for possible dislodge of the clot .He recommended vascular surgery consult.Dr Elise Benne was subsequently  consulted and he recommended placement of femoral line by local general surgeon.Will discuss with Dr Suzette Battiest.  *Anemia;  stable   * HYPERTENSION: Well  controlled   *HX of BREAST CANCERBREAST CANCER, HX OF Infiltrating ductal  carcinoma, right breast, T1 N1, with 1 of 9 nodes, s/p Right modified radical mastectomy, November 28 2003  To follow up with her oncologist as outpatient    *Tobacco abuse: continue  with nicotine patch.  *Patient counseled on compliance with blood draws and the importance of monitoring labs while being anticoagulated .risks and benefits explained to her in details.She verbalized understanding.       LOS: 7 days   Raef Sprigg 12/02/2010, 10:56 AM

## 2010-12-03 LAB — PROTIME-INR: INR: 1.95 — ABNORMAL HIGH (ref 0.00–1.49)

## 2010-12-03 MED ORDER — WARFARIN SODIUM 5 MG PO TABS: 5.0000 mg | ORAL_TABLET | Freq: Every day | ORAL | Status: DC

## 2010-12-03 MED ORDER — CEFAZOLIN SODIUM-DEXTROSE 2-3 GM-% IV SOLR
2.0000 g | Freq: Three times a day (TID) | INTRAVENOUS | Status: DC
  Filled 2010-12-03 (×4): qty 50

## 2010-12-03 MED ORDER — COUMADIN BOOK: 1.0000 | Freq: Once | Status: DC

## 2010-12-03 MED ORDER — CEFAZOLIN SODIUM-DEXTROSE 2-3 GM-% IV SOLR: INTRAVENOUS | Status: DC

## 2010-12-03 MED ORDER — CEFAZOLIN SODIUM-DEXTROSE 2-3 GM-% IV SOLR: 2.0000 g | Freq: Three times a day (TID) | INTRAVENOUS | Status: AC

## 2010-12-03 MED ORDER — LIDOCAINE-PRILOCAINE 2.5-2.5 % EX CREA
TOPICAL_CREAM | CUTANEOUS | Status: DC | PRN
  Filled 2010-12-03: qty 5

## 2010-12-03 MED ORDER — WARFARIN SODIUM 5 MG PO TABS: ORAL_TABLET | ORAL | Status: DC

## 2010-12-03 MED ORDER — WARFARIN SODIUM 5 MG PO TABS: ORAL_TABLET | ORAL | Status: AC

## 2010-12-03 MED ORDER — SODIUM CHLORIDE 0.9 % IJ SOLN
INTRAMUSCULAR | Status: AC
  Filled 2010-12-03: qty 10

## 2010-12-03 MED ORDER — LIDOCAINE-PRILOCAINE 2.5-2.5 % EX CREA: TOPICAL_CREAM | CUTANEOUS | Status: DC | PRN

## 2010-12-03 MED ORDER — LIDOCAINE-PRILOCAINE 2.5-2.5 % EX CREA: TOPICAL_CREAM | CUTANEOUS | Status: AC | PRN

## 2010-12-03 NOTE — Progress Notes (Signed)
Arrangements complete for iv cefazolin 2 gm q 8h for 6 weeks by [facility] rn.pt d/c home

## 2010-12-03 NOTE — Progress Notes (Signed)
ANTICOAGULATION CONSULT NOTE   Pharmacy Consult for Warfarin Indication: Subclavian vein thrombus  Allergies  Allergen Reactions  . Morphine   . Morphine And Related Itching   Patient Measurements: Height: 5' 7.5" (171.5 cm) Weight: 161 lb 13.1 oz (73.4 kg) IBW/kg (Calculated) : 62.75  Adjusted Body Weight:   Vital Signs: Temp: 98 F (36.7 C) (10/30 1135) Temp src: Oral (10/30 0628) BP: 106/70 mmHg (10/30 1135) Pulse Rate: 97  (10/30 1135)  Labs:  Basename 12/03/10 1027 12/02/10 0514  HGB -- 9.5*  HCT -- 29.6*  PLT -- 545*  APTT -- --  LABPROT 22.6* 20.9*  INR 1.95* 1.77*  HEPARINUNFRC -- <0.10*  CREATININE -- 0.88  CKTOTAL -- --  CKMB -- --  TROPONINI -- --   Estimated Creatinine Clearance: 62.3 ml/min (by C-G formula based on Cr of 0.88).  Medical History: Past Medical History  Diagnosis Date  . Breast cancer    Medications:  Scheduled:     . antiseptic oral rinse   Mouth Rinse BID  . aspirin EC  81 mg Oral Daily  . ceFAZolin (ANCEF) IV  2 g Intravenous Q8H  . coumadin book   Does not apply Once  . folic acid  1 mg Oral Daily  . metoprolol  100 mg Oral Daily  . sodium chloride  500 mL Intravenous Once  . sodium chloride  10 mL Intracatheter Q12H  . warfarin  5 mg Oral q1800  . warfarin   Does not apply Once  . DISCONTD: nafcillin IV  2 g Intravenous Q4H   Infustions:     . sodium chloride 10 mL/hr at 12/02/10 0600  . sodium chloride 75 mL/hr at 11/27/10 1423   Assessment: INR slightly below goal  Goal of Therapy: INR 2-3   Plan: Coumadin 5mg  daily (home dose) INR daily until stable Educated.  Margo Aye, Llewelyn Sheaffer A 12/03/2010,11:48 AM

## 2010-12-03 NOTE — Progress Notes (Signed)
Patient educated on coumadin,viewed the video,and also given a booklet on  understanding coumadin therapy,patient verbalized understanding.

## 2010-12-03 NOTE — Discharge Summary (Signed)
Patient ID: [name] MRN: [id number] DOB/AGE: 07-Apr-1945 65 y.o.  Admit date: 11/25/2010 Discharge date: 12/03/2010  Primary Care Physician:  Evlyn Courier, MD   Discharge Diagnoses:    Present on Admission:  .Superior vena cava obstruction with collaterals .Sepsis due to staphylococcus aureus .ANXIETY .COPD .HYPERTENSION .Shoulder region pain .Tobacco abuse .Hypokalemia .Anemia   Current Discharge Medication List    START taking these medications   Details  ceFAZolin (ANCEF) 2-3 GM-% SOLR To be given by home health nurse Qty: 111 each, Refills: 0    coumadin book MISC 1 each by Does not apply route once. Qty: 1 each, Refills: 0    warfarin (COUMADIN) 5 MG tablet As instructed ,follow doctor instructions Qty: 30 tablet, Refills: 0  Emla 2.5% topical cream                       prn ,apply to site before inserting IV line.    CONTINUE these medications which have NOT CHANGED   Details  aspirin EC 81 MG tablet Take 81 mg by mouth daily.      metoprolol (TOPROL-XL) 100 MG 24 hr tablet Take 100 mg by mouth daily.           Consults:  General surgery -Dr. Kipp Brood  Cardiology -Dr Albers Bing ID-Telephone consultation with Dr Luciana Axe and Dr Orvan Falconer    Significant Diagnostic Studies:  Ct Angio Chest W/cm &/or Wo Cm  11/25/2010  *RADIOLOGY REPORT*  Clinical Data:  Shortness of breath.  Productive cough.  Back pain. History breast cancer.  CT ANGIOGRAPHY CHEST WITH CONTRAST  Technique:  Multidetector CT imaging of the chest was performed using the standard protocol during bolus administration of intravenous contrast.  Multiplanar CT image reconstructions including MIPs were obtained to evaluate the vascular anatomy.  Contrast: OMNIPAQUE IOHEXOL 350 MG/ML IV SOLN  Comparison:  Chest radiograph from 11/25/2010  Findings:  A left central line is noted with surrounding thrombus in the distal SVC. This nearly includes the distal SVC, with much of the contrast  extending from the azygos and hemiazygous systems and potentially up from the IVC, versus IVC reflux.  No abnormal contour of the interventricular septum is noted.  There is stranding around the axillary vein and left subclavian vein, which could possibly be thrombosed.  There is also some stranding around the brachiocephalic vein.  There is also an aberrant left subclavian artery which passes posterior to the esophagus.  Trace pericardial effusion noted.  No filling defect is identified in the pulmonary arterial tree to suggest pulmonary embolus.  A small subcutaneous nodular density measuring 0.8 x 0.5 cm in the right medial chest on image 45 of series 7 is probably incidental.  Mediastinal and subcarinal venous collaterals are present due to the SVC stenosis/occlusion.  Emphysema noted.  There is subsegmental atelectasis in the lingula and right lower lobe there is also mild atelectasis in the left lower lobe and right middle lobe.  No compelling findings of osseous metastatic disease noted.  Review of the MIP images confirms the above findings.  IMPRESSION:  1.  No embolus is observed. 2.  Fibrin sheath and probably thrombus in the distal SVC around the left subclavian catheter causes near occlusion of the SVC, with mediastinal collateral vessels noted with prominent azygos and hemiazygous contrast likely feeding into the inferior vena cava. There is abnormal stranding around the left axillary, brachial cephalic, and subclavian veins, possibly from thrombosis and inflammation.  Tumor recurrence  is not excluded but is considered less likely given the constellation of findings.  I recommend left upper extremity venous ultrasound for further workup. 3.  Aberrant right subclavian artery. 4.  Emphysema. 5.  Atelectasis in the lung bases. 6.  Trace pericardial effusion.  Original Report Authenticated By: Dellia Cloud, M.D.   Dg Chest Portable 1 View  11/25/2010  *RADIOLOGY REPORT*  Clinical Data: .  Shortness of breath  PORTABLE CHEST - 1 VIEW  Comparison: 03/15/2004  Findings: The lungs are clear without focal infiltrate, edema, pneumothorax or pleural effusion. The cardiopericardial silhouette is within normal limits for size.  Right subclavian catheter tip projects at the mid SVC level.  Surgical clips are noted in the right axilla.  There is some minimal atelectasis at the lung bases.  IMPRESSION: No edema or focal airspace consolidation.  Original Report Authenticated By: ERIC A. MANSELL, M.D.    2D ECHOCARDIOGRAM: Study Conclusions  - Left ventricle: The cavity size was normal. Wall thickness was normal. Systolic function was low normal. The estimated ejection fraction was in the range of 50% to 55%. Wall motion was normal; there were no regional wall motion abnormalities. No evidence of thrombus. Increase density near the apex; although this could represent prominent trabeculations and myocardial bands, cannot exclude apical mass or thrombus. - Atrial septum: No defect or patent foramen ovale was identified.    PROCEDURES: TEE Removal of Hickman cathter  RUE Piccline placement 10/26 RUE piccline removal.   Brief H and P: [name] is an 65 y.o. female. Remote history of breast cancer treated with chemotherapy; hypertension, COPD, and anxiety. Was in good state of health until she developed a flu like illness from which she is recovering, and has noticed that with the coughing she is having a left upper chest pain radiating to her shoulder, aggravated by deep breathing and movement. Pain became so severe the patient presented to the emergency room for treatment, and during her evaluation a CT angiogram of the chest was done.  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 Patient had the left sided Hickman catheter placed in 2006, to facilitate  chemotherapy.  She denies fever, she has a resolving cough, denies diaphoresis dizziness, nausea or vomiting; denies swelling of the face or headache.    Hospital Course:  Principal Problem:  *Superior vena cava obstruction with collaterals  Hickman cathter was removed. Patient was intially started on Heparin infusion which was subsequently dicontinued and continued on  Coumadin pharmacy was  dosing, INR  Today 1.95 almost therapeutic ,however goal is 2-3 . Will request repeat PT/INR in 2 days 12/05/10 to be drawn by home RN and result to be reported to her PCP .will discharge on coumadin 5 mg daily ,further adjustment of dose will be deferred to PCP ,based on INR results. *MSSA staph bacteremia/sepsis  Patient was  receiving nafcillin in the hospital 10/25 through 10/30 .Dr Commer and Dr Orvan Falconer  from ID recommended 6 weeks of IV  antibiotics. Will D/C on Ancef as recommended by ID ,2g every 8 hours  To complete 6 weeks of therapy. Access options  Are very limited .Discussed with Dr Caesar Bookman who did  not recommend any upper extermity access (no piccline,medline or porta cath) because of a concern for possible dislodge of the clot .He recommended vascular surgery consult.Dr Elise Benne was subsequently consulted and he recommended placement of femoral line  by local general surgeon.However after discussion with Dr Orvan Falconer from ID  Femoral line was not recommended and we are left with the only option to discharge with a peripheral IV line  To be changed by home agency nurse very 4-7 days TEE done and showed no vegetations.  *Anemia;  stable   * HYPERTENSION:  Well controlled ,on metoprolol *HX of BREAST CANCERBREAST CANCER, HX OF Infiltrating ductal carcinoma, right breast, T1 N1, with 1 of 9 nodes, s/p Right modified radical mastectomy, November 28 2003  Patient to follow up with her oncologist as outpatient  *Tobacco abuse: patient was prescribed  nicotine patch and counseled on smoking  cessation.   *Patient seen and examined by me today ,she is very frustrtaed of being in the hospital and requesting discharge to home ,I had a long discussion with her to explain he current medical problems andcounseled her extensively  on compliance with blood draws and the importance of monitoring labs while being anticoagulated .risks and benefits explained to her in details.She verbalized understanding.  Patient is alert and oriented x 3 and able to make her own decisions.   Filed Vitals:   12/03/10 0628  BP: 106/66  Pulse: 105  Temp: 98.9 F (37.2 C)  Resp: 20    General: Alert, awake, oriented x3, in no acute distress. Heart: Regular rate and rhythm, without murmurs, rubs, gallops. Lungs: Clear to auscultation bilaterally. Abdomen: Soft, nontender, nondistended, positive bowel sounds. Extremities: No clubbing cyanosis or edema with positive pedal pulses. Neuro: Grossly intact, nonfocal.   Disposition and Follow-up:  Follow with PCP in 1 week of discharge. Patient advised toFollow with your oncologist .  Time spent on Discharge: 60 minutes   Signed: Rashmi Tallent 12/03/2010, 11:11 AM

## 2010-12-03 NOTE — Brief Op Note (Signed)
11/25/2010 - 11/29/2010  12:57 PM  PATIENT:  [name]  65 y.o. female  PRE-OPERATIVE DIAGNOSIS:  Rule out endocarditis  POST-OPERATIVE DIAGNOSIS:  No endocarditis  PROCEDURE:  Procedure(s): TRANSESOPHAGEAL ECHOCARDIOGRAM (TEE)  SURGEON:  Surgeon(s): Gerrit Friends. Dietrich Pates, MD  PHYSICIAN ASSISTANT:   ASSISTANTS: none   ANESTHESIA:   IV sedation  EBL:  Total I/O In: 240 [P.O.:240] Out: -   BLOOD ADMINISTERED:none  DRAINS: none   LOCAL MEDICATIONS USED:  NONE  SPECIMEN:  No Specimen  DISPOSITION OF SPECIMEN:  N/A  COUNTS:  YES  TOURNIQUET:  * No tourniquets in log *  DICTATION: .Note written in EPIC  PLAN OF CARE: Patient already an inpatient  PATIENT DISPOSITION:  PACU - hemodynamically stable.   Delay start of Pharmacological VTE agent (>24hrs) due to surgical blood loss or risk of bleeding:  not applicable   No surgery performed-procedure was a transesophageal echocardiogram with a separate dictated report.

## 2010-12-05 ENCOUNTER — Encounter (HOSPITAL_COMMUNITY): Payer: Self-pay | Admitting: Cardiology

## 2016-03-07 ENCOUNTER — Emergency Department (HOSPITAL_COMMUNITY): Payer: Medicare HMO

## 2016-03-07 ENCOUNTER — Encounter (HOSPITAL_COMMUNITY): Payer: Self-pay

## 2016-03-07 ENCOUNTER — Emergency Department (HOSPITAL_COMMUNITY)
Admission: EM | Admit: 2016-03-07 | Discharge: 2016-03-07 | Disposition: A | Discharge status: Home / Self Care | Payer: Medicare HMO | Attending: Emergency Medicine | Admitting: Emergency Medicine

## 2016-03-07 DIAGNOSIS — I110 Hypertensive heart disease with heart failure: Secondary | ICD-10-CM | POA: Insufficient documentation

## 2016-03-07 DIAGNOSIS — J40 Bronchitis, not specified as acute or chronic: Secondary | ICD-10-CM | POA: Insufficient documentation

## 2016-03-07 DIAGNOSIS — Z7982 Long term (current) use of aspirin: Secondary | ICD-10-CM | POA: Diagnosis not present

## 2016-03-07 DIAGNOSIS — Z87891 Personal history of nicotine dependence: Secondary | ICD-10-CM | POA: Insufficient documentation

## 2016-03-07 DIAGNOSIS — Z79899 Other long term (current) drug therapy: Secondary | ICD-10-CM | POA: Diagnosis not present

## 2016-03-07 DIAGNOSIS — Z853 Personal history of malignant neoplasm of breast: Secondary | ICD-10-CM | POA: Diagnosis not present

## 2016-03-07 DIAGNOSIS — I509 Heart failure, unspecified: Secondary | ICD-10-CM | POA: Diagnosis not present

## 2016-03-07 DIAGNOSIS — R05 Cough: Secondary | ICD-10-CM | POA: Diagnosis not present

## 2016-03-07 DIAGNOSIS — J449 Chronic obstructive pulmonary disease, unspecified: Secondary | ICD-10-CM | POA: Insufficient documentation

## 2016-03-07 LAB — CBC WITH DIFFERENTIAL/PLATELET
BASOS ABS: 0 10*3/uL (ref 0.0–0.1)
Basophils Relative: 0 %
EOS PCT: 3 %
Eosinophils Absolute: 0.4 10*3/uL (ref 0.0–0.7)
HCT: 38.5 % (ref 36.0–46.0)
Hemoglobin: 12.6 g/dL (ref 12.0–15.0)
LYMPHS ABS: 3.5 10*3/uL (ref 0.7–4.0)
Lymphocytes Relative: 29 %
MCH: 30.4 pg (ref 26.0–34.0)
MCHC: 32.7 g/dL (ref 30.0–36.0)
MCV: 92.8 fL (ref 78.0–100.0)
MONO ABS: 1.1 10*3/uL — AB (ref 0.1–1.0)
Monocytes Relative: 9 %
Neutro Abs: 7 10*3/uL (ref 1.7–7.7)
Neutrophils Relative %: 59 %
PLATELETS: 391 10*3/uL (ref 150–400)
RBC: 4.15 MIL/uL (ref 3.87–5.11)
RDW: 13.3 % (ref 11.5–15.5)
WBC: 12 10*3/uL — ABNORMAL HIGH (ref 4.0–10.5)

## 2016-03-07 LAB — BASIC METABOLIC PANEL
Anion gap: 10 (ref 5–15)
BUN: 9 mg/dL (ref 6–20)
CO2: 29 mmol/L (ref 22–32)
Calcium: 10.1 mg/dL (ref 8.9–10.3)
Chloride: 98 mmol/L — ABNORMAL LOW (ref 101–111)
Creatinine, Ser: 0.83 mg/dL (ref 0.44–1.00)
GFR calc Af Amer: >60 mL/min (ref >60)
Glucose, Bld: 113 mg/dL — ABNORMAL HIGH (ref 65–99)
Potassium: 3 mmol/L — ABNORMAL LOW (ref 3.5–5.1)
SODIUM: 137 mmol/L (ref 135–145)

## 2016-03-07 MED ORDER — BENZONATATE 100 MG PO CAPS: 100.0000 mg | ORAL_CAPSULE | Freq: Three times a day (TID) | ORAL | 0 refills | Status: DC

## 2016-03-07 MED ORDER — PREDNISONE 20 MG PO TABS: 40.0000 mg | ORAL_TABLET | Freq: Every day | ORAL | 0 refills | Status: DC

## 2016-03-07 MED ORDER — ALBUTEROL SULFATE HFA 108 (90 BASE) MCG/ACT IN AERS: 2.0000 | INHALATION_SPRAY | RESPIRATORY_TRACT | 3 refills | Status: DC | PRN

## 2016-03-07 MED ORDER — ALBUTEROL SULFATE HFA 108 (90 BASE) MCG/ACT IN AERS
2.0000 | INHALATION_SPRAY | Freq: Once | RESPIRATORY_TRACT | Status: AC
  Administered 2016-03-07: 2 via RESPIRATORY_TRACT
  Filled 2016-03-07: qty 6.7

## 2016-03-07 MED ORDER — IOPAMIDOL (ISOVUE-370) INJECTION 76%
100.0000 mL | Freq: Once | INTRAVENOUS | Status: AC | PRN
  Administered 2016-03-07: 100 mL via INTRAVENOUS

## 2016-03-07 MED ORDER — POTASSIUM CHLORIDE ER 10 MEQ PO TBCR: 10.0000 meq | EXTENDED_RELEASE_TABLET | Freq: Every day | ORAL | 0 refills | Status: DC

## 2016-03-07 NOTE — Discharge Instructions (Signed)

## 2016-03-07 NOTE — ED Provider Notes (Signed)
[facility]-EMER[facility]Y [facility]PT Provider Note   CSN: [id number] Arrival date & time: 03/07/16  1316     History   Chief Complaint Chief Complaint  Patient presents with  . Cough    HPI [name] is a 71 y.o. female.  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  She has had a blood clot when she was younger  A couple of years ago - not on blood thinners - at the time she had a Hickman catheter for the chemo.  Sx are persistent with dry cough for 2 weeks - phelgm occasionally but no blood.  No swelling, in the legs, no orthopnea.  Past Medical History:  Diagnosis Date  . Breast cancer [facility])     Patient Active Problem List   Diagnosis Date Noted  . Non-ischemic cardiomyopathy (HCC) 11/30/2010  . Sepsis due to Staphylococcus aureus (HCC) 11/28/2010  . 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 Shoulder region pain 11/25/2010  . Tobacco abuse 11/25/2010  . Hypokalemia 11/25/2010  . Anemia 11/25/2010  . ANXIETY 01/01/2006  . HYPERTENSION 01/01/2006  . CONGESTIVE HEART FAILURE 01/01/2006  . COPD 01/01/2006  . LOW BACK PAIN 01/01/2006  . BREAST CANCER, HX OF 01/01/2006    Past Surgical History:  Procedure Laterality Date  . MASTECTOMY     bilateral  . TEE WITHOUT CARDIOVERSION  11/29/2010   Procedure: TRANSESOPHAGEAL ECHOCARDIOGRAM (TEE);  Surgeon: Gerrit Friends. Dietrich Pates, MD;  Location: [facility] ENDO SUITE;  Service: Cardiovascular;  Laterality: N/A;  . tumor removal from hip      OB History    No data available       Home Medications    Prior to Admission medications   Medication Sig Start Date End Date Taking? Authorizing Provider  aspirin EC 81 MG tablet Take 81 mg by mouth daily.     Yes Historical Provider, MD  Chlorphen-Pseudoephed-APAP (THERAFLU FLU/COLD PO) Take 1 capsule by mouth daily.   Yes  Historical Provider, MD  metoprolol (TOPROL-XL) 100 MG 24 hr tablet Take 100 mg by mouth daily.     Yes Historical Provider, MD  albuterol (PROVENTIL HFA;VENTOLIN HFA) 108 (90 Base) MCG/ACT inhaler Inhale 2 puffs into the lungs every 4 (four) hours as needed for wheezing or shortness of breath. 03/07/16   Eber Hong, MD  benzonatate (TESSALON) 100 MG capsule Take 1 capsule (100 mg total) by mouth every 8 (eight) hours. 03/07/16   Eber Hong, MD  predniSONE (DELTASONE) 20 MG tablet Take 2 tablets (40 mg total) by mouth daily. 03/07/16   Eber Hong, MD    Family History Family History  Problem Relation Age of Onset  . Anesthesia problems Neg Hx     Social History Social History  Substance Use Topics  . Smoking status: Former Smoker    Quit date: 11/21/2010  . Smokeless tobacco: Never Used  . Alcohol use No     Allergies   Morphine and Morphine and related   Review of Systems Review of Systems  All other systems reviewed and are negative.    Physical Exam Updated Vital Signs BP 136/83   Pulse 113   Temp 98.5 F (36.9 C) (Oral)   Resp 15   Ht 5\' 7"  (1.702 m)   Wt 164 lb 9.6  oz (74.7 kg)   SpO2 96%   BMI 25.78 kg/m   Physical Exam  Constitutional: She appears well-developed and well-nourished. No distress.  HENT:  Head: Normocephalic and atraumatic.  Mouth/Throat: Oropharynx is clear and moist. No oropharyngeal exudate.  Eyes: Conjunctivae and EOM are normal. Pupils are equal, round, and reactive to light. Right eye exhibits no discharge. Left eye exhibits no discharge. No scleral icterus.  Neck: Normal range of motion. Neck supple. No JVD present. No thyromegaly present.  Cardiovascular: Regular rhythm, normal heart sounds and intact distal pulses.  Exam reveals no gallop and no friction rub.   No murmur heard. tachycardia  Pulmonary/Chest: Effort normal and breath sounds normal. No respiratory distress. She has no wheezes. She has no rales.  Abdominal: Soft.  Bowel sounds are normal. She exhibits no distension and no mass. There is no tenderness.  Musculoskeletal: Normal range of motion. She exhibits no edema or tenderness.  Lymphadenopathy:    She has no cervical adenopathy.  Neurological: She is alert. Coordination normal.  Skin: Skin is warm and dry. No rash noted. No erythema.  Psychiatric: She has a normal mood and affect. Her behavior is normal.  Nursing note and vitals reviewed.    ED Treatments / Results  Labs (all labs ordered are listed, but only abnormal results are displayed) Labs Reviewed  CBC WITH DIFFERENTIAL/PLATELET - Abnormal; Notable for the following:       Result Value   WBC 12.0 (*)    Monocytes Absolute 1.1 (*)    All other components within normal limits  BASIC METABOLIC PANEL - Abnormal; Notable for the following:    Potassium 3.0 (*)    Chloride 98 (*)    Glucose, Bld 113 (*)    All other components within normal limits    EKG  EKG Interpretation None       Radiology Dg Chest 2 View  Result Date: 03/07/2016 CLINICAL DATA:  Cough for 3 weeks EXAM: CHEST  2 VIEW COMPARISON:  11/29/2010 FINDINGS: Cardiac shadow is within normal limits. Tortuous thoracic aorta is again noted. Previously seen PICC line is been removed. Bilateral axillary surgery is noted and stable. The lungs are mildly hyperinflated. No focal infiltrate or sizable effusion is seen. No bony abnormality is noted. IMPRESSION: No active cardiopulmonary disease. Electronically Signed   By: Inez Catalina M.D.   On: 03/07/2016 14:11   Ct Angio Chest Pe W Or Wo Contrast  Result Date: 03/07/2016 CLINICAL DATA:  Cough and tachycardia. History of PE. History of breast cancer. EXAM: CT ANGIOGRAPHY CHEST WITH CONTRAST TECHNIQUE: Multidetector CT imaging of the chest was performed using the standard protocol during bolus administration of intravenous contrast. Multiplanar CT image reconstructions and MIPs were obtained to evaluate the vascular anatomy.  CONTRAST:  100 mL of Isovue 370 COMPARISON:  Chest x-ray from earlier today and CT scan November 25, 2010 FINDINGS: Cardiovascular: Mild coronary artery calcifications. The thoracic aorta is non aneurysmal with no dissection. 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 No pulmonary emboli. Mediastinum/Nodes: There is abnormal soft tissue in the anterior mediastinum, immediately posterior to the sternum seen on series 6, image 87 measuring 2.7 by 2.0 cm today. Soft tissue in this region on the previous study measure 2.1 x 1.5 cm. The finding is more discrete on today's study than the previous study. The thyroid is normal. Calcified nodes are seen in the mediastinum. No other evidence of adenopathy. No effusions. The esophagus is normal. Lungs/Pleura: Central  airways are normal. No pneumothorax. Emphysematous changes are seen within the lungs. There is a ground-glass nodule in the left upper lobe on image 21 of series 7 measuring 9 mm. There may be ill defined ground-glass nodularity in the right upper lobe on series 7, image 31 measuring up to 10 mm. No solid components in either location. Atelectasis seen in the bases, right greater than left. No other nodules. No masses or suspicious infiltrates. Upper Abdomen: No acute abnormality. Musculoskeletal: No chest wall abnormality. No acute or significant osseous findings. Review of the MIP images confirms the above findings. IMPRESSION: 1. No pulmonary emboli. 2. Ground-glass nodules in the apices as above. Non-contrast chest CT at 3-6 months is recommended. If nodules persist, subsequent management will be based upon the most suspicious nodule(s). This recommendation follows the consensus statement: Guidelines for Management of Incidental Pulmonary Nodules Detected on CT Images: From the Fleischner Society 2017; Radiology 2017; 284:228-243. 3. Anterior mediastinal mass. This is more prominent in the interval, possibly due to difference in technique and  slice selection. Mild interval growth since 2012 is not completely excluded. This may represent substernal extension of the thyroid, a thymic mass, or less likely a prominent lymph node. Recommend follow-up CT in 6 months to ensure stability. Electronically Signed   By: Dorise Bullion III M.D   On: 03/07/2016 18:30    Procedures Procedures (including critical care time)  Medications Ordered in ED Medications  albuterol (PROVENTIL HFA;VENTOLIN HFA) 108 (90 Base) MCG/ACT inhaler 2 puff (not administered)  iopamidol (ISOVUE-370) 76 % injection 100 mL (100 mLs Intravenous Contrast Given 03/07/16 1748)     Initial Impression / Assessment and Plan / ED Course  I have reviewed the triage vital signs and the nursing notes.  Pertinent labs & imaging results that were available during my care of the patient were reviewed by me and considered in my medical decision making (see chart for details).     Has persisent cough Fast heart rate No pna - no ptx, r/o PE  No PE, pt given copy of CT result and explained mediastinal and lung findings - she is agreeable to outpt f/u - she has improved VS including tachycarida and at this time appears stable for d/c - will give some K for home as well.    Final Clinical Impressions(s) / ED Diagnoses   Final diagnoses:  Bronchitis    New Prescriptions New Prescriptions   ALBUTEROL (PROVENTIL HFA;VENTOLIN HFA) 108 (90 BASE) MCG/ACT INHALER    Inhale 2 puffs into the lungs every 4 (four) hours as needed for wheezing or shortness of breath.   BENZONATATE (TESSALON) 100 MG CAPSULE    Take 1 capsule (100 mg total) by mouth every 8 (eight) hours.   PREDNISONE (DELTASONE) 20 MG TABLET    Take 2 tablets (40 mg total) by mouth daily.     Noemi Chapel, MD 03/07/16 [phone]

## 2016-03-07 NOTE — ED Triage Notes (Signed)
Pt reports that she has been coughing for at least 3 weeks. Coughing up thick mucous. Denies SOB. Cough worse at night.

## 2016-09-05 ENCOUNTER — Emergency Department (HOSPITAL_COMMUNITY): Payer: Medicare HMO

## 2016-09-05 ENCOUNTER — Emergency Department (HOSPITAL_COMMUNITY)
Admission: EM | Admit: 2016-09-05 | Discharge: 2016-09-05 | Disposition: A | Discharge status: Home / Self Care | Payer: Medicare HMO | Attending: Emergency Medicine | Admitting: Emergency Medicine

## 2016-09-05 ENCOUNTER — Encounter (HOSPITAL_COMMUNITY): Payer: Self-pay | Admitting: Emergency Medicine

## 2016-09-05 DIAGNOSIS — Z87891 Personal history of nicotine dependence: Secondary | ICD-10-CM | POA: Diagnosis not present

## 2016-09-05 DIAGNOSIS — Z853 Personal history of malignant neoplasm of breast: Secondary | ICD-10-CM | POA: Diagnosis not present

## 2016-09-05 DIAGNOSIS — R590 Localized enlarged lymph nodes: Secondary | ICD-10-CM | POA: Insufficient documentation

## 2016-09-05 DIAGNOSIS — R0602 Shortness of breath: Secondary | ICD-10-CM | POA: Insufficient documentation

## 2016-09-05 DIAGNOSIS — J441 Chronic obstructive pulmonary disease with (acute) exacerbation: Secondary | ICD-10-CM | POA: Diagnosis not present

## 2016-09-05 DIAGNOSIS — I10 Essential (primary) hypertension: Secondary | ICD-10-CM | POA: Insufficient documentation

## 2016-09-05 DIAGNOSIS — Z7982 Long term (current) use of aspirin: Secondary | ICD-10-CM | POA: Diagnosis not present

## 2016-09-05 DIAGNOSIS — R05 Cough: Secondary | ICD-10-CM | POA: Diagnosis not present

## 2016-09-05 HISTORY — DX: Essential (primary) hypertension: I10

## 2016-09-05 LAB — BASIC METABOLIC PANEL
ANION GAP: 12 (ref 5–15)
BUN: 7 mg/dL (ref 6–20)
CHLORIDE: 95 mmol/L — AB (ref 101–111)
CO2: 26 mmol/L (ref 22–32)
Calcium: 9.9 mg/dL (ref 8.9–10.3)
Creatinine, Ser: 0.85 mg/dL (ref 0.44–1.00)
GFR calc Af Amer: >60 mL/min (ref >60)
GFR calc non Af Amer: >60 mL/min (ref >60)
Glucose, Bld: 111 mg/dL — ABNORMAL HIGH (ref 65–99)
Potassium: 3.1 mmol/L — ABNORMAL LOW (ref 3.5–5.1)
SODIUM: 133 mmol/L — AB (ref 135–145)

## 2016-09-05 LAB — CBC WITH DIFFERENTIAL/PLATELET
BASOS PCT: 0 %
Basophils Absolute: 0 10*3/uL (ref 0.0–0.1)
EOS ABS: 0.1 10*3/uL (ref 0.0–0.7)
Eosinophils Relative: 1 %
HCT: 30.9 % — ABNORMAL LOW (ref 36.0–46.0)
HEMOGLOBIN: 10 g/dL — AB (ref 12.0–15.0)
Lymphocytes Relative: 17 %
Lymphs Abs: 1.9 10*3/uL (ref 0.7–4.0)
MCH: 28.1 pg (ref 26.0–34.0)
MCHC: 32.4 g/dL (ref 30.0–36.0)
MCV: 86.8 fL (ref 78.0–100.0)
Monocytes Absolute: 0.8 10*3/uL (ref 0.1–1.0)
Monocytes Relative: 7 %
NEUTROS ABS: 8.8 10*3/uL — AB (ref 1.7–7.7)
NEUTROS PCT: 75 %
Platelets: 482 10*3/uL — ABNORMAL HIGH (ref 150–400)
RBC: 3.56 MIL/uL — AB (ref 3.87–5.11)
RDW: 14.3 % (ref 11.5–15.5)
WBC: 11.6 10*3/uL — AB (ref 4.0–10.5)

## 2016-09-05 LAB — TROPONIN I: Troponin I: <0.03 ng/mL (ref <0.03)

## 2016-09-05 LAB — BRAIN NATRIURETIC PEPTIDE: B NATRIURETIC PEPTIDE 5: 17 pg/mL (ref 0.0–100.0)

## 2016-09-05 MED ORDER — IPRATROPIUM-ALBUTEROL 0.5-2.5 (3) MG/3ML IN SOLN
3.0000 mL | Freq: Once | RESPIRATORY_TRACT | Status: AC
  Administered 2016-09-05: 3 mL via RESPIRATORY_TRACT
  Filled 2016-09-05: qty 3

## 2016-09-05 MED ORDER — METOPROLOL SUCCINATE ER 50 MG PO TB24
100.0000 mg | ORAL_TABLET | Freq: Once | ORAL | Status: AC
  Administered 2016-09-05: 100 mg via ORAL
  Filled 2016-09-05: qty 1

## 2016-09-05 MED ORDER — DEXTROSE 5 % IV SOLN
500.0000 mg | Freq: Once | INTRAVENOUS | Status: AC
  Administered 2016-09-05: 500 mg via INTRAVENOUS
  Filled 2016-09-05: qty 500

## 2016-09-05 MED ORDER — PREDNISONE 20 MG PO TABS: ORAL_TABLET | ORAL | 0 refills | Status: DC

## 2016-09-05 MED ORDER — PREDNISONE 50 MG PO TABS
60.0000 mg | ORAL_TABLET | Freq: Once | ORAL | Status: AC
  Administered 2016-09-05: 60 mg via ORAL
  Filled 2016-09-05: qty 1

## 2016-09-05 MED ORDER — BENZONATATE 100 MG PO CAPS: 100.0000 mg | ORAL_CAPSULE | Freq: Three times a day (TID) | ORAL | 0 refills | Status: DC | PRN

## 2016-09-05 MED ORDER — BENZONATATE 100 MG PO CAPS
100.0000 mg | ORAL_CAPSULE | Freq: Once | ORAL | Status: AC
  Administered 2016-09-05: 100 mg via ORAL
  Filled 2016-09-05: qty 1

## 2016-09-05 MED ORDER — IOPAMIDOL (ISOVUE-370) INJECTION 76%
100.0000 mL | Freq: Once | INTRAVENOUS | Status: AC | PRN
  Administered 2016-09-05: 100 mL via INTRAVENOUS

## 2016-09-05 MED ORDER — SODIUM CHLORIDE 0.9 % IV BOLUS (SEPSIS)
1000.0000 mL | Freq: Once | INTRAVENOUS | Status: AC
  Administered 2016-09-05: 1000 mL via INTRAVENOUS

## 2016-09-05 MED ORDER — AZITHROMYCIN 250 MG PO TABS: 250.0000 mg | ORAL_TABLET | Freq: Every day | ORAL | 0 refills | Status: DC

## 2016-09-05 NOTE — ED Notes (Signed)
Pt reports cough X1 month with clear sputum. Has hx of breast CA and states she has a cancerous spot on the L side that she is not being tx for.

## 2016-09-05 NOTE — ED Provider Notes (Signed)
[facility] DEPT Provider Note   CSN: [id number] Arrival date & time: 09/05/16  1301     History   Chief Complaint Chief Complaint  Patient presents with  . Cough    HPI [name] is a 71 y.o. female.  HPI  71 year old female with a past medical history of breast cancer twice with chemotherapy, surgery and resolution but now suspects that she has new breast cancer left breast tissue has not got it checked out yet and doesn't know she is going to. She also has a history of a blood clot of some type for which she is admitted to the hospital years ago but is not on any blood thinners besides aspirin. She comes in today with about a month's worth of worsening cough and shortness of breath. Is nonproductive. Seems to be worse at night. Has some mild chest discomfort sometimes but not always. She had decreased appetite significant weight loss during this time as well. She is not taking her medications this morning that she supposed to. No other associated modifying symptoms. No fevers.  Past Medical History:  Diagnosis Date  . Breast cancer ([place])   . Hypertension     Patient Active Problem List   Diagnosis Date Noted  . Non-ischemic cardiomyopathy ([place]) 11/30/2010  . Sepsis due to Staphylococcus aureus ([place]) 11/28/2010  . 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 Shoulder region pain 11/25/2010  . Tobacco abuse 11/25/2010  . Hypokalemia 11/25/2010  . Anemia 11/25/2010  . ANXIETY 01/01/2006  . HYPERTENSION 01/01/2006  . CONGESTIVE HEART FAILURE 01/01/2006  . COPD 01/01/2006  . LOW BACK PAIN 01/01/2006  . BREAST CANCER, HX OF 01/01/2006    Past Surgical History:  Procedure Laterality Date  . MASTECTOMY     bilateral  . TEE WITHOUT CARDIOVERSION  11/29/2010   Procedure: TRANSESOPHAGEAL ECHOCARDIOGRAM (TEE);  Surgeon: Cristopher Estimable. Lattie Haw, MD;  Location: [facility];  Service: Cardiovascular;  Laterality: N/A;  . tumor removal from hip      OB  History    No data available       Home Medications    Prior to Admission medications   Medication Sig Start Date End Date Taking? Authorizing Provider  aspirin EC 81 MG tablet Take 81 mg by mouth daily.     Yes [provider]  Chlorphen-Pseudoephed-APAP (THERAFLU FLU/COLD PO) Take 1 capsule by mouth daily.   Yes [provider]  metoprolol (TOPROL-XL) 100 MG 24 hr tablet Take 100 mg by mouth daily.     Yes [provider]  azithromycin (ZITHROMAX) 250 MG tablet Take 1 tablet (250 mg total) by mouth daily. Take 1 every day until finished. 09/05/16   Hildagard Sobecki, Corene Cornea, MD  predniSONE (DELTASONE) 20 MG tablet 2 tabs po daily x 4 days 09/05/16   Emil Klassen, Corene Cornea, MD    Family History Family History  Problem Relation Age of Onset  . Anesthesia problems Neg Hx     Social History Social History  Substance Use Topics  . Smoking status: Former Smoker    Quit date: 11/21/2010  . Smokeless tobacco: Never Used  . Alcohol use No     Allergies   Morphine and related   Review of Systems Review of Systems  All other systems reviewed and are negative.    Physical Exam Updated Vital Signs BP 132/77   Pulse (!) 108   Temp 98 F (36.7 C) (Oral)   Resp 16   Ht 5\' 7"  (1.702  m)   Wt 74.8 kg (165 lb)   SpO2 91%   BMI 25.84 kg/m   Physical Exam  Constitutional: She is oriented to person, place, and time. She appears well-developed and well-nourished.  HENT:  Head: Normocephalic and atraumatic.  Eyes: Conjunctivae and EOM are normal.  Neck: Normal range of motion.  Cardiovascular: Normal rate and regular rhythm.   Pulmonary/Chest: No stridor. Tachypnea noted. No respiratory distress. She has decreased breath sounds.  Abdominal: She exhibits no distension.  Neurological: She is alert and oriented to person, place, and time. No cranial nerve deficit. Coordination normal.  Skin: Skin is warm and dry.  Nursing note and vitals reviewed.    ED Treatments /  Results  Labs (all labs ordered are listed, but only abnormal results are displayed) Labs Reviewed  CBC WITH DIFFERENTIAL/PLATELET - Abnormal; Notable for the following:       Result Value   WBC 11.6 (*)    RBC 3.56 (*)    Hemoglobin 10.0 (*)    HCT 30.9 (*)    Platelets 482 (*)    Neutro Abs 8.8 (*)    All other components within normal limits  BASIC METABOLIC PANEL - Abnormal; Notable for the following:    Sodium 133 (*)    Potassium 3.1 (*)    Chloride 95 (*)    Glucose, Bld 111 (*)    All other components within normal limits  TROPONIN I  BRAIN NATRIURETIC PEPTIDE    EKG  EKG Interpretation None       Radiology Dg Chest 2 View  Result Date: 09/05/2016 CLINICAL DATA:  Productive cough, persisting for 1 month, remote history of breast cancer, status post mastectomies and axillary lymph node dissections, emphysema EXAM: CHEST  2 VIEW COMPARISON:  03/07/2016 FINDINGS: Postop changes from mastectomies and axillary lymph node dissections. Stable hyperinflation compatible with background COPD/ emphysema. No focal pneumonia, collapse or consolidation. Negative for edema, significant effusion, or pneumothorax. Normal heart size and vascularity. Aorta is ectatic and atherosclerotic. IMPRESSION: Stable postoperative findings and COPD/emphysema. No superimposed acute process or interval change. Electronically Signed   By: Jerilynn Mages.  Shick M.D.   On: 09/05/2016 13:36   Ct Angio Chest Pe W And/or Wo Contrast  Result Date: 09/05/2016 CLINICAL DATA:  Cough for the past month. History of breast cancer. Clinical concern for pulmonary embolism. EXAM: CT ANGIOGRAPHY CHEST WITH CONTRAST TECHNIQUE: Multidetector CT imaging of the chest was performed using the standard protocol during bolus administration of intravenous contrast. Multiplanar CT image reconstructions and MIPs were obtained to evaluate the vascular anatomy. CONTRAST:  100 cc Isovue 370 COMPARISON:  Chest radiographs obtained earlier today.  Chest CTA dated 03/07/2016. FINDINGS: Cardiovascular: Normally opacified pulmonary arteries with no pulmonary arterial filling defects seen. Atheromatous arterial calcifications, including the thoracic aorta. No pericardial effusion. Mediastinum/Nodes: The previously demonstrated 2.7 x 2.0 cm pretracheal superior mediastinal mass is smaller, measuring 2.2 x 1.1 cm on image number 25 of series 7. There has been interval development of a large superior mediastinal on the right, displacing the trachea and esophagus to the left. This measures 5.1 x 3.5 cm on image number 14 of series 7. On coronal image number 64 of series 10, this measures 4.3 cm in length. There is also interval left superior mediastinal mass, measuring 3.2 x 2.8 cm on image number 22 of series 7, partially encasing the great vessels. This measures 2.5 cm in length on image number 72 of series 10. There is an additional retrotracheal  mass hat above the level of the carina with some central and peripheral air or gas and low density. This measures 4.4 x 3.3 cm on image number 33 of series 7 and 5.7 cm in length on coronal image number 82 of series 10. False demonstrated is a proximal left hilar mass posterior to the left mainstem bronchus, measuring 2.7 x 1.8 cm on image number 42 of series 7 and 3.1 cm in length on coronal image number 87 of series 10. This is encasing a vessel centrally The esophagus is difficult to separate from some of these masses. There is also interval prominent anterior and lateral paraspinal soft tissue at the is T7 level with patchy sclerosis and some patchy lucency of the underlying T7 vertebral body. This soft tissue thickening measures 10 mm in maximum thickness on image number 44 of series 7. Bilateral axillary surgical clips with no enlarged axillary lymph nodes seen. Lungs/Pleura: The lungs are hyperexpanded with bilateral upper lobe bullous changes. There is also some diffuse peribronchial thickening. The previously  demonstrated 10 mm area of minimal patchy interstitial prominence in the right upper lobe has not changed significantly, measuring 11 mm in maximum diameter on image number 24 of series 9. The previously demonstrated 9 mm oval patchy interstitial opacity in the left upper lobe is no longer seen. Slightly more posteriorly at that level, there is an interval 9 mm similar area. Mild bilateral lower lobe linear atelectasis or scarring is noted. Upper Abdomen: Prominent azygos and hemiazygous veins. Musculoskeletal: Interval patchy sclerosis involving a significant portion of the T7 vertebral body and interval similar appearance in the C7, T1, T2 and T3 vertebral bodies. Bilateral postmastectomy changes. Review of the MIP images confirms the above findings. IMPRESSION: 1. No pulmonary emboli. 2. Interval multiple large mediastinal and left hilar masses, as described above. These are suspicious for metastatic adenopathy. A lymphoproliferative disorder could also have this appearance. The mass containing gas or air could at least partially represent a segment of dilated esophagus containing ingested material. Alternatively, this could represent a necrotic mass or necrotic enlarged lymph node. 3. Interval multiple sclerotic vertebral metastases. The T7 metastasis also has paraspinal extension of tumor anteriorly and laterally, greater on the right. 4. 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 5. Stable 10 mm patchy nodular density in the right upper lobe with resolution of the previously demonstrated 9 mm similar density in the left upper lobe. 6. Interval development of a new 9 mm patchy nodular density in the left upper lobe, near the previously seen 9 mm area. These could represent areas of mild focal infection or inflammation. These could also represent changes associated with patient's COPD. 7. Calcified aortic atherosclerosis. Aortic Atherosclerosis (ICD10-I70.0) and Emphysema (ICD10-J43.9). Electronically  Signed   By: Claudie Revering M.D.   On: 09/05/2016 16:31    Procedures Procedures (including critical care time)  Medications Ordered in ED Medications  azithromycin (ZITHROMAX) 500 mg in dextrose 5 % 250 mL IVPB (500 mg Intravenous New Bag/Given 09/05/16 1701)  metoprolol succinate (TOPROL-XL) 24 hr tablet 100 mg (100 mg Oral Given 09/05/16 1445)  sodium chloride 0.9 % bolus 1,000 mL (0 mLs Intravenous Stopped 09/05/16 1657)  ipratropium-albuterol (DUONEB) 0.5-2.5 (3) MG/3ML nebulizer solution 3 mL (3 mLs Nebulization Given 09/05/16 1440)  benzonatate (TESSALON) capsule 100 mg (100 mg Oral Given 09/05/16 1445)  iopamidol (ISOVUE-370) 76 % injection 100 mL (100 mLs Intravenous Contrast Given 09/05/16 1547)  predniSONE (DELTASONE) tablet 60 mg (60 mg Oral Given 09/05/16  1730)  ipratropium-albuterol (DUONEB) 0.5-2.5 (3) MG/3ML nebulizer solution 3 mL (3 mLs Nebulization Given 09/05/16 1724)     Initial Impression / Assessment and Plan / ED Course  I have reviewed the triage vital signs and the nursing notes.  Pertinent labs & imaging results that were available during my care of the patient were reviewed by me and considered in my medical decision making (see chart for details).  High risk for PE and metastatic breast cancer, will CTA.  Possibly related to reflux/copd so will try a breathing treatment.  HR may be high since she didn't take metoprolol, so will give fluids and home dose.      Heart rate improving with fluids and the metoprolol I feel it is likely because she is on her medication. However she did have improvement in her breathing and cough with the breathing treatments and the Tessalon. We'll scrub the same for home and add on some steroids. Of concern however is multiple areas within her mediastinum and Herbert no break consistent with likely metastatic cancer. I discussed with Dr. Talbert Cage who agrees to see the patient in follow-up over the next week.  Final Clinical Impressions(s) / ED  Diagnoses   Final diagnoses:  Mediastinal lymphadenopathy  COPD exacerbation (HCC)    New Prescriptions New Prescriptions   AZITHROMYCIN (ZITHROMAX) 250 MG TABLET    Take 1 tablet (250 mg total) by mouth daily. Take 1 every day until finished.   PREDNISONE (DELTASONE) 20 MG TABLET    2 tabs po daily x 4 days     Merrily Pew, MD 09/05/16 1733

## 2016-09-05 NOTE — Discharge Instructions (Signed)
I suspect you have recurrence of your breast cancer with likely spread. You need to get followed up by the cancer doctors and they will call you with an appointment this coming week. If you don't hear anything by Tuesday around noon call the office to inquire.

## 2016-09-05 NOTE — ED Triage Notes (Signed)
Patient complaining of cough x 1 month. States she is coughing up clear sputum.

## 2016-09-05 NOTE — ED Triage Notes (Signed)
Cough for 1 month  Seen here before for same No PCP will need referral

## 2016-09-18 ENCOUNTER — Ambulatory Visit (HOSPITAL_COMMUNITY): Payer: Medicare HMO

## 2016-09-22 ENCOUNTER — Encounter (HOSPITAL_COMMUNITY): Payer: Medicare HMO | Attending: Oncology | Admitting: Oncology

## 2016-09-22 ENCOUNTER — Encounter (HOSPITAL_COMMUNITY): Payer: Self-pay

## 2016-09-22 ENCOUNTER — Encounter (HOSPITAL_COMMUNITY): Payer: Medicare HMO

## 2016-09-22 DIAGNOSIS — R591 Generalized enlarged lymph nodes: Secondary | ICD-10-CM

## 2016-09-22 DIAGNOSIS — N6320 Unspecified lump in the left breast, unspecified quadrant: Secondary | ICD-10-CM

## 2016-09-22 DIAGNOSIS — R5383 Other fatigue: Secondary | ICD-10-CM

## 2016-09-22 DIAGNOSIS — Z853 Personal history of malignant neoplasm of breast: Secondary | ICD-10-CM | POA: Diagnosis not present

## 2016-09-22 DIAGNOSIS — R222 Localized swelling, mass and lump, trunk: Secondary | ICD-10-CM

## 2016-09-22 DIAGNOSIS — C7951 Secondary malignant neoplasm of bone: Secondary | ICD-10-CM

## 2016-09-22 LAB — COMPREHENSIVE METABOLIC PANEL
ALT: 14 U/L (ref 14–54)
AST: 29 U/L (ref 15–41)
Albumin: 3.7 g/dL (ref 3.5–5.0)
Alkaline Phosphatase: 178 U/L — ABNORMAL HIGH (ref 38–126)
Anion gap: 16 — ABNORMAL HIGH (ref 5–15)
BILIRUBIN TOTAL: 1.1 mg/dL (ref 0.3–1.2)
BUN: 14 mg/dL (ref 6–20)
CHLORIDE: 91 mmol/L — AB (ref 101–111)
CO2: 27 mmol/L (ref 22–32)
Calcium: 9.9 mg/dL (ref 8.9–10.3)
Creatinine, Ser: 0.8 mg/dL (ref 0.44–1.00)
GFR calc non Af Amer: >60 mL/min (ref >60)
Glucose, Bld: 123 mg/dL — ABNORMAL HIGH (ref 65–99)
POTASSIUM: 3.2 mmol/L — AB (ref 3.5–5.1)
Sodium: 134 mmol/L — ABNORMAL LOW (ref 135–145)
TOTAL PROTEIN: 8.1 g/dL (ref 6.5–8.1)

## 2016-09-22 LAB — CBC WITH DIFFERENTIAL/PLATELET
Basophils Absolute: 0 10*3/uL (ref 0.0–0.1)
Basophils Relative: 0 %
EOS PCT: 1 %
Eosinophils Absolute: 0.1 10*3/uL (ref 0.0–0.7)
HCT: 33.3 % — ABNORMAL LOW (ref 36.0–46.0)
Hemoglobin: 10.7 g/dL — ABNORMAL LOW (ref 12.0–15.0)
Lymphocytes Relative: 16 %
Lymphs Abs: 1.9 10*3/uL (ref 0.7–4.0)
MCH: 27.1 pg (ref 26.0–34.0)
MCHC: 32.1 g/dL (ref 30.0–36.0)
MCV: 84.3 fL (ref 78.0–100.0)
MONO ABS: 0.9 10*3/uL (ref 0.1–1.0)
MONOS PCT: 7 %
NEUTROS ABS: 9.5 10*3/uL — AB (ref 1.7–7.7)
NEUTROS PCT: 76 %
PLATELETS: 326 10*3/uL (ref 150–400)
RBC: 3.95 MIL/uL (ref 3.87–5.11)
RDW: 14.8 % (ref 11.5–15.5)
WBC: 12.3 10*3/uL — AB (ref 4.0–10.5)

## 2016-09-22 NOTE — Progress Notes (Signed)
[facility] Initial Visit:  Patient Care Team: Patient, No Pcp Per as PCP - General (General Practice)  CHIEF COMPLAINTS/PURPOSE OF CONSULTATION:  Left breast mass  HISTORY OF PRESENTING ILLNESS: [name] 71 y.o. female presents for evaluation of left chest wall mass. Patient has a history of right breast cancer which was diagnosed in 2005 as well as a history of left breast cancer in 1992. Patient is a poor historian and cannot give much information regarding her previous breast cancers, however she does remember receiving chemotherapy and bilateral mastectomies. She denies endocrine therapy after both of her breast cancers.   Patient stated that this time she noted a mass on her left chest wall for the past 1 month. She states that it is intermittently tender to palpation, however she has not been touching a much because she scared that it may be be recurrent breast cancer. She presented to the [facility] ED on 09/05/16 for evaluation of cough. CT angiogram of the chest on 09/05/16 demonstrated no pulmonary emboli. Interval multiple large mediastinal and left hilar masses seen which is suspicious for metastatic adenopathy. Interval multiple sclerotic vertebral metastasis. Patient states that she feels fatigued. Denies any change in appetite or weight loss. Denies any shortness breath, chest pain, abdominal pain. She states that she was having some nausea vomiting for the past 2 weeks but it has since resolved.  Review of Systems  Constitutional: Positive for fatigue. Negative for appetite change, chills and fever.  HENT:   Negative for hearing loss, lump/mass, mouth sores, sore throat and tinnitus.   Eyes: Negative for eye problems and icterus.  Respiratory: Negative for chest tightness, cough, hemoptysis, shortness of breath and wheezing.   Cardiovascular: Negative for chest pain, leg swelling and palpitations.  Gastrointestinal: Positive for nausea and vomiting. Negative  for abdominal distention, abdominal pain, blood in stool and diarrhea.  Endocrine: Negative.  Negative for hot flashes.  Genitourinary: Negative for difficulty urinating, frequency and hematuria.   Musculoskeletal: Negative for arthralgias and neck pain.  Skin: Negative for itching and rash.  Neurological: Negative for dizziness, headaches and speech difficulty.  Hematological: Negative for adenopathy. Does not bruise/bleed easily.  Psychiatric/Behavioral: Negative for confusion. The patient is not nervous/anxious.     MEDICAL HISTORY: Past Medical History:  Diagnosis Date  . Breast cancer ([place])   . Hypertension     SURGICAL HISTORY: Past Surgical History:  Procedure Laterality Date  . MASTECTOMY     bilateral  . TEE WITHOUT CARDIOVERSION  11/29/2010   Procedure: TRANSESOPHAGEAL ECHOCARDIOGRAM (TEE);  Surgeon: Cristopher Estimable. Lattie Haw, MD;  Location: [facility];  Service: Cardiovascular;  Laterality: N/A;  . tumor removal from hip      SOCIAL HISTORY: Social History   Social History  . Marital status: Legally Separated    Spouse name: N/A  . Number of children: N/A  . Years of education: N/A   Occupational History  . Nurses Aid     [employer]   Social History Main Topics  . Smoking status: Former Smoker    Quit date: 11/21/2010  . Smokeless tobacco: Never Used  . Alcohol use No  . Drug use: No  . Sexual activity: Not on file   Other Topics Concern  . Not on file   Social History Narrative   Lives in [place] Family History  Problem Relation Age of Onset  . Anesthesia problems Neg Hx     ALLERGIES:  is  allergic to morphine and related.  MEDICATIONS:  Current Outpatient Prescriptions  Medication Sig Dispense Refill  . aspirin EC 81 MG tablet Take 81 mg by mouth daily.      . metoprolol (TOPROL-XL) 100 MG 24 hr tablet Take 100 mg by mouth daily.       No current facility-administered medications for this visit.     PHYSICAL  EXAMINATION:  ECOG PERFORMANCE STATUS: 1 - Symptomatic but completely ambulatory   Vitals:   09/22/16 0854  BP: 117/79  Pulse: (!) 129  Resp: 16  SpO2: 99%    Filed Weights   09/22/16 0854  Weight: 129 lb (58.5 kg)     Physical Exam  Constitutional: She is oriented to person, place, and time and well-developed, well-nourished, and in no distress. No distress.  HENT:  Head: Normocephalic and atraumatic.  Mouth/Throat: No oropharyngeal exudate.  Eyes: Pupils are equal, round, and reactive to light. Conjunctivae are normal. No scleral icterus.  Neck: Normal range of motion. Neck supple. No JVD present.  Cardiovascular: Normal rate, regular rhythm and normal heart sounds.  Exam reveals no gallop and no friction rub.   No murmur heard. Pulmonary/Chest: Breath sounds normal. No respiratory distress. She has no wheezes. She has no rales.  Abdominal: Soft. Bowel sounds are normal. She exhibits no distension. There is no tenderness. There is no guarding.  Musculoskeletal: She exhibits no edema or tenderness.  Lymphadenopathy:    She has no cervical adenopathy.  Neurological: She is alert and oriented to person, place, and time. No cranial nerve deficit.  Skin: Skin is warm and dry. No rash noted. No erythema. No pallor.  Psychiatric: Affect and judgment normal.  CHEST WALL: bilateral mastectomies. An approximately 2.5 cm nodular mass palpated on the left lateral chest wall.   LABORATORY DATA: I have personally reviewed the data as listed:  Admission on 09/05/2016, Discharged on 09/05/2016  Component Date Value Ref Range Status  . WBC 09/05/2016 11.6* 4.0 - 10.5 K/uL Final  . RBC 09/05/2016 3.56* 3.87 - 5.11 MIL/uL Final  . Hemoglobin 09/05/2016 10.0* 12.0 - 15.0 g/dL Final  . HCT 09/05/2016 30.9* 36.0 - 46.0 % Final  . MCV 09/05/2016 86.8  78.0 - 100.0 fL Final  . MCH 09/05/2016 28.1  26.0 - 34.0 pg Final  . MCHC 09/05/2016 32.4  30.0 - 36.0 g/dL Final  . RDW 09/05/2016 14.3   11.5 - 15.5 % Final  . Platelets 09/05/2016 482* 150 - 400 K/uL Final  . Neutrophils Relative % 09/05/2016 75  % Final  . Neutro Abs 09/05/2016 8.8* 1.7 - 7.7 K/uL Final  . Lymphocytes Relative 09/05/2016 17  % Final  . Lymphs Abs 09/05/2016 1.9  0.7 - 4.0 K/uL Final  . Monocytes Relative 09/05/2016 7  % Final  . Monocytes Absolute 09/05/2016 0.8  0.1 - 1.0 K/uL Final  . Eosinophils Relative 09/05/2016 1  % Final  . Eosinophils Absolute 09/05/2016 0.1  0.0 - 0.7 K/uL Final  . Basophils Relative 09/05/2016 0  % Final  . Basophils Absolute 09/05/2016 0.0  0.0 - 0.1 K/uL Final  . Sodium 09/05/2016 133* 135 - 145 mmol/L Final  . Potassium 09/05/2016 3.1* 3.5 - 5.1 mmol/L Final  . Chloride 09/05/2016 95* 101 - 111 mmol/L Final  . CO2 09/05/2016 26  22 - 32 mmol/L Final  . Glucose, Bld 09/05/2016 111* 65 - 99 mg/dL Final  . BUN 09/05/2016 7  6 - 20 mg/dL Final  . Creatinine, Ser  09/05/2016 0.85  0.44 - 1.00 mg/dL Final  . Calcium 09/05/2016 9.9  8.9 - 10.3 mg/dL Final  . GFR calc non Af Amer 09/05/2016 >60  >60 mL/min Final  . GFR calc Af Amer 09/05/2016 >60  >60 mL/min Final   Comment: (NOTE) The eGFR has been calculated using the CKD EPI equation. This calculation has not been validated in all clinical situations. eGFR's persistently <60 mL/min signify possible Chronic Kidney Disease.   . Anion gap 09/05/2016 12  5 - 15 Final  . Troponin I 09/05/2016 <0.03  <0.03 ng/mL Final  . B Natriuretic Peptide 09/05/2016 17.0  0.0 - 100.0 pg/mL Final    RADIOGRAPHIC STUDIES: I have personally reviewed the radiological images as listed and agree with the findings in the report  Study Result   CLINICAL DATA:  Cough for the past month. History of breast cancer. Clinical concern for pulmonary embolism.  EXAM: CT ANGIOGRAPHY CHEST WITH CONTRAST  TECHNIQUE: Multidetector CT imaging of the chest was performed using the standard protocol during bolus administration of intravenous contrast.  Multiplanar CT image reconstructions and MIPs were obtained to evaluate the vascular anatomy.  CONTRAST:  100 cc Isovue 370  COMPARISON:  Chest radiographs obtained earlier today. Chest CTA dated 03/07/2016.  FINDINGS: Cardiovascular: Normally opacified pulmonary arteries with no pulmonary arterial filling defects seen. Atheromatous arterial calcifications, including the thoracic aorta. No pericardial effusion.  Mediastinum/Nodes: The previously demonstrated 2.7 x 2.0 cm pretracheal superior mediastinal mass is smaller, measuring 2.2 x 1.1 cm on image number 25 of series 7.  There has been interval development of a large superior mediastinal on the right, displacing the trachea and esophagus to the left. This measures 5.1 x 3.5 cm on image number 14 of series 7. On coronal image number 64 of series 10, this measures 4.3 cm in length.  There is also interval left superior mediastinal mass, measuring 3.2 x 2.8 cm on image number 22 of series 7, partially encasing the great vessels. This measures 2.5 cm in length on image number 72 of series 10.  There is an additional retrotracheal mass hat above the level of the carina with some central and peripheral air or gas and low density. This measures 4.4 x 3.3 cm on image number 33 of series 7 and 5.7 cm in length on coronal image number 82 of series 10.  False demonstrated is a proximal left hilar mass posterior to the left mainstem bronchus, measuring 2.7 x 1.8 cm on image number 42 of series 7 and 3.1 cm in length on coronal image number 87 of series 10. This is encasing a vessel centrally  The esophagus is difficult to separate from some of these masses.  There is also interval prominent anterior and lateral paraspinal soft tissue at the is T7 level with patchy sclerosis and some patchy lucency of the underlying T7 vertebral body. This soft tissue thickening measures 10 mm in maximum thickness on image number 44  of series 7.  Bilateral axillary surgical clips with no enlarged axillary lymph nodes seen.  Lungs/Pleura: The lungs are hyperexpanded with bilateral upper lobe bullous changes. There is also some diffuse peribronchial thickening.  The previously demonstrated 10 mm area of minimal patchy interstitial prominence in the right upper lobe has not changed significantly, measuring 11 mm in maximum diameter on image number 24 of series 9.  The previously demonstrated 9 mm oval patchy interstitial opacity in the left upper lobe is no longer seen. Slightly more posteriorly at  that level, there is an interval 9 mm similar area.  Mild bilateral lower lobe linear atelectasis or scarring is noted.  Upper Abdomen: Prominent azygos and hemiazygous veins.  Musculoskeletal: Interval patchy sclerosis involving a significant portion of the T7 vertebral body and interval similar appearance in the C7, T1, T2 and T3 vertebral bodies. Bilateral postmastectomy changes.  Review of the MIP images confirms the above findings.  IMPRESSION: 1. No pulmonary emboli. 2. Interval multiple large mediastinal and left hilar masses, as described above. These are suspicious for metastatic adenopathy. A lymphoproliferative disorder could also have this appearance. The mass containing gas or air could at least partially represent a segment of dilated esophagus containing ingested material. Alternatively, this could represent a necrotic mass or necrotic enlarged lymph node. 3. Interval multiple sclerotic vertebral metastases. The T7 metastasis also has paraspinal extension of tumor anteriorly and laterally, greater on the right. 4. 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 5. Stable 10 mm patchy nodular density in the right upper lobe with resolution of the previously demonstrated 9 mm similar density in the left upper lobe. 6. Interval development of a new 9 mm patchy nodular density in  the left upper lobe, near the previously seen 9 mm area. These could represent areas of mild focal infection or inflammation. These could also represent changes associated with patient's COPD. 7. Calcified aortic atherosclerosis.  Aortic Atherosclerosis (ICD10-I70.0) and Emphysema (ICD10-J43.9).   Electronically Signed   By: Claudie Revering M.D.   On: 09/05/2016 16:31    ASSESSMENT/PLAN 71 year old female with history of bilateral breast cancers s/p bilateral mastectomies presents for evaluation of new mass on her left chest wall. CTA demonstrates multiple mediastinal and hilar LNs which are enlarged, as well as bone metastasis.  PLAN: Will send patient stat to Dr.Jenkins for biopsy of the left breast mass. If this is confirmed breast cancer, she will need a staging PET CT to be performed. Patient does not want to receive chemotherapy again. Therefore we will need to discuss her treatment options based on her final path report and hormone profile results.  RTC in 3 weeks for follow up. Labs today.   Orders Placed This Encounter  Procedures  . CBC with Differential    Standing Status:   Future    Standing Expiration Date:   09/22/2017  . Comprehensive metabolic panel    Standing Status:   Future    Standing Expiration Date:   09/22/2017  . Cancer antigen 27.29    Standing Status:   Future    Standing Expiration Date:   09/22/2017  . Cancer antigen 15-3    Standing Status:   Future    Standing Expiration Date:   09/22/2017    All questions were answered. The patient knows to call the clinic with any problems, questions or concerns.  This note was electronically signed.    Twana First, MD  09/22/2016 9:07 AM

## 2016-09-23 ENCOUNTER — Telehealth (HOSPITAL_COMMUNITY): Payer: Self-pay

## 2016-09-23 DIAGNOSIS — R112 Nausea with vomiting, unspecified: Secondary | ICD-10-CM

## 2016-09-23 DIAGNOSIS — N6320 Unspecified lump in the left breast, unspecified quadrant: Secondary | ICD-10-CM

## 2016-09-23 DIAGNOSIS — Z853 Personal history of malignant neoplasm of breast: Secondary | ICD-10-CM

## 2016-09-23 LAB — CANCER ANTIGEN 15-3: CA 15-3: 31.5 U/mL — ABNORMAL HIGH (ref 0.0–25.0)

## 2016-09-23 LAB — CANCER ANTIGEN 27.29: CAN 27.29: 30.2 U/mL (ref 0.0–38.6)

## 2016-09-23 MED ORDER — ONDANSETRON HCL 8 MG PO TABS: 8.0000 mg | ORAL_TABLET | ORAL | 1 refills | Status: AC | PRN

## 2016-09-23 MED ORDER — ONDANSETRON HCL 8 MG PO TABS: 8.0000 mg | ORAL_TABLET | ORAL | 1 refills | Status: DC | PRN

## 2016-09-23 NOTE — Telephone Encounter (Signed)
Sharyn Lull send in a prescription for zofran 8mg  PO q4hPRN nausea #60 with 1 refill

## 2016-09-23 NOTE — Telephone Encounter (Signed)
Prescription for Zofran sent to [facility] in [place]. Called patient to let her know. She verbalized understanding and also requests a pain medication. She states she has tried Tylenol and Ibuprofen with no relief. Explained to patient I would review with MD and let her know.

## 2016-09-23 NOTE — Addendum Note (Signed)
Addended by: Jaynie Collins R on: 09/23/2016 12:44 PM   Modules accepted: Orders

## 2016-09-23 NOTE — Telephone Encounter (Signed)
Patients niece, Antionette Char, called stating patient has been nauseated and vomiting for the past 2 weeks. She is unable to keep much food or liquids down. They are requesting something to help control her nausea. Explained to niece that I would talk with Dr. Talbert Cage and call her back. She verbalized understanding.

## 2016-09-24 ENCOUNTER — Other Ambulatory Visit (HOSPITAL_COMMUNITY): Payer: Self-pay | Admitting: Oncology

## 2016-09-24 MED ORDER — OXYCODONE-ACETAMINOPHEN 5-325 MG PO TABS: 1.0000 | ORAL_TABLET | Freq: Three times a day (TID) | ORAL | 0 refills | Status: DC | PRN

## 2016-09-30 ENCOUNTER — Ambulatory Visit (INDEPENDENT_AMBULATORY_CARE_PROVIDER_SITE_OTHER): Payer: Medicare HMO | Admitting: General Surgery

## 2016-09-30 ENCOUNTER — Encounter: Payer: Self-pay | Admitting: General Surgery

## 2016-09-30 VITALS — BP 120/81 | HR 136 | Temp 98.6°F | Resp 18 | Ht 67.0 in | Wt 126.0 lb

## 2016-09-30 DIAGNOSIS — N6320 Unspecified lump in the left breast, unspecified quadrant: Secondary | ICD-10-CM | POA: Diagnosis not present

## 2016-09-30 MED ORDER — METOPROLOL TARTRATE 50 MG PO TABS: 50.0000 mg | ORAL_TABLET | Freq: Two times a day (BID) | ORAL | 1 refills | Status: DC

## 2016-09-30 NOTE — Patient Instructions (Signed)
Metoprolol tablets What is this medicine? METOPROLOL (me TOE proe lole) is a beta-blocker. Beta-blockers reduce the workload on the heart and help it to beat more regularly. This medicine is used to treat high blood pressure and to prevent chest pain. It is also used to after a heart attack and to prevent an additional heart attack from occurring. This medicine may be used for other purposes; ask your health care provider or pharmacist if you have questions. COMMON BRAND NAME(S): Lopressor What should I tell my health care provider before I take this medicine? They need to know if you have any of these conditions: -diabetes -heart or vessel disease like slow heart rate, worsening heart failure, heart block, sick sinus syndrome or Raynaud's disease -kidney disease -liver disease -lung or breathing disease, like asthma or emphysema -pheochromocytoma -thyroid disease -an unusual or allergic reaction to metoprolol, other beta-blockers, medicines, foods, dyes, or preservatives -pregnant or trying to get pregnant -breast-feeding How should I use this medicine? Take this medicine by mouth with a drink of water. Follow the directions on the prescription label. Take this medicine immediately after meals. Take your doses at regular intervals. Do not take more medicine than directed. Do not stop taking this medicine suddenly. This could lead to serious heart-related effects. Talk to your pediatrician regarding the use of this medicine in children. Special care may be needed. Overdosage: If you think you have taken too much of this medicine contact a poison control center or emergency room at once. NOTE: This medicine is only for you. Do not share this medicine with others. What if I miss a dose? If you miss a dose, take it as soon as you can. If it is almost time for your next dose, take only that dose. Do not take double or extra doses. What may interact with this medicine? This medicine may interact  with the following medications: -certain medicines for blood pressure, heart disease, irregular heart beat -certain medicines for depression like monoamine oxidase (MAO) inhibitors, fluoxetine, or paroxetine -clonidine -dobutamine -epinephrine -isoproterenol -reserpine This list may not describe all possible interactions. Give your health care provider a list of all the medicines, herbs, non-prescription drugs, or dietary supplements you use. Also tell them if you smoke, drink alcohol, or use illegal drugs. Some items may interact with your medicine. What should I watch for while using this medicine? Visit your doctor or health care professional for regular check ups. Contact your doctor right away if your symptoms worsen. Check your blood pressure and pulse rate regularly. Ask your health care professional what your blood pressure and pulse rate should be, and when you should contact them. You may get drowsy or dizzy. Do not drive, use machinery, or do anything that needs mental alertness until you know how this medicine affects you. Do not sit or stand up quickly, especially if you are an older patient. This reduces the risk of dizzy or fainting spells. Contact your doctor if these symptoms continue. Alcohol may interfere with the effect of this medicine. Avoid alcoholic drinks. What side effects may I notice from receiving this medicine? Side effects that you should report to your doctor or health care professional as soon as possible: -allergic reactions like skin rash, itching or hives -cold or numb hands or feet -depression -difficulty breathing -faint -fever with sore throat -irregular heartbeat, chest pain -rapid weight gain -swollen legs or ankles Side effects that usually do not require medical attention (report to your doctor or health care professional   if they continue or are bothersome): -anxiety or nervousness -change in sex drive or performance -dry  skin -headache -nightmares or trouble sleeping -short term memory loss -stomach upset or diarrhea -unusually tired This list may not describe all possible side effects. Call your doctor for medical advice about side effects. You may report side effects to FDA at 1-800-FDA-1088. Where should I keep my medicine? Keep out of the reach of children. Store at room temperature between 15 and 30 degrees C (59 and 86 degrees F). Throw away any unused medicine after the expiration date. NOTE: This sheet is a summary. It may not cover all possible information. If you have questions about this medicine, talk to your doctor, pharmacist, or health care provider.  2018 Elsevier/Gold Standard (2012-09-24 14:40:36)  

## 2016-10-01 NOTE — Progress Notes (Signed)
[name]; [id number]; [birth year]   HPI Patient is a 71 year old black female who was referred to my care by Dr. Talbert Cage of oncology for evaluation and treatment of a left chest wall mass. She has a history of a right breast cancer in 2005 as well as a left breast cancer 1992. Patient states that she has minimal pain from the mass.  She states she does have upper back pain. She is anxious. She states the mass was noted several months ago. Early in August, she was in the emergency room for shortness of breath. CT scan of the chest revealed multiple mediastinal and hilar masses, as well as vertebral masses. She has been referred for biopsy of the mass as there is a concern that she has recurrent breast cancer with metastatic disease. Past Medical History:  Diagnosis Date  . Breast cancer ([place])   . Hypertension     Past Surgical History:  Procedure Laterality Date  . MASTECTOMY     bilateral  . TEE WITHOUT CARDIOVERSION  11/29/2010   Procedure: TRANSESOPHAGEAL ECHOCARDIOGRAM (TEE);  Surgeon: Cristopher Estimable. Lattie Haw, MD;  Location: [facility];  Service: Cardiovascular;  Laterality: N/A;  . tumor removal from hip      Family History  Problem Relation Age of Onset  . Anesthesia problems Neg Hx     Current Outpatient Prescriptions on File Prior to Visit  Medication Sig Dispense Refill  . aspirin EC 81 MG tablet Take 81 mg by mouth daily.      . ondansetron (ZOFRAN) 8 MG tablet Take 1 tablet (8 mg total) by mouth every 4 (four) hours as needed for nausea or vomiting. 60 tablet 1  . oxyCODONE-acetaminophen (PERCOCET/ROXICET) 5-325 MG tablet Take 1 tablet by mouth every 8 (eight) hours as needed for severe pain. 40 tablet 0  . [DISCONTINUED] metoprolol (TOPROL-XL) 100 MG 24 hr tablet Take 100 mg by mouth daily.       No current facility-administered medications on file prior to visit.     Allergies  Allergen Reactions  . Morphine And Related Itching    History  Alcohol Use No     History  Smoking Status  . Former Smoker  . Quit date: 11/21/2010  Smokeless Tobacco  . Never Used    Review of Systems  Constitutional: Positive for malaise/fatigue.  HENT: Negative.   Eyes: Negative.   Respiratory: Positive for shortness of breath.   Cardiovascular: Negative.   Gastrointestinal: Negative.   Genitourinary: Negative.   Musculoskeletal: Positive for back pain and joint pain.  Skin: Negative.   Neurological: Negative.   Endo/Heme/Allergies: Negative.   Psychiatric/Behavioral: Negative.     Objective   Vitals:   09/30/16 1117  BP: 120/81  Pulse: (!) 136  Resp: 18  Temp: 98.6 F (37 C)    Physical Exam  Constitutional: She is oriented to person, place, and time.  Anxious black female who appears somewhat frail, anxious but in no acute distress.  HENT:  Head: Normocephalic and atraumatic.  Cardiovascular: Regular rhythm and normal heart sounds.  Exam reveals no gallop and no friction rub.   No murmur heard. Tachycardic  Pulmonary/Chest: Effort normal and breath sounds normal. No respiratory distress. She has no wheezes. She has no rales.  Neurological: She is alert and oriented to person, place, and time.  Skin: Skin is warm and dry.  Vitals reviewed.  breast: Bilateral mastectomies noted. 2-3 cm hard, round, erythematous mass on left anterior, lateral chest wall. Axillas negative for  palpable nodes CT scan of chest report reviewed.  Dr. Laverle Patter notes reviewed. Assessment  Mediastinal and hilar masses, history of breast cancer bilaterally, new chest wall mass, sinus tachycardia Plan   Patient states that she was on metoprolol, but took herself off of it almost 1 year ago. She has not seen a primary care physician for some time. Metoprolol 50 mg by mouth twice a day has been prescribed as her heart rate is 135. I will see her in 1 week for follow-up. Should her tachycardia be under better control, we'll then proceed with excisional biopsy of the  mass.

## 2016-10-03 ENCOUNTER — Telehealth (HOSPITAL_COMMUNITY): Payer: Self-pay

## 2016-10-03 NOTE — Telephone Encounter (Signed)
Nutrition  Patient identified on Malnutrition Screening Report for weight loss and poor appetite.   Called patient and received message that voice mail box is full and unable to leave message.    Brookes Craine B. Zenia Resides, [place], [place] Registered Dietitian [phone] (pager)

## 2016-10-07 ENCOUNTER — Ambulatory Visit (INDEPENDENT_AMBULATORY_CARE_PROVIDER_SITE_OTHER): Payer: Medicare HMO | Admitting: General Surgery

## 2016-10-07 ENCOUNTER — Encounter: Payer: Self-pay | Admitting: General Surgery

## 2016-10-07 VITALS — BP 92/59 | HR 139 | Temp 97.8°F | Resp 18 | Ht 67.0 in | Wt 121.0 lb

## 2016-10-07 DIAGNOSIS — N6320 Unspecified lump in the left breast, unspecified quadrant: Secondary | ICD-10-CM | POA: Diagnosis not present

## 2016-10-07 NOTE — Patient Instructions (Signed)
See the cardiologist in [place] office at 3:30 on Friday, 9/7

## 2016-10-07 NOTE — Progress Notes (Signed)
Subjective:     [name]  Patient is here for blood pressure check. She states she started the metoprolol as ordered. Objective:    BP (!) 92/59   Pulse (!) 139   Temp 97.8 F (36.6 C)   Resp 18   Ht 5\' 7"  (1.702 m)   Wt 121 lb (54.9 kg)   BMI 18.95 kg/m   General:  alert, cooperative and Anxious  Pulse is 139 and somewhat irregular.     Assessment:    Possible recurrent left breast carcinoma, uncontrolled heart rhythm, possible atrial fibrillation versus sinus tachycardia with PACs.    Plan:   I have arranged for a cardiology follow-up on 10/10/2016. She last saw them in 2012. I told her right needed to have her heart rate under control prior to a surgical intervention. She does not have a primary care physician. She understands this and agrees to see the cardiologist in [place] as scheduled. I will arrange her surgery once she is cleared by cardiology.

## 2016-10-10 ENCOUNTER — Encounter: Payer: Self-pay | Admitting: Cardiovascular Disease

## 2016-10-10 ENCOUNTER — Encounter (HOSPITAL_COMMUNITY): Payer: Self-pay | Admitting: Emergency Medicine

## 2016-10-10 ENCOUNTER — Ambulatory Visit (INDEPENDENT_AMBULATORY_CARE_PROVIDER_SITE_OTHER): Payer: Medicare HMO | Admitting: Cardiovascular Disease

## 2016-10-10 ENCOUNTER — Other Ambulatory Visit: Payer: Self-pay

## 2016-10-10 ENCOUNTER — Inpatient Hospital Stay (HOSPITAL_COMMUNITY)
Admission: EM | Admit: 2016-10-10 | Discharge: 2016-10-17 | DRG: 640 | Disposition: A | Discharge status: Home / Self Care | Payer: Medicare HMO | Attending: Internal Medicine | Admitting: Internal Medicine

## 2016-10-10 VITALS — BP 119/78 | HR 123 | Ht 67.0 in | Wt 118.8 lb

## 2016-10-10 DIAGNOSIS — R1110 Vomiting, unspecified: Secondary | ICD-10-CM | POA: Diagnosis not present

## 2016-10-10 DIAGNOSIS — D649 Anemia, unspecified: Secondary | ICD-10-CM | POA: Diagnosis present

## 2016-10-10 DIAGNOSIS — R64 Cachexia: Secondary | ICD-10-CM | POA: Diagnosis present

## 2016-10-10 DIAGNOSIS — E876 Hypokalemia: Secondary | ICD-10-CM | POA: Diagnosis present

## 2016-10-10 DIAGNOSIS — C799 Secondary malignant neoplasm of unspecified site: Secondary | ICD-10-CM

## 2016-10-10 DIAGNOSIS — R002 Palpitations: Secondary | ICD-10-CM | POA: Diagnosis not present

## 2016-10-10 DIAGNOSIS — E860 Dehydration: Secondary | ICD-10-CM | POA: Diagnosis present

## 2016-10-10 DIAGNOSIS — I110 Hypertensive heart disease with heart failure: Secondary | ICD-10-CM | POA: Diagnosis present

## 2016-10-10 DIAGNOSIS — R222 Localized swelling, mass and lump, trunk: Secondary | ICD-10-CM | POA: Diagnosis not present

## 2016-10-10 DIAGNOSIS — R000 Tachycardia, unspecified: Secondary | ICD-10-CM | POA: Diagnosis present

## 2016-10-10 DIAGNOSIS — E861 Hypovolemia: Secondary | ICD-10-CM | POA: Diagnosis present

## 2016-10-10 DIAGNOSIS — Z803 Family history of malignant neoplasm of breast: Secondary | ICD-10-CM | POA: Diagnosis not present

## 2016-10-10 DIAGNOSIS — E43 Unspecified severe protein-calorie malnutrition: Secondary | ICD-10-CM | POA: Diagnosis present

## 2016-10-10 DIAGNOSIS — Z885 Allergy status to narcotic agent status: Secondary | ICD-10-CM

## 2016-10-10 DIAGNOSIS — I509 Heart failure, unspecified: Secondary | ICD-10-CM | POA: Diagnosis present

## 2016-10-10 DIAGNOSIS — R0602 Shortness of breath: Secondary | ICD-10-CM | POA: Diagnosis not present

## 2016-10-10 DIAGNOSIS — Z8249 Family history of ischemic heart disease and other diseases of the circulatory system: Secondary | ICD-10-CM | POA: Diagnosis not present

## 2016-10-10 DIAGNOSIS — Z7982 Long term (current) use of aspirin: Secondary | ICD-10-CM | POA: Diagnosis not present

## 2016-10-10 DIAGNOSIS — Z66 Do not resuscitate: Secondary | ICD-10-CM | POA: Diagnosis present

## 2016-10-10 DIAGNOSIS — Z9013 Acquired absence of bilateral breasts and nipples: Secondary | ICD-10-CM | POA: Diagnosis not present

## 2016-10-10 DIAGNOSIS — I10 Essential (primary) hypertension: Secondary | ICD-10-CM | POA: Diagnosis not present

## 2016-10-10 DIAGNOSIS — Z681 Body mass index (BMI) 19 or less, adult: Secondary | ICD-10-CM | POA: Diagnosis not present

## 2016-10-10 DIAGNOSIS — D508 Other iron deficiency anemias: Secondary | ICD-10-CM | POA: Diagnosis not present

## 2016-10-10 DIAGNOSIS — J449 Chronic obstructive pulmonary disease, unspecified: Secondary | ICD-10-CM | POA: Diagnosis present

## 2016-10-10 DIAGNOSIS — C7989 Secondary malignant neoplasm of other specified sites: Secondary | ICD-10-CM | POA: Diagnosis not present

## 2016-10-10 DIAGNOSIS — D500 Iron deficiency anemia secondary to blood loss (chronic): Secondary | ICD-10-CM | POA: Diagnosis not present

## 2016-10-10 DIAGNOSIS — R112 Nausea with vomiting, unspecified: Secondary | ICD-10-CM | POA: Diagnosis present

## 2016-10-10 DIAGNOSIS — R339 Retention of urine, unspecified: Secondary | ICD-10-CM | POA: Diagnosis not present

## 2016-10-10 DIAGNOSIS — Z171 Estrogen receptor negative status [ER-]: Secondary | ICD-10-CM | POA: Diagnosis not present

## 2016-10-10 DIAGNOSIS — N6320 Unspecified lump in the left breast, unspecified quadrant: Secondary | ICD-10-CM | POA: Diagnosis not present

## 2016-10-10 DIAGNOSIS — C7951 Secondary malignant neoplasm of bone: Secondary | ICD-10-CM | POA: Diagnosis present

## 2016-10-10 DIAGNOSIS — C50812 Malignant neoplasm of overlapping sites of left female breast: Secondary | ICD-10-CM | POA: Diagnosis present

## 2016-10-10 DIAGNOSIS — Z853 Personal history of malignant neoplasm of breast: Secondary | ICD-10-CM | POA: Diagnosis not present

## 2016-10-10 DIAGNOSIS — C50919 Malignant neoplasm of unspecified site of unspecified female breast: Secondary | ICD-10-CM | POA: Diagnosis not present

## 2016-10-10 LAB — CBC
HCT: 34.5 % — ABNORMAL LOW (ref 36.0–46.0)
Hemoglobin: 10.9 g/dL — ABNORMAL LOW (ref 12.0–15.0)
MCH: 26.7 pg (ref 26.0–34.0)
MCHC: 31.6 g/dL (ref 30.0–36.0)
MCV: 84.6 fL (ref 78.0–100.0)
PLATELETS: 253 10*3/uL (ref 150–400)
RBC: 4.08 MIL/uL (ref 3.87–5.11)
RDW: 15.6 % — AB (ref 11.5–15.5)
WBC: 13.5 10*3/uL — AB (ref 4.0–10.5)

## 2016-10-10 LAB — MAGNESIUM: MAGNESIUM: 1.8 mg/dL (ref 1.7–2.4)

## 2016-10-10 LAB — COMPREHENSIVE METABOLIC PANEL
ALK PHOS: 213 U/L — AB (ref 38–126)
ALT: 17 U/L (ref 14–54)
AST: 29 U/L (ref 15–41)
Albumin: 3.7 g/dL (ref 3.5–5.0)
Anion gap: 22 — ABNORMAL HIGH (ref 5–15)
BUN: 25 mg/dL — AB (ref 6–20)
CALCIUM: 9.6 mg/dL (ref 8.9–10.3)
CHLORIDE: 92 mmol/L — AB (ref 101–111)
CO2: 24 mmol/L (ref 22–32)
CREATININE: 0.85 mg/dL (ref 0.44–1.00)
Glucose, Bld: 106 mg/dL — ABNORMAL HIGH (ref 65–99)
Potassium: 3.1 mmol/L — ABNORMAL LOW (ref 3.5–5.1)
Sodium: 138 mmol/L (ref 135–145)
Total Bilirubin: 1.5 mg/dL — ABNORMAL HIGH (ref 0.3–1.2)
Total Protein: 8 g/dL (ref 6.5–8.1)

## 2016-10-10 LAB — TSH: TSH: 0.209 u[IU]/mL — AB (ref 0.350–4.500)

## 2016-10-10 LAB — CBC WITH DIFFERENTIAL/PLATELET
BASOS ABS: 0 10*3/uL (ref 0.0–0.1)
BASOS PCT: 0 %
EOS ABS: 0 10*3/uL (ref 0.0–0.7)
Eosinophils Relative: 0 %
Lymphocytes Relative: 14 %
Lymphs Abs: 1.7 10*3/uL (ref 0.7–4.0)
MONO ABS: 1 10*3/uL (ref 0.1–1.0)
MONOS PCT: 8 %
Neutro Abs: 9.3 10*3/uL — ABNORMAL HIGH (ref 1.7–7.7)
Neutrophils Relative %: 77 %

## 2016-10-10 LAB — LIPASE, BLOOD: LIPASE: 115 U/L — AB (ref 11–51)

## 2016-10-10 LAB — PHOSPHORUS: PHOSPHORUS: 2.5 mg/dL (ref 2.5–4.6)

## 2016-10-10 MED ORDER — ONDANSETRON HCL 4 MG/2ML IJ SOLN
4.0000 mg | Freq: Four times a day (QID) | INTRAMUSCULAR | Status: DC | PRN
  Administered 2016-10-11 – 2016-10-16 (×3): 4 mg via INTRAVENOUS
  Filled 2016-10-10 (×3): qty 2

## 2016-10-10 MED ORDER — METOPROLOL TARTRATE 50 MG PO TABS: 75.0000 mg | ORAL_TABLET | Freq: Two times a day (BID) | ORAL | 6 refills | Status: DC

## 2016-10-10 MED ORDER — ACETAMINOPHEN 650 MG RE SUPP: 650.0000 mg | Freq: Four times a day (QID) | RECTAL | Status: DC | PRN

## 2016-10-10 MED ORDER — ONDANSETRON HCL 4 MG/2ML IJ SOLN
4.0000 mg | Freq: Once | INTRAMUSCULAR | Status: AC
  Administered 2016-10-10: 4 mg via INTRAVENOUS
  Filled 2016-10-10: qty 2

## 2016-10-10 MED ORDER — ACETAMINOPHEN 325 MG PO TABS
650.0000 mg | ORAL_TABLET | Freq: Four times a day (QID) | ORAL | Status: DC | PRN
  Administered 2016-10-11: 650 mg via ORAL
  Filled 2016-10-10 (×2): qty 2

## 2016-10-10 MED ORDER — BISACODYL 5 MG PO TBEC: 5.0000 mg | DELAYED_RELEASE_TABLET | Freq: Every day | ORAL | Status: DC | PRN

## 2016-10-10 MED ORDER — METOPROLOL TARTRATE 50 MG PO TABS
50.0000 mg | ORAL_TABLET | Freq: Two times a day (BID) | ORAL | Status: DC
  Filled 2016-10-10: qty 1

## 2016-10-10 MED ORDER — SODIUM CHLORIDE 0.9 % IV BOLUS (SEPSIS)
2000.0000 mL | Freq: Once | INTRAVENOUS | Status: AC
  Administered 2016-10-10: 2000 mL via INTRAVENOUS

## 2016-10-10 MED ORDER — MAGNESIUM SULFATE 2 GM/50ML IV SOLN
2.0000 g | Freq: Once | INTRAVENOUS | Status: AC
  Administered 2016-10-10: 2 g via INTRAVENOUS
  Filled 2016-10-10: qty 50

## 2016-10-10 MED ORDER — POTASSIUM CHLORIDE 10 MEQ/100ML IV SOLN
10.0000 meq | INTRAVENOUS | Status: AC
  Administered 2016-10-10 (×2): 10 meq via INTRAVENOUS
  Filled 2016-10-10 (×2): qty 100

## 2016-10-10 MED ORDER — SENNOSIDES-DOCUSATE SODIUM 8.6-50 MG PO TABS
1.0000 | ORAL_TABLET | Freq: Every evening | ORAL | Status: DC | PRN
  Filled 2016-10-10: qty 1

## 2016-10-10 MED ORDER — SODIUM CHLORIDE 0.9% FLUSH
3.0000 mL | Freq: Two times a day (BID) | INTRAVENOUS | Status: DC
  Administered 2016-10-10 – 2016-10-17 (×9): 3 mL via INTRAVENOUS

## 2016-10-10 MED ORDER — LORAZEPAM 2 MG/ML IJ SOLN
0.5000 mg | Freq: Once | INTRAMUSCULAR | Status: AC
  Administered 2016-10-10: 0.5 mg via INTRAVENOUS
  Filled 2016-10-10: qty 1

## 2016-10-10 MED ORDER — SODIUM CHLORIDE 0.9 % IV SOLN
INTRAVENOUS | Status: DC
  Administered 2016-10-10: 22:00:00 via INTRAVENOUS

## 2016-10-10 MED ORDER — ONDANSETRON HCL 4 MG PO TABS: 4.0000 mg | ORAL_TABLET | Freq: Four times a day (QID) | ORAL | Status: DC | PRN

## 2016-10-10 MED ORDER — ENOXAPARIN SODIUM 40 MG/0.4ML ~~LOC~~ SOLN
40.0000 mg | SUBCUTANEOUS | Status: DC
  Administered 2016-10-11 – 2016-10-16 (×5): 40 mg via SUBCUTANEOUS
  Filled 2016-10-10 (×7): qty 0.4

## 2016-10-10 MED ORDER — METOPROLOL TARTRATE 50 MG PO TABS
50.0000 mg | ORAL_TABLET | Freq: Once | ORAL | Status: AC
  Administered 2016-10-10: 50 mg via ORAL
  Filled 2016-10-10: qty 1

## 2016-10-10 MED ORDER — HYDROCODONE-ACETAMINOPHEN 5-325 MG PO TABS
1.0000 | ORAL_TABLET | ORAL | Status: DC | PRN
  Administered 2016-10-12 (×2): 1 via ORAL
  Administered 2016-10-15: 2 via ORAL
  Filled 2016-10-10 (×2): qty 1
  Filled 2016-10-10: qty 2
  Filled 2016-10-10: qty 1

## 2016-10-10 MED ORDER — METOPROLOL TARTRATE 50 MG PO TABS: 50.0000 mg | ORAL_TABLET | Freq: Two times a day (BID) | ORAL | Status: DC

## 2016-10-10 NOTE — ED Notes (Signed)
Gave EKG to Dr,Mesner

## 2016-10-10 NOTE — ED Provider Notes (Signed)
Emergency Department Provider Note   I have reviewed the triage vital signs and the nursing notes.   HISTORY  Chief Complaint Emesis and Tachycardia   HPI [name] is a 71 y.o. female with a recent diagnosis of breast cancer when started chemotherapy or other treatment yet presents the emergency department today secondary to vomiting and dehydration. Patient states that she's had nausea and vomiting with anything for the last few days but has been progressively worsening. She states that she's had decreased urine output but normal bowel movements. No urinary symptoms. She has no new pain. She states that sometimes the vomiting is after coughing sometimes it is random as well. Has not tried anything for the symptoms. She was at a follow-up appointment today where they noticed she had a high heart rate was sent here for further evaluation. States that she's not able to take her medications that include metoprolol. No other associated symptoms. No modifying factors. Significant amount of weight loss but unsure of amount.     Past Medical History:  Diagnosis Date  . Breast cancer ([place])   . Hypertension     Patient Active Problem List   Diagnosis Date Noted  . Sinus tachycardia 10/10/2016  . Hypovolemia associated with vomiting 10/10/2016  . Breast mass, left 09/22/2016  . Non-ischemic cardiomyopathy ([place]) 11/30/2010  . Sepsis due to Staphylococcus aureus ([place]) 11/28/2010  . 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 Shoulder region pain 11/25/2010  . Tobacco abuse 11/25/2010  . Hypokalemia 11/25/2010  . Anemia 11/25/2010  . ANXIETY 01/01/2006  . Essential hypertension 01/01/2006  . CONGESTIVE HEART FAILURE 01/01/2006  . COPD 01/01/2006  . LOW BACK PAIN 01/01/2006  . BREAST CANCER, HX OF 01/01/2006    Past Surgical History:  Procedure Laterality Date  . MASTECTOMY     bilateral  . TEE WITHOUT CARDIOVERSION  11/29/2010   Procedure:  TRANSESOPHAGEAL ECHOCARDIOGRAM (TEE);  Surgeon: Cristopher Estimable. Lattie Haw, MD;  Location: [facility];  Service: Cardiovascular;  Laterality: N/A;  . tumor removal from hip        Allergies Morphine and related  Family History  Problem Relation Age of Onset  . Congestive Heart Failure Mother   . Breast cancer Father   . Anesthesia problems Neg Hx     Social History Social History  Substance Use Topics  . Smoking status: Former Smoker    Quit date: 11/21/2010  . Smokeless tobacco: Never Used  . Alcohol use No    Review of Systems  All other systems negative except as documented in the HPI. All pertinent positives and negatives as reviewed in the HPI. ____________________________________________  PHYSICAL EXAM:  VITAL SIGNS: ED Triage Vitals [10/10/16 1645]  Enc Vitals Group     BP 93/65     Pulse Rate (!) 129     Resp 18     Temp 98 F (36.7 C)     Temp Source Oral     SpO2 95 %     Weight 118 lb (53.5 kg)     Height 5\' 7"  (1.702 m)     Head Circumference      Peak Flow      Pain Score      Pain Loc      Pain Edu?      Excl. in [place]?     Constitutional: Alert and oriented. Well appearing and in no acute distress. Eyes: Conjunctivae are normal. PERRL. EOMI. Head: Atraumatic. Nose: No congestion/rhinnorhea.  Mouth/Throat: Mucous membranes are dry.  Oropharynx non-erythematous. Neck: No stridor.  No meningeal signs.   Cardiovascular: tachycardic rate, regular rhythm. Good peripheral circulation. Grossly normal heart sounds.   Respiratory: Normal respiratory effort.  No retractions. Lungs CTAB. Gastrointestinal: Soft and nontender. No distention.  Musculoskeletal: No lower extremity tenderness nor edema. No gross deformities of extremities. Neurologic:  Normal speech and language. No gross focal neurologic deficits are appreciated.  Skin:  Skin is warm, dry and intact with decreased turgor. No rash noted.  ____________________________________________   LABS (all  labs ordered are listed, but only abnormal results are displayed)  Labs Reviewed  LIPASE, BLOOD - Abnormal; Notable for the following:       Result Value   Lipase 115 (*)    All other components within normal limits  COMPREHENSIVE METABOLIC PANEL - Abnormal; Notable for the following:    Potassium 3.1 (*)    Chloride 92 (*)    Glucose, Bld 106 (*)    BUN 25 (*)    Alkaline Phosphatase 213 (*)    Total Bilirubin 1.5 (*)    Anion gap 22 (*)    All other components within normal limits  CBC - Abnormal; Notable for the following:    WBC 13.5 (*)    Hemoglobin 10.9 (*)    HCT 34.5 (*)    RDW 15.6 (*)    All other components within normal limits  CBC WITH DIFFERENTIAL/PLATELET - Abnormal; Notable for the following:    Neutro Abs 9.3 (*)    All other components within normal limits  TSH - Abnormal; Notable for the following:    TSH 0.209 (*)    All other components within normal limits  MAGNESIUM  PHOSPHORUS  URINALYSIS, ROUTINE W REFLEX MICROSCOPIC  BASIC METABOLIC PANEL  CBC   ____________________________________________  EKG   EKG Interpretation  Date/Time:  Friday October 10 2016 16:51:04 EDT Ventricular Rate:  138 PR Interval:    QRS Duration: 84 QT Interval:  398 QTC Calculation: 602 R Axis:   30 Text Interpretation:   Critical Test Result: Long QTc Sinus Tachycardia Nonspecific ST and T wave abnormality Abnormal ECG QTc seems longer than previous Confirmed by Merrily Pew [phone]) on 10/10/2016 5:23:28 PM       EKG Interpretation  Date/Time:  Friday October 10 2016 19:31:28 EDT Ventricular Rate:  106 PR Interval:    QRS Duration: 93 QT Interval:  358 QTC Calculation: 476 R Axis:   38 Text Interpretation:  Sinus tachycardia Abnormal R-wave progression, early transition improved qtc from previous, but still borderline long No acute changes Confirmed by Merrily Pew [phone]) on 10/11/2016 12:00:29 AM        ____________________________________________  RADIOLOGY  No results found. ____________________________________________   PROCEDURES  Procedure(s) performed:   Procedures ____________________________________________   INITIAL IMPRESSION / ASSESSMENT AND PLAN / ED COURSE  Pertinent labs & imaging results that were available during my care of the patient were reviewed by me and considered in my medical decision making (see chart for details).  EKG shows a prolonged QTC [place] antibiotics difficult to choose so we'll start with Ativan. Also start fluid hydration. Suspect some of her sinus tachycardia secondary to dehydration but also not been able to keep her medications down. I will prematurely give magnesium but also check a magnesium level. We'll need to [facility] and if still prolonged with nausea vomiting may need to come in the hospital for further evaluation and workup.  QTc improved with magnesium and  fluids. HR improved some with fluids and metoprolol but still appears dehydrated, suspect likely related to mediastinal mass compressign esophagus causing her symptoms. Will plan for continued hydration, admission for further of the same.   ____________________________________________  FINAL CLINICAL IMPRESSION(S) / ED DIAGNOSES  Final diagnoses:  Vomiting, intractability of vomiting not specified, presence of nausea not specified, unspecified vomiting type  Tachycardia  Dehydration    MEDICATIONS GIVEN DURING THIS VISIT:  Medications  metoprolol tartrate (LOPRESSOR) tablet 50 mg (not administered)  enoxaparin (LOVENOX) injection 40 mg (40 mg Subcutaneous Not Given 10/10/16 2338)  sodium chloride flush (NS) 0.9 % injection 3 mL (3 mLs Intravenous Given 10/10/16 2227)  0.9 %  sodium chloride infusion ( Intravenous New Bag/Given 10/10/16 2227)  acetaminophen (TYLENOL) tablet 650 mg (not administered)    Or  acetaminophen (TYLENOL) suppository 650 mg (not administered)   HYDROcodone-acetaminophen (NORCO/VICODIN) 5-325 MG per tablet 1-2 tablet (not administered)  senna-docusate (Senokot-S) tablet 1 tablet (not administered)  bisacodyl (DULCOLAX) EC tablet 5 mg (not administered)  ondansetron (ZOFRAN) tablet 4 mg (not administered)    Or  ondansetron (ZOFRAN) injection 4 mg (not administered)  potassium chloride 10 mEq in 100 mL IVPB (10 mEq Intravenous New Bag/Given 10/10/16 2338)  magnesium sulfate IVPB 2 g 50 mL (0 g Intravenous Stopped 10/10/16 1830)  sodium chloride 0.9 % bolus 2,000 mL (0 mLs Intravenous Stopped 10/10/16 2155)  LORazepam (ATIVAN) injection 0.5 mg (0.5 mg Intravenous Given 10/10/16 1732)  ondansetron (ZOFRAN) injection 4 mg (4 mg Intravenous Given 10/10/16 2018)  metoprolol tartrate (LOPRESSOR) tablet 50 mg (50 mg Oral Given 10/10/16 2108)    NEW OUTPATIENT MEDICATIONS STARTED DURING THIS VISIT:  Current Discharge Medication List      Note:  This document was prepared using Dragon voice recognition software and may include unintentional dictation errors.    Siddarth Hsiung, Corene Cornea, MD 10/11/16 0000

## 2016-10-10 NOTE — ED Notes (Signed)
Pt encouraged to void but states she does not yet have the urge

## 2016-10-10 NOTE — H&P (Signed)
History and Physical    [name] NFA:[id number] DOB: [birth year] DOA: 10/10/2016  PCP: Patient, No Pcp Per   Patient coming from: Home, by way of cardiology clinic   Chief Complaint: Tachycardia, N/V, palpitations   HPI: [name] is a 71 y.o. female with medical history significant for remote history of left breast cancer, more recent history of right breast cancer, and hypertension, now presenting to the emergency department for evaluation of tachycardia, nausea, and vomiting. Patient was evaluated in the emergency department in 1 month ago with a cough, noted to be tachycardic, and underwent CTA chest that was negative for PE, but notable for mediastinal masses and vertebral lesions concerning for metastatic disease. This was suspected to be related to a new left breast mass and she was scheduled for biopsy of this. The surgeon found her to be tachycardic in the 130s and she was sent to the cardiology clinic for further evaluation. EKG confirms a sinus rhythm, and the tachycardia is felt to be secondary to her marked hypovolemia in the setting of nausea and vomiting, with likely contributions from her hypermetabolic state secondary to cancer, poor nutritional status, and her anemia. Patient has had ongoing nausea with vomiting for weeks, worsening. Only mild abdominal discomfort, but no pain. No melena or hematochezia. No fevers or chills.  ED Course: Upon arrival to the ED, patient is found to be afebrile, saturating well on room air, tachycardic to 130, and with vitals otherwise stable. EKG featured a sinus tachycardia with rate 138 and QTc interval prolonged to 602 ms. Chemistry panels notable for potassium of 3.1, elevated BUN/creatinine to creatinine ratio, alkaline phosphatase 213, lipase 115, and total bilirubin of 1.5. CBC is notable for a chronic leukocytosis with WBC 13,500, and a stable chronic normocytic anemia with hemoglobin of 10.9. Patient was treated with 2 L of normal  saline, 50 mg oral Lopressor, Zofran, magnesium, and Ativan in the ED. She reported feeling better with these measures and her heart rate improved, but she remains tachycardic and hypovolemic, and will be observed on telemetry unit for ongoing fluid resuscitation and monitoring of her electrolytes and heart rate.  Review of Systems:  All other systems reviewed and apart from HPI, are negative.  Past Medical History:  Diagnosis Date  . Breast cancer ([place])   . Hypertension     Past Surgical History:  Procedure Laterality Date  . MASTECTOMY     bilateral  . TEE WITHOUT CARDIOVERSION  11/29/2010   Procedure: TRANSESOPHAGEAL ECHOCARDIOGRAM (TEE);  Surgeon: Cristopher Estimable. Lattie Haw, MD;  Location[facility];  Service: Cardiovascular;  Laterality: N/A;  . tumor removal from hip       reports that she quit smoking about 5 years ago. She has never used smokeless tobacco. She reports that she does not drink alcohol or use drugs.  Allergies  Allergen Reactions  . Morphine And Related Itching    Family History  Problem Relation Age of Onset  . Congestive Heart Failure Mother   . Breast cancer Father   . Anesthesia problems Neg Hx     Social Hx:  Favorite things are her 5 grandchildren (4 girls and a boy), and cooking for family. Cornbread is her specialty; "secret is a dash of vanilla and a pinch of sugar - tastes like a cake."   Prior to Admission medications   Medication Sig Start Date End Date Taking? Authorizing Provider  aspirin EC 81 MG tablet Take 81 mg by mouth daily.  Yes [provider]  metoprolol tartrate (LOPRESSOR) 50 MG tablet Take 1 tablet (50 mg total) by mouth 2 (two) times daily. Patient taking differently: Take 50 mg by mouth daily.  10/10/16  Yes Herminio Commons, MD  ondansetron (ZOFRAN) 8 MG tablet Take 1 tablet (8 mg total) by mouth every 4 (four) hours as needed for nausea or vomiting. 09/23/16  Yes Twana First, MD  oxyCODONE-acetaminophen  (PERCOCET/ROXICET) 5-325 MG tablet Take 1 tablet by mouth every 8 (eight) hours as needed for severe pain. Patient not taking: Reported on 10/10/2016 09/24/16   Twana First, MD    Physical Exam: Vitals:   10/10/16 2100 10/10/16 2108 10/10/16 2130 10/10/16 2145  BP: 113/72 120/74 129/78   Pulse: (!) 107 (!) 109 (!) 106 (!) 105  Resp: (!) 25  (!) 27 (!) 25  Temp:      TempSrc:      SpO2: 94%  91% 94%  Weight:      Height:          Constitutional: NAD, calm, cachectic  Eyes: PERTLA, lids and conjunctivae normal ENMT: Mucous membranes are moist. Posterior pharynx clear of any exudate or lesions.   Neck: normal, supple, no masses, no thyromegaly Respiratory: clear to auscultation bilaterally, no wheezing, no crackles. Normal respiratory effort.  Cardiovascular: Rate ~120 and regular. No extremity edema. No significant JVD. Abdomen: No distension, no tenderness. Bowel sounds normal.  Musculoskeletal: no clubbing / cyanosis. No joint deformity upper and lower extremities.   Skin: no significant rashes, lesions, ulcers. Poor turgor. Neurologic: CN 2-12 grossly intact. Sensation intact. Strength 5/5 in all 4 limbs.  Psychiatric: Alert and oriented x 3. Very pleasant, cooperative.     Labs on Admission: I have personally reviewed following labs and imaging studies  CBC:  Recent Labs Lab 75/64/33 2951  WBC DUPLICATE  88.4*  NEUTROABS 9.3*  HGB DUPLICATE  16.6*  HCT DUPLICATE  06.3*  MCV DUPLICATE  01.6  PLT DUPLICATE  010   Basic Metabolic Panel:  Recent Labs Lab 10/10/16 1659  NA 138  K 3.1*  CL 92*  CO2 24  GLUCOSE 106*  BUN 25*  CREATININE 0.85  CALCIUM 9.6  MG 1.8  PHOS 2.5   GFR: Estimated Creatinine Clearance: 50.5 mL/min (by C-G formula based on SCr of 0.85 mg/dL). Liver Function Tests:  Recent Labs Lab 10/10/16 1659  AST 29  ALT 17  ALKPHOS 213*  BILITOT 1.5*  PROT 8.0  ALBUMIN 3.7    Recent Labs Lab 10/10/16 1659  LIPASE 115*   No  results for input(s): AMMONIA in the last 168 hours. Coagulation Profile: No results for input(s): INR, PROTIME in the last 168 hours. Cardiac Enzymes: No results for input(s): CKTOTAL, CKMB, CKMBINDEX, TROPONINI in the last 168 hours. BNP (last 3 results) No results for input(s): PROBNP in the last 8760 hours. HbA1C: No results for input(s): HGBA1C in the last 72 hours. CBG: No results for input(s): GLUCAP in the last 168 hours. Lipid Profile: No results for input(s): CHOL, HDL, LDLCALC, TRIG, CHOLHDL, LDLDIRECT in the last 72 hours. Thyroid Function Tests: No results for input(s): TSH, T4TOTAL, FREET4, T3FREE, THYROIDAB in the last 72 hours. Anemia Panel: No results for input(s): VITAMINB12, FOLATE, FERRITIN, TIBC, IRON, RETICCTPCT in the last 72 hours. Urine analysis:    Component Value Date/Time   COLORURINE YELLOW 11/26/2010 1105   APPEARANCEUR CLEAR 11/26/2010 1105   LABSPEC <1.005 (L) 11/26/2010 1105   PHURINE 6.5 11/26/2010 1105  GLUCOSEU NEGATIVE 11/26/2010 1105   HGBUR SMALL (A) 11/26/2010 1105   BILIRUBINUR NEGATIVE 11/26/2010 1105   KETONESUR NEGATIVE 11/26/2010 1105   PROTEINUR NEGATIVE 11/26/2010 1105   UROBILINOGEN 4.0 (H) 11/26/2010 1105   NITRITE NEGATIVE 11/26/2010 1105   LEUKOCYTESUR NEGATIVE 11/26/2010 1105   Sepsis Labs: @LABRCNTIP (procalcitonin:4,lacticidven:4) )No results found for this or any previous visit (from the past 240 hour(s)).   Radiological Exams on Admission: No results found.  EKG: Independently reviewed. Sinus tachycardia (rate 138), QTc 602 ms. Repeat EKG after ED treatments: Sinus tachycardia (rate 106), QTc 476 ms.  Assessment/Plan  1. Sinus tachycardia  - Pt presents with tachycardia to 130's  - She had been scheduled for biopsy of left breast mass, but was tachycardic and sent to cardiology clinic where it was determined to be sinus and attributed in large part to her hypovolemia from N/V  - Recent CTA chest negative for PE,  but notable for apparent metastatic disease  - Cardiology has recommended IVF hydration and scheduled metoprolol  - She was treated in ED with 2 liters NS, magnesium, Zofran, metoprolol, and Ativan - She improved with ED treatments, but remains tachycardic and appears hypovolemic  - Plan to continue IVF overnight, continue cardiac monitoring, repeat chemistries in am, check TSH    2. Nausea/vomiting with hypovolemia  - Suspected secondary to the apparent metastatic disease wtih mediastinal masses displacing esophagus  - Improved with Zofran in ED  - Plan to continue fluid-resuscitation and antiemetics, repeat chemistries in am    3. Anemia  - Hgb is 10.9 on admission, stable relative to priors and with no bleeding evident  - Do not anticipate transfusion needs this admission    4. Breast cancer  - Pt has hx of cancer involving right breast in 2005; she had left breast cancer in early 1990's  - She was seen in ED last month with cough and tachycardia; CTA at that time negative for PE, but notable for apparent metastatic LN's and vertebral lesions  - She was scheduled for biopsy of new left breast mass concerning for recurrence, but was tachycardic in 130's and sent to see cardiology    5. Hypokalemia - Serum potassium is 3.1 on admission in setting of N/V  - Treated with 20 mEq IV potassium given poor tolerance of pills  - Continue cardiac monitoring, treat nausea, repeat chem panel in am    DVT prophylaxis: sq Lovenox Code Status: Full  Family Communication: Discussed with patient Disposition Plan: Observe on telemetry  Consults called: None Admission status: Observation    Vianne Bulls, MD [facility] Pager [phone]  If 7PM-7AM, please contact night-coverage [email or web address] Password [facility]  10/10/2016, 9:57 PM

## 2016-10-10 NOTE — ED Notes (Signed)
Report to Aisha, RN

## 2016-10-10 NOTE — ED Triage Notes (Signed)
Patient was sent to ED by Dr. Arnoldo Morale for tahcycardia. Patient has left sided breast cancer. Patient states that she has not been able to eat or drink anything. She states she has not undergone chemotherapy yet.

## 2016-10-10 NOTE — ED Notes (Signed)
Pt reports that she feels much better

## 2016-10-10 NOTE — Addendum Note (Signed)
Addended by: Laurine Blazer on: 10/10/2016 04:12 PM   Modules accepted: Orders

## 2016-10-10 NOTE — ED Notes (Signed)
Pt reports that she has known she has had L breast cancer for the last 10 weeks She has not been able to receive cancer treatment due to her high heart rate. She reports that she was seen by her heart doctor and sent here because she was so dehydrated. She reports urination and reports she is dehydrated due to her untreated cancer

## 2016-10-10 NOTE — Patient Instructions (Addendum)
Medication Instructions:   Continue the Lopressor at 50mg  twice a day.    Continue all other medications.    Labwork: none  Testing/Procedures: none  Follow-Up: As needed   Any Other Special Instructions Will Be Listed Below (If Applicable).  If you need a refill on your cardiac medications before your next appointment, please call your pharmacy.

## 2016-10-10 NOTE — Progress Notes (Signed)
CARDIOLOGY CONSULT NOTE  Patient ID: [name] MRN: [id number] DOB/AGE: 1945-10-12 71 y.o.  Admit date: (Not on file) Primary Physician: Patient, No Pcp Per Referring Physician: Aviva Signs  Reason for Consultation: arrhythmia  HPI: [name] is a 71 y.o. female who is being seen today for the evaluation of arrhythmia at the request of Dr. Aviva Signs.  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 She saw Dr. Arnoldo Morale on 10/07/16. She was noted to be tachycardic.  She has a history of right-sided breast cancer diagnosed in 2005 as well as a history of left breast cancer 1992.  She was evaluated in the ED for a cough on 09/05/16 and heart rate was 108 bpm.  ECG performed on 09/05/16 which I personally interpreted demonstrated sinus tachycardia, 115 bpm.  ECG performed on 03/07/16 which I also personally interpreted demonstrated sinus tachycardia, 112 bpm.  CT angiogram of the chest on 09/05/16 showed no evidence of pulmonary embolism. It did show metastatic adenopathy and vertebral metastasis as well as emphysema and several other pulmonary findings.  Labs 09/22/16: White blood cells 12.3, hemoglobin 10.7, platelets 326, potassium 3.2, BUN 14, creatinine 0.8.  BNP 17 on 09/05/16.  The patient does experience palpitations.  She is present with a close friend from church. Her friend says that the patient appears dehydrated to her. She said she drinks less than 8 ounces of fluids daily and also does not eat much of anything.  ECG performed in the office today which I ordered personally interpreted demonstrated sinus tachycardia, 139 bpm, nonspecific T wave abnormalities, with a rightward axis.  Allergies  Allergen Reactions  . Morphine And Related Itching    Current Outpatient Prescriptions  Medication Sig Dispense Refill  . aspirin EC 81 MG tablet Take 81 mg by mouth daily.      . metoprolol tartrate (LOPRESSOR) 50 MG  tablet Take 1 tablet (50 mg total) by mouth 2 (two) times daily. 60 tablet 1  . ondansetron (ZOFRAN) 8 MG tablet Take 1 tablet (8 mg total) by mouth every 4 (four) hours as needed for nausea or vomiting. 60 tablet 1  . oxyCODONE-acetaminophen (PERCOCET/ROXICET) 5-325 MG tablet Take 1 tablet by mouth every 8 (eight) hours as needed for severe pain. 40 tablet 0   No current facility-administered medications for this visit.     Past Medical History:  Diagnosis Date  . Breast cancer ([place])   . Hypertension     Past Surgical History:  Procedure Laterality Date  . MASTECTOMY     bilateral  . TEE WITHOUT CARDIOVERSION  11/29/2010   Procedure: TRANSESOPHAGEAL ECHOCARDIOGRAM (TEE);  Surgeon: Cristopher Estimable. Lattie Haw, MD;  Location: [facility];  Service: Cardiovascular;  Laterality: N/A;  . tumor removal from hip      Social History   Social History  . Marital status: Legally Separated    Spouse name: N/A  . Number of children: N/A  . Years of education: N/A   Occupational History  . Nurses Aid     [employer]   Social History Main Topics  . Smoking status: Former Smoker    Quit date: 11/21/2010  . Smokeless tobacco: Never Used  . Alcohol use No  . Drug use: No  . Sexual activity: Not on file   Other Topics Concern  . Not on file   Social History Narrative   Lives in [place]     No  family history of premature CAD in 1st degree relatives.  Current Meds  Medication Sig  . aspirin EC 81 MG tablet Take 81 mg by mouth daily.    . metoprolol tartrate (LOPRESSOR) 50 MG tablet Take 1 tablet (50 mg total) by mouth 2 (two) times daily.  . ondansetron (ZOFRAN) 8 MG tablet Take 1 tablet (8 mg total) by mouth every 4 (four) hours as needed for nausea or vomiting.  Marland Kitchen oxyCODONE-acetaminophen (PERCOCET/ROXICET) 5-325 MG tablet Take 1 tablet by mouth every 8 (eight) hours as needed for severe pain.      Review of systems complete and found to be negative unless listed above in  HPI    Physical exam Blood pressure 101/69, pulse (!) 136, height 5\' 7"  (1.702 m), weight 118 lb 12.8 oz (53.9 kg), SpO2 96 %. General: Elderly woman, cachectic, no distress. Neck: No JVD, no thyromegaly or thyroid nodule.  Lungs: Diminished throughout, no crackles or wheezes. CV: Tachycardic, regular rhythm, normal S1/S2, no S3/S4, no murmur.  No peripheral edema.  Abdomen: Soft, no distention.  Skin: Intact without lesions or rashes.  Neurologic: Alert and oriented x 3.  Psych: Normal affect. Extremities: No clubbing or cyanosis.  HEENT: Normal.   ECG: Most recent ECG reviewed.   Labs: Lab Results  Component Value Date/Time   K 3.2 (L) 09/22/2016 09:26 AM   BUN 14 09/22/2016 09:26 AM   CREATININE 0.80 09/22/2016 09:26 AM   ALT 14 09/22/2016 09:26 AM   HGB 10.7 (L) 09/22/2016 09:26 AM     Lipids: No results found for: LDLCALC, LDLDIRECT, CHOL, TRIG, HDL      ASSESSMENT AND PLAN:   1. Tachycardia: This appears to be a physiologic sinus tachycardia likely being driven by several factors including dehydration with protein calorie malnutrition, hypercatabolic state due to metastatic cancer with cachexia, and mild anemia. There is no evidence of atrial fibrillation. It appears she has not been taking metoprolol due to an inability to keep pills down without vomiting. If she is able to, can continue metoprolol 50 mg twice daily, but focusing on the treatment of the primary conditions will help to attenuate her heart rate. She likely needs IV fluids as she drinks less than 8 ounces daily. She also needs treatment for hypokalemia. Tachycardia can be controlled with IV metoprolol 5 mg prn during surgery.   Disposition: Follow up prn.  Signed: Kate Sable, M.D., F.A.C.C.  10/10/2016, 3:31 PM

## 2016-10-11 ENCOUNTER — Other Ambulatory Visit: Payer: Self-pay

## 2016-10-11 DIAGNOSIS — E43 Unspecified severe protein-calorie malnutrition: Secondary | ICD-10-CM | POA: Insufficient documentation

## 2016-10-11 DIAGNOSIS — E861 Hypovolemia: Secondary | ICD-10-CM | POA: Diagnosis not present

## 2016-10-11 LAB — URINALYSIS, ROUTINE W REFLEX MICROSCOPIC
BACTERIA UA: NONE SEEN
BILIRUBIN URINE: NEGATIVE
Glucose, UA: NEGATIVE mg/dL
HGB URINE DIPSTICK: NEGATIVE
Ketones, ur: 20 mg/dL — AB
Leukocytes, UA: NEGATIVE
Nitrite: NEGATIVE
PROTEIN: 30 mg/dL — AB
SPECIFIC GRAVITY, URINE: 1.017 (ref 1.005–1.030)
SQUAMOUS EPITHELIAL / LPF: NONE SEEN
pH: 6 (ref 5.0–8.0)

## 2016-10-11 LAB — CBC
HCT: 27.1 % — ABNORMAL LOW (ref 36.0–46.0)
HEMOGLOBIN: 8.7 g/dL — AB (ref 12.0–15.0)
MCH: 27.2 pg (ref 26.0–34.0)
MCHC: 32.1 g/dL (ref 30.0–36.0)
MCV: 84.7 fL (ref 78.0–100.0)
Platelets: 201 10*3/uL (ref 150–400)
RBC: 3.2 MIL/uL — AB (ref 3.87–5.11)
RDW: 15.6 % — ABNORMAL HIGH (ref 11.5–15.5)
WBC: 10.1 10*3/uL (ref 4.0–10.5)

## 2016-10-11 LAB — T4, FREE: FREE T4: 1.03 ng/dL (ref 0.61–1.12)

## 2016-10-11 LAB — TROPONIN I
Troponin I: <0.03 ng/mL (ref <0.03)
Troponin I: <0.03 ng/mL (ref <0.03)

## 2016-10-11 LAB — BASIC METABOLIC PANEL
ANION GAP: 14 (ref 5–15)
BUN: 16 mg/dL (ref 6–20)
CHLORIDE: 100 mmol/L — AB (ref 101–111)
CO2: 23 mmol/L (ref 22–32)
Calcium: 8.4 mg/dL — ABNORMAL LOW (ref 8.9–10.3)
Creatinine, Ser: 0.51 mg/dL (ref 0.44–1.00)
GFR calc non Af Amer: >60 mL/min (ref >60)
Glucose, Bld: 88 mg/dL (ref 65–99)
Potassium: 2.7 mmol/L — CL (ref 3.5–5.1)
SODIUM: 137 mmol/L (ref 135–145)

## 2016-10-11 MED ORDER — POTASSIUM CHLORIDE 10 MEQ/100ML IV SOLN: 10.0000 meq | INTRAVENOUS | Status: AC

## 2016-10-11 MED ORDER — METOPROLOL TARTRATE 25 MG PO TABS
25.0000 mg | ORAL_TABLET | Freq: Two times a day (BID) | ORAL | Status: DC
  Administered 2016-10-11 – 2016-10-12 (×2): 25 mg via ORAL
  Filled 2016-10-11 (×2): qty 1

## 2016-10-11 MED ORDER — SODIUM CHLORIDE 0.9 % IV SOLN
INTRAVENOUS | Status: DC
  Administered 2016-10-11 – 2016-10-13 (×4): via INTRAVENOUS

## 2016-10-11 MED ORDER — POTASSIUM CHLORIDE 10 MEQ/100ML IV SOLN
10.0000 meq | INTRAVENOUS | Status: AC
  Administered 2016-10-11 (×4): 10 meq via INTRAVENOUS
  Filled 2016-10-11 (×4): qty 100

## 2016-10-11 MED ORDER — NITROGLYCERIN 0.4 MG SL SUBL: 0.4000 mg | SUBLINGUAL_TABLET | SUBLINGUAL | Status: DC | PRN

## 2016-10-11 MED ORDER — GUAIFENESIN-DM 100-10 MG/5ML PO SYRP
5.0000 mL | ORAL_SOLUTION | ORAL | Status: DC | PRN
  Administered 2016-10-11: 5 mL via ORAL
  Filled 2016-10-11: qty 5

## 2016-10-11 NOTE — Progress Notes (Signed)
Pt has not voided all day during day shift. RN bladder scanned pt, >500 mL in bladder. Dr. Myna Hidalgo paged and made aware. Waiting for orders/call back. Pt states she doesn't even feel the urge to void

## 2016-10-11 NOTE — Progress Notes (Signed)
Pt recvd 2.5L IVF and had not voided. Bladder scan revealed >239ml of urine. Paged MD and recvd verbal order for in & out cath. Drained 677ml clear yellow urine from pt bladder. Sent specimen to lab per previous order.

## 2016-10-11 NOTE — Progress Notes (Signed)
Pt c/o sudden onset of chest pain/pressure.  Reported to Dr. Thereasa Solo along w/VS.  Order received.

## 2016-10-11 NOTE — Progress Notes (Signed)
While going through admission questions  Pt states she does not wish to have CPR or intubation in the case of an life threatening event. She states she would like to speak to someone about changing her code status accordingly. She states she only wants to be made comfortable. Spiritual Care consult ordered.

## 2016-10-11 NOTE — Progress Notes (Addendum)
[name]  MWN:[id number] DOB: 1945/09/23 DOA: 10/10/2016 PCP: Patient, No Pcp Per    Brief Narrative:  71 y.o. female with a remote history of left breast cancer, more recent history of right breast cancer, and HTN who presented to the ED w/ tachycardia, nausea, and vomiting. Patient was evaluated in the ED 1 month prior when  CTA chest revealed mediastinal masses and vertebral lesions concerning for metastatic disease. This was suspected to be related to a new left breast mass and she was scheduled for a biopsy. However, the surgeon found her to be tachycardic in the 130s and she was sent to the Cardiology clinic for further evaluation. Thre an EKG confirmed a sinus rhythm, but the pt appeared to be markedly hypovolemic in the setting of nausea and vomiting.    Subjective: The pt had a self-limited episode this afternoon of substernal chest pressure.  It resolved after 5-10 minutes on its on.  She denies sob or abdom pain. She tells me that she is feeling better overall.     Assessment & Plan:  Sinus tachycardia - Dehydration  improving w/ volume resuscitation   Intractable N/V  Felt to be due to mediastinal masses displacing esophagus - appears to be able to intermittently tolerate her diet - follow   ?Pancreatitis  Lipase signif elevated but exam not c/w pancreatitis w/ no epigastric discomfort - follow clinically - consider CT abdom when [facility] corrected   Hx of B Breast CA w/ suspected recurrent metastatic Breast CA Left breast cancer early 1990s - right breast cancer 2005 - now appears to have mediastinal and vertebral metastatic disease - to have excisional biopsy per Dr. Arnoldo Morale when stable (outpt)  Acute urinary retention Foley catheter placed 10/11/16   Severe Hypokalemia Due to poor intake - cont to supplement and follow  Normocytic anemia  Suppressed TSH Check FT4 and FT3  Severe malnutrition in context of acute illness/injury  DVT prophylaxis:  lovenox  Code Status: pt confirms to me that she wises to be NCB/DNR Family Communication: no family present at time of exam  Disposition Plan: tele   Consultants:  none  Procedures: None   Antimicrobials:  none  Objective: Blood pressure (!) 99/54, pulse (!) 102, temperature 98.5 F (36.9 C), temperature source Oral, resp. rate 18, height 5\' 7"  (1.702 m), weight 56.3 kg (124 lb 1.9 oz), SpO2 93 %.  Intake/Output Summary (Last 24 hours) at 10/11/16 1500 Last data filed at 10/11/16 0900  Gross per 24 hour  Intake              615 ml  Output             1200 ml  Net             -585 ml   Filed Weights   10/10/16 1645 10/10/16 2254  Weight: 53.5 kg (118 lb) 56.3 kg (124 lb 1.9 oz)    Examination: General: No acute respiratory distress Lungs: Clear to auscultation bilaterally without wheezes or crackles Cardiovascular: Mildly tachycardic without murmur gallop or rub Abdomen: Nontender, nondistended, soft, bowel sounds positive, no rebound, no ascites, no appreciable mass Extremities: No significant cyanosis, clubbing, or edema bilateral lower extremities  CBC:  Recent Labs Lab 10/10/16 1659 40/34/74 2595  WBC DUPLICATE  63.8* 75.6  NEUTROABS 9.3*  --   HGB DUPLICATE  43.3* 8.7*  HCT DUPLICATE  29.5* 18.8*  MCV DUPLICATE  41.6 60.6  PLT DUPLICATE  301  812   Basic Metabolic Panel:  Recent Labs Lab 10/10/16 1659 10/11/16 0805  NA 138 137  K 3.1* 2.7*  CL 92* 100*  CO2 24 23  GLUCOSE 106* 88  BUN 25* 16  CREATININE 0.85 0.51  CALCIUM 9.6 8.4*  MG 1.8  --   PHOS 2.5  --    GFR: Estimated Creatinine Clearance: 56.5 mL/min (by C-G formula based on SCr of 0.51 mg/dL).  Liver Function Tests:  Recent Labs Lab 10/10/16 1659  AST 29  ALT 17  ALKPHOS 213*  BILITOT 1.5*  PROT 8.0  ALBUMIN 3.7    Recent Labs Lab 10/10/16 1659  LIPASE 115*   No results for input(s): AMMONIA in the last 168 hours.  HbA1C: Hgb A1c MFr Bld  Date/Time Value Ref  Range Status  11/26/2010 01:10 AM 6.4 (H) <5.7 % Final    Comment:    (NOTE)                                                                       According to the ADA Clinical Practice Recommendations for 2011, when HbA1c is used as a screening test:  >=6.5%   Diagnostic of Diabetes Mellitus           (if abnormal result is confirmed) 5.7-6.4%   Increased risk of developing Diabetes Mellitus References:Diagnosis and Classification of Diabetes Mellitus,Diabetes XNTZ,0017,49(SWHQP 1):S62-S69 and Standards of Medical Care in         Diabetes - 2011,Diabetes RFFM,3846,65 (Suppl 1):S11-S61.    Scheduled Meds: . enoxaparin (LOVENOX) injection  40 mg Subcutaneous Q24H  . metoprolol tartrate  50 mg Oral BID  . sodium chloride flush  3 mL Intravenous Q12H     LOS: 0 days   Cherene Altes, MD [facility] Office  [phone] Pager - Text Page per Shea Evans as per below:  On-Call/Text Page:      [email or web address]      password [email or web address]  If 7PM-7AM, please contact night-coverage [email or web address] Password [email or web address] 10/11/2016, 3:00 PM

## 2016-10-11 NOTE — Progress Notes (Signed)
Initial Nutrition Assessment  DOCUMENTATION CODES:  Severe malnutrition in context of acute illness/injury, Underweight. Would likely meet criteria in chronic context as well when NFPE can be performed.   INTERVENTION:  Pt has lost nearly 50 lbs, or 28% of her body weight since February. Likely as result of metastatic cancer and insufficient intake.   Per notes, pt has stated "she only wants to be made comfortable. Will defer aggressive supplementation. -Offer Ensure/Boost breeze as pt desires.   NUTRITION DIAGNOSIS:  Inadequate oral intake related to nausea, vomiting as evidenced by per patient/family report.  GOAL:  Patient will meet greater than or equal to 90% of their needs  MONITOR:  PO intake, Supplement acceptance, Labs, Weight trends  REASON FOR ASSESSMENT:  Malnutrition Screening Tool    ASSESSMENT:  71 y/o PMHx L breast cancer originally diagnosed in 2005, HTN.  Approximately 1 month ago was found to have large mediastinal and left hilar masses as well as spinal lesions. Biopsy pending. Presented to ED with Tachycardia, nausea, vomiting.  RD operating remotely. Pt had recently bene seen at the [facility] where she had reported wt loss/poor appetite. She was called by an RD regarding this, but the RD only got voicemail and was unable to leave message.   Pt has stated she has not been able to eat or drink anything due to n/v for the last few days. Unable to take medications. Reports sig wt loss, but unsure of amount. Per chart review, the patient was admitted at weight of 118 lbs. She was weighing 129 lbs just 2.5 weeks ago. Had been weighed at 165 lbs in February. She has lost nearly 50 lbs since February, 28% of her bw.   She meets criteria for severe malnutrition in acute context based off of wt loss of 9 lbs in just 2.5 weeks (>5% bw) and an estimated intake that sounds to have been close to nothing for the past 3 days  In notes, it is seen that pt only  desires to "be made comfortable". Will defer aggressive supplementation at this time.   NFPE would further solidify diagnosis though unable to perform at this time.   Labs: K: 2.7, H/H:8.7/27.1 Meds: IVF   Recent Labs Lab 10/10/16 1659 10/11/16 0805  NA 138 137  K 3.1* 2.7*  CL 92* 100*  CO2 24 23  BUN 25* 16  CREATININE 0.85 0.51  CALCIUM 9.6 8.4*  MG 1.8  --   PHOS 2.5  --   GLUCOSE 106* 88   Diet Order:  Diet regular Room service appropriate? Yes; Fluid consistency: Thin  Skin:  Reviewed, no issues  Last BM:  Incontinent  Height:  Ht Readings from Last 1 Encounters:  10/10/16 5\' 7"  (1.702 m)   Weight:  Wt Readings from Last 1 Encounters:  10/10/16 124 lb 1.9 oz (56.3 kg)   Wt Readings from Last 10 Encounters:  10/10/16 124 lb 1.9 oz (56.3 kg)  10/10/16 118 lb 12.8 oz (53.9 kg)  10/07/16 121 lb (54.9 kg)  09/30/16 126 lb (57.2 kg)  09/22/16 129 lb (58.5 kg)  09/05/16 165 lb (74.8 kg)  03/07/16 164 lb 9.6 oz (74.7 kg)  12/01/10 161 lb 13.1 oz (73.4 kg)  12/16/05 142 lb (64.4 kg)   Ideal Body Weight:  61.36 kg  BMI:  Body mass index using admit weight is 18.48 kg/m.  Estimated Nutritional Needs:  Kcal:  1900-2100 (35-39 kcal/kg bw) Protein:  70-80g Pro (1.3-1.5 g/kg bw) Fluid:  >  1.6 L (30 ml/kg bw)  EDUCATION NEEDS:  Education needs no appropriate at this time  Burtis Junes RD, LDN, [facility] Pager: [phone] 10/11/2016 4:06 PM

## 2016-10-11 NOTE — Progress Notes (Signed)
Dr. McClung at bedside;  

## 2016-10-12 LAB — CBC
HCT: 28.8 % — ABNORMAL LOW (ref 36.0–46.0)
Hemoglobin: 9.2 g/dL — ABNORMAL LOW (ref 12.0–15.0)
MCH: 26.9 pg (ref 26.0–34.0)
MCHC: 31.9 g/dL (ref 30.0–36.0)
MCV: 84.2 fL (ref 78.0–100.0)
Platelets: 193 10*3/uL (ref 150–400)
RBC: 3.42 MIL/uL — ABNORMAL LOW (ref 3.87–5.11)
RDW: 15.6 % — AB (ref 11.5–15.5)
WBC: 9.9 10*3/uL (ref 4.0–10.5)

## 2016-10-12 LAB — MAGNESIUM: MAGNESIUM: 1.5 mg/dL — AB (ref 1.7–2.4)

## 2016-10-12 LAB — COMPREHENSIVE METABOLIC PANEL
ALBUMIN: 2.9 g/dL — AB (ref 3.5–5.0)
ALK PHOS: 199 U/L — AB (ref 38–126)
ALT: 14 U/L (ref 14–54)
ANION GAP: 9 (ref 5–15)
AST: 29 U/L (ref 15–41)
BILIRUBIN TOTAL: 1 mg/dL (ref 0.3–1.2)
BUN: 9 mg/dL (ref 6–20)
CALCIUM: 8.7 mg/dL — AB (ref 8.9–10.3)
CO2: 26 mmol/L (ref 22–32)
Chloride: 104 mmol/L (ref 101–111)
Creatinine, Ser: 0.45 mg/dL (ref 0.44–1.00)
GFR calc Af Amer: >60 mL/min (ref >60)
Glucose, Bld: 101 mg/dL — ABNORMAL HIGH (ref 65–99)
Potassium: 3 mmol/L — ABNORMAL LOW (ref 3.5–5.1)
Sodium: 139 mmol/L (ref 135–145)
TOTAL PROTEIN: 6.2 g/dL — AB (ref 6.5–8.1)

## 2016-10-12 LAB — TROPONIN I: Troponin I: <0.03 ng/mL (ref <0.03)

## 2016-10-12 MED ORDER — MAGNESIUM SULFATE 2 GM/50ML IV SOLN
2.0000 g | Freq: Once | INTRAVENOUS | Status: AC
  Administered 2016-10-12: 2 g via INTRAVENOUS
  Filled 2016-10-12: qty 50

## 2016-10-12 MED ORDER — POTASSIUM CHLORIDE CRYS ER 20 MEQ PO TBCR
40.0000 meq | EXTENDED_RELEASE_TABLET | Freq: Three times a day (TID) | ORAL | Status: DC
  Administered 2016-10-12 (×2): 40 meq via ORAL
  Filled 2016-10-12 (×3): qty 2

## 2016-10-12 MED ORDER — METOPROLOL TARTRATE 25 MG PO TABS
12.5000 mg | ORAL_TABLET | Freq: Two times a day (BID) | ORAL | Status: DC
  Administered 2016-10-12 – 2016-10-16 (×7): 12.5 mg via ORAL
  Filled 2016-10-12 (×8): qty 1

## 2016-10-12 NOTE — Progress Notes (Signed)
Foley removal delegated to another RN on the unit but pt refused at that time.  Foley removed at 1800. Pt has not voided yet. Pt will not be discharged today.

## 2016-10-12 NOTE — Progress Notes (Signed)
[place]  RSW:[id number] DOB: 18-Mar-1945 DOA: 10/10/2016 PCP: Patient, No Pcp Per    Brief Narrative:  71 y.o. female with a remote history of left breast cancer, more recent history of right breast cancer, and HTN who presented to the ED w/ tachycardia, nausea, and vomiting. Patient was evaluated in the ED 1 month prior when CTA chest revealed mediastinal masses and vertebral lesions concerning for metastatic disease. This was suspected to be related to a new left breast mass and she was scheduled for a biopsy. However, the surgeon found her to be tachycardic in the 130s and she was sent to the Cardiology clinic for further evaluation. There an EKG confirmed a sinus rhythm, but the pt appeared to be markedly hypovolemic in the setting of nausea and vomiting.    Subjective: Pt tells me she feels much better, and is strongly desiring that she be d/c home today.  She denies n/v, abdom pain, or chest pain.  She tells me she is eating w/o any difficulty now.    She was noted to have acute urinary retention last evening, which required placement of a foley catheter.  This remains in place at the time of my visit today.    Assessment & Plan:  Sinus tachycardia - Dehydration  Resolved after volume resuscitation   Intractable N/V  Felt to be due to mediastinal masses displacing esophagus - tolerating her diet w/o difficulty at time of d/c - denies any further N/V  ?Pancreatitis  Lipase signif elevated but exam not c/w pancreatitis w/ no epigastric discomfort - given resolution of sx further evaluation not currently required   Hx of B Breast CA w/ suspected recurrent metastatic Breast CA Left breast cancer early 1990s - right breast cancer 2005 - now appears to have mediastinal and vertebral metastatic disease - to have excisional biopsy per Dr. Arnoldo Morale when stable (outpt) - will need extensive body imaging to rule out metastatic deposits elsewhere if this is proven to be CA -  if she remains hospitalized will consider CT abdom/pelvis 9/10  Acute urinary retention Foley catheter placed 10/11/16 - RN to remove foley this afternoon - if unable to urinate without it, the foley will have to be replaced and the pt will need to remain in the hospital - will need to consider MRI spine if her sx persist   Severe Hypokalemia Due to poor intake - not yet at goal but imroving - cont to replace and follow   Hypomagnesemia Replace and follow w/ gol of 2.0   Normocytic anemia No evidence of blood loss - Hgb holding steady   Suppressed TSH FT4 is normal - FT3 pending   Severe malnutrition   DVT prophylaxis: lovenox  Code Status: NCB/DNR Family Communication: no family present at time of exam  Disposition Plan: tele - D/C home later today at pt insistence IF she is able to urinate w/o a foley cath - if she is not, she will have to stay in the hospital for further workup and tx   Consultants:  none  Procedures: None   Antimicrobials:  none  Objective: Blood pressure 100/60, pulse 92, temperature 97.6 F (36.4 C), resp. rate 18, height 5\' 7"  (1.702 m), weight 55.3 kg (121 lb 14.6 oz), SpO2 93 %.  Intake/Output Summary (Last 24 hours) at 10/12/16 1714 Last data filed at 10/12/16 1300  Gross per 24 hour  Intake          1605.83 ml  Output             1050 ml  Net           555.83 ml   Filed Weights   10/10/16 1645 10/10/16 2254 10/12/16 0500  Weight: 53.5 kg (118 lb) 56.3 kg (124 lb 1.9 oz) 55.3 kg (121 lb 14.6 oz)    Examination: General: No acute respiratory distress - alert and oriented x4 Lungs: Clear to auscultation B - no wheezing  Cardiovascular: RRR w/o M or rub  Abdomen: Nontender, nondistended, soft, bowel sounds positive, no rebound Extremities: No edema bilateral lower extremities  CBC:  Recent Labs Lab 10/10/16 1659 10/11/16 0805 17/00/17 4944  WBC DUPLICATE  96.7* 59.1 9.9  NEUTROABS 9.3*  --   --   HGB DUPLICATE  63.8* 8.7* 9.2*    HCT DUPLICATE  46.6* 59.9* 35.7*  MCV DUPLICATE  01.7 79.3 90.3  PLT DUPLICATE  009 233 007   Basic Metabolic Panel:  Recent Labs Lab 10/10/16 1659 10/11/16 0805 10/12/16 0420  NA 138 137 139  K 3.1* 2.7* 3.0*  CL 92* 100* 104  CO2 24 23 26   GLUCOSE 106* 88 101*  BUN 25* 16 9  CREATININE 0.85 0.51 0.45  CALCIUM 9.6 8.4* 8.7*  MG 1.8  --  1.5*  PHOS 2.5  --   --    GFR: Estimated Creatinine Clearance: 55.5 mL/min (by C-G formula based on SCr of 0.45 mg/dL).  Liver Function Tests:  Recent Labs Lab 10/10/16 1659 10/12/16 0420  AST 29 29  ALT 17 14  ALKPHOS 213* 199*  BILITOT 1.5* 1.0  PROT 8.0 6.2*  ALBUMIN 3.7 2.9*    Recent Labs Lab 10/10/16 1659  LIPASE 115*    HbA1C: Hgb A1c MFr Bld  Date/Time Value Ref Range Status  11/26/2010 01:10 AM 6.4 (H) <5.7 % Final    Comment:    (NOTE)                                                                       According to the ADA Clinical Practice Recommendations for 2011, when HbA1c is used as a screening test:  >=6.5%   Diagnostic of Diabetes Mellitus           (if abnormal result is confirmed) 5.7-6.4%   Increased risk of developing Diabetes Mellitus References:Diagnosis and Classification of Diabetes Mellitus,Diabetes MAUQ,3335,45(GYBWL 1):S62-S69 and Standards of Medical Care in         Diabetes - 2011,Diabetes SLHT,3428,76 (Suppl 1):S11-S61.    Scheduled Meds: . enoxaparin (LOVENOX) injection  40 mg Subcutaneous Q24H  . metoprolol tartrate  12.5 mg Oral BID  . potassium chloride  40 mEq Oral TID  . sodium chloride flush  3 mL Intravenous Q12H     LOS: 0 days   Cherene Altes, MD [facility] Office  [phone] Pager - Text Page per Shea Evans as per below:  On-Call/Text Page:      [email or web address]      password [email or web address]  If 7PM-7AM, please contact night-coverage [email or web address] Password [email or web address] 10/12/2016, 5:14 PM

## 2016-10-13 ENCOUNTER — Encounter (HOSPITAL_COMMUNITY): Payer: Self-pay

## 2016-10-13 ENCOUNTER — Observation Stay (HOSPITAL_COMMUNITY): Payer: Medicare HMO

## 2016-10-13 DIAGNOSIS — J449 Chronic obstructive pulmonary disease, unspecified: Secondary | ICD-10-CM | POA: Diagnosis present

## 2016-10-13 DIAGNOSIS — R000 Tachycardia, unspecified: Secondary | ICD-10-CM | POA: Diagnosis present

## 2016-10-13 DIAGNOSIS — D649 Anemia, unspecified: Secondary | ICD-10-CM | POA: Diagnosis present

## 2016-10-13 DIAGNOSIS — Z66 Do not resuscitate: Secondary | ICD-10-CM | POA: Diagnosis present

## 2016-10-13 DIAGNOSIS — E860 Dehydration: Secondary | ICD-10-CM | POA: Diagnosis present

## 2016-10-13 DIAGNOSIS — D500 Iron deficiency anemia secondary to blood loss (chronic): Secondary | ICD-10-CM | POA: Diagnosis not present

## 2016-10-13 DIAGNOSIS — Z885 Allergy status to narcotic agent status: Secondary | ICD-10-CM | POA: Diagnosis not present

## 2016-10-13 DIAGNOSIS — D508 Other iron deficiency anemias: Secondary | ICD-10-CM | POA: Diagnosis not present

## 2016-10-13 DIAGNOSIS — R0602 Shortness of breath: Secondary | ICD-10-CM | POA: Diagnosis not present

## 2016-10-13 DIAGNOSIS — E43 Unspecified severe protein-calorie malnutrition: Secondary | ICD-10-CM | POA: Diagnosis present

## 2016-10-13 DIAGNOSIS — N6320 Unspecified lump in the left breast, unspecified quadrant: Secondary | ICD-10-CM | POA: Diagnosis not present

## 2016-10-13 DIAGNOSIS — I110 Hypertensive heart disease with heart failure: Secondary | ICD-10-CM | POA: Diagnosis present

## 2016-10-13 DIAGNOSIS — E861 Hypovolemia: Secondary | ICD-10-CM | POA: Diagnosis present

## 2016-10-13 DIAGNOSIS — R112 Nausea with vomiting, unspecified: Secondary | ICD-10-CM | POA: Diagnosis present

## 2016-10-13 DIAGNOSIS — Z7982 Long term (current) use of aspirin: Secondary | ICD-10-CM | POA: Diagnosis not present

## 2016-10-13 DIAGNOSIS — Z171 Estrogen receptor negative status [ER-]: Secondary | ICD-10-CM | POA: Diagnosis not present

## 2016-10-13 DIAGNOSIS — C7951 Secondary malignant neoplasm of bone: Secondary | ICD-10-CM | POA: Diagnosis present

## 2016-10-13 DIAGNOSIS — R64 Cachexia: Secondary | ICD-10-CM | POA: Diagnosis present

## 2016-10-13 DIAGNOSIS — E876 Hypokalemia: Secondary | ICD-10-CM | POA: Diagnosis present

## 2016-10-13 DIAGNOSIS — C50919 Malignant neoplasm of unspecified site of unspecified female breast: Secondary | ICD-10-CM | POA: Diagnosis not present

## 2016-10-13 DIAGNOSIS — Z9013 Acquired absence of bilateral breasts and nipples: Secondary | ICD-10-CM | POA: Diagnosis not present

## 2016-10-13 DIAGNOSIS — R339 Retention of urine, unspecified: Secondary | ICD-10-CM | POA: Diagnosis not present

## 2016-10-13 DIAGNOSIS — C50812 Malignant neoplasm of overlapping sites of left female breast: Secondary | ICD-10-CM | POA: Diagnosis present

## 2016-10-13 DIAGNOSIS — Z8249 Family history of ischemic heart disease and other diseases of the circulatory system: Secondary | ICD-10-CM | POA: Diagnosis not present

## 2016-10-13 DIAGNOSIS — Z803 Family history of malignant neoplasm of breast: Secondary | ICD-10-CM | POA: Diagnosis not present

## 2016-10-13 DIAGNOSIS — Z681 Body mass index (BMI) 19 or less, adult: Secondary | ICD-10-CM | POA: Diagnosis not present

## 2016-10-13 DIAGNOSIS — I10 Essential (primary) hypertension: Secondary | ICD-10-CM | POA: Diagnosis not present

## 2016-10-13 DIAGNOSIS — I509 Heart failure, unspecified: Secondary | ICD-10-CM | POA: Diagnosis present

## 2016-10-13 LAB — COMPREHENSIVE METABOLIC PANEL
ALK PHOS: 242 U/L — AB (ref 38–126)
ALT: 13 U/L — AB (ref 14–54)
AST: 31 U/L (ref 15–41)
Albumin: 2.8 g/dL — ABNORMAL LOW (ref 3.5–5.0)
Anion gap: 10 (ref 5–15)
BUN: 6 mg/dL (ref 6–20)
CALCIUM: 8.7 mg/dL — AB (ref 8.9–10.3)
CHLORIDE: 100 mmol/L — AB (ref 101–111)
CO2: 25 mmol/L (ref 22–32)
CREATININE: 0.39 mg/dL — AB (ref 0.44–1.00)
GFR calc non Af Amer: >60 mL/min (ref >60)
Glucose, Bld: 111 mg/dL — ABNORMAL HIGH (ref 65–99)
Potassium: 3 mmol/L — ABNORMAL LOW (ref 3.5–5.1)
SODIUM: 135 mmol/L (ref 135–145)
Total Bilirubin: 0.8 mg/dL (ref 0.3–1.2)
Total Protein: 6.2 g/dL — ABNORMAL LOW (ref 6.5–8.1)

## 2016-10-13 LAB — T3, FREE: T3, Free: 2.6 pg/mL (ref 2.0–4.4)

## 2016-10-13 LAB — PHOSPHORUS: Phosphorus: 1.6 mg/dL — ABNORMAL LOW (ref 2.5–4.6)

## 2016-10-13 LAB — CBC
HCT: 28.1 % — ABNORMAL LOW (ref 36.0–46.0)
Hemoglobin: 9.1 g/dL — ABNORMAL LOW (ref 12.0–15.0)
MCH: 27.2 pg (ref 26.0–34.0)
MCHC: 32.4 g/dL (ref 30.0–36.0)
MCV: 84.1 fL (ref 78.0–100.0)
PLATELETS: 201 10*3/uL (ref 150–400)
RBC: 3.34 MIL/uL — AB (ref 3.87–5.11)
RDW: 15.6 % — AB (ref 11.5–15.5)
WBC: 10.3 10*3/uL (ref 4.0–10.5)

## 2016-10-13 LAB — LIPASE, BLOOD: Lipase: 75 U/L — ABNORMAL HIGH (ref 11–51)

## 2016-10-13 LAB — MAGNESIUM: MAGNESIUM: 1.5 mg/dL — AB (ref 1.7–2.4)

## 2016-10-13 MED ORDER — CHLORHEXIDINE GLUCONATE CLOTH 2 % EX PADS
6.0000 | MEDICATED_PAD | Freq: Once | CUTANEOUS | Status: AC
  Administered 2016-10-13: 6 via TOPICAL

## 2016-10-13 MED ORDER — MAGNESIUM SULFATE 2 GM/50ML IV SOLN
2.0000 g | Freq: Once | INTRAVENOUS | Status: AC
  Administered 2016-10-13: 2 g via INTRAVENOUS
  Filled 2016-10-13: qty 50

## 2016-10-13 MED ORDER — POTASSIUM CHLORIDE CRYS ER 20 MEQ PO TBCR: 60.0000 meq | EXTENDED_RELEASE_TABLET | Freq: Once | ORAL | Status: DC

## 2016-10-13 MED ORDER — TECHNETIUM TO 99M ALBUMIN AGGREGATED
4.0000 | Freq: Once | INTRAVENOUS | Status: AC | PRN
  Administered 2016-10-13: 4 via INTRAVENOUS

## 2016-10-13 MED ORDER — POTASSIUM CHLORIDE CRYS ER 20 MEQ PO TBCR
60.0000 meq | EXTENDED_RELEASE_TABLET | Freq: Three times a day (TID) | ORAL | Status: DC
  Administered 2016-10-13: 60 meq via ORAL
  Filled 2016-10-13: qty 3

## 2016-10-13 MED ORDER — TECHNETIUM TC 99M DIETHYLENETRIAME-PENTAACETIC ACID
30.0000 | Freq: Once | INTRAVENOUS | Status: AC | PRN
  Administered 2016-10-13: 30 via RESPIRATORY_TRACT

## 2016-10-13 MED ORDER — POTASSIUM CHLORIDE 10 MEQ/100ML IV SOLN
10.0000 meq | INTRAVENOUS | Status: AC
  Administered 2016-10-13 – 2016-10-14 (×4): 10 meq via INTRAVENOUS
  Filled 2016-10-13 (×6): qty 100

## 2016-10-13 MED ORDER — CHLORHEXIDINE GLUCONATE CLOTH 2 % EX PADS
6.0000 | MEDICATED_PAD | Freq: Once | CUTANEOUS | Status: AC
  Administered 2016-10-14: 6 via TOPICAL

## 2016-10-13 NOTE — Progress Notes (Signed)
[place]  YBO:[id number] DOB: [birth year] DOA: 10/10/2016 PCP: [name], No Pcp Per    Brief Narrative:  71 y.o. female with a remote history of left breast cancer, more recent history of right breast cancer, and HTN who presented to the ED w/ tachycardia, nausea, and vomiting. [name] was evaluated in the ED 1 month prior when CTA chest revealed mediastinal masses and vertebral lesions concerning for metastatic disease. This was suspected to be related to a new left breast mass and she was scheduled for a biopsy. However, the surgeon found her to be tachycardic in the 130s and she was sent to the Cardiology clinic for further evaluation. There an EKG confirmed a sinus rhythm, but the pt appeared to be markedly hypovolemic in the setting of nausea and vomiting.    Subjective:  [name] was able to void overnight, generally weak," I'm tired of being weak, I would like by surgery to be done while I'm HERE "  Assessment & Plan:  Sinus tachycardia - Dehydration , low threshold to suspect PE  given history of metastatic breast cancer Tachycardic with ambulation VQ scan to rule out PE Troponin negative 3  Intractable N/V  Felt to be due to mediastinal masses displacing esophagus - tolerating her diet w/o difficulty at time  - denies any further N/V  ?Pancreatitis  Lipase signif elevated but exam not c/w pancreatitis w/ no epigastric discomfort - given resolution of sx further evaluation not currently required    Severe hypokalemia hypomagnesemia-replete   Hx of B Breast CA w/ suspected recurrent metastatic Breast CA Left breast cancer early 1990s - right breast cancer 2005 - now appears to have mediastinal and vertebral metastatic disease - to have excisional biopsy per Dr. Arnoldo Morale when stable (outpt) - will need extensive body imaging to rule out metastatic deposits elsewhere if this is proven to be CA - if she remains hospitalized will consider CT abdom/pelvis  9/10 Would like to have the [name] can have her biopsy before leaving   Acute urinary retention Foley catheter placed 10/11/16 - RN to remove foley this afternoon - [name] was able to void, likely combination of  opioid and electrolyte abnormalities   Normocytic anemia No evidence of blood loss - Hgb holding steady   Suppressed TSH T4 and T3 within normal limits Repeat thyroid function in 4-6 weeks   Severe malnutrition   DVT prophylaxis: lovenox  Code Status: NCB/DNR Family Communication: no family present at time of exam  Disposition Plan:   Consultants:  none  Procedures: None   Antimicrobials:  none  Objective: Blood pressure (!) 114/59, pulse (!) 108, temperature 98.7 F (37.1 C), temperature source Oral, resp. rate 18, height 5\' 7"  (1.702 m), weight 55.3 kg (121 lb 14.6 oz), SpO2 98 %.  Intake/Output Summary (Last 24 hours) at 10/13/16 0921 Last data filed at 10/13/16 0536  Gross per 24 hour  Intake          1901.67 ml  Output              600 ml  Net          1301.67 ml   Filed Weights   10/10/16 1645 10/10/16 2254 10/12/16 0500  Weight: 53.5 kg (118 lb) 56.3 kg (124 lb 1.9 oz) 55.3 kg (121 lb 14.6 oz)    Examination: General: No acute respiratory distress - alert and oriented x4 Lungs: Clear to auscultation B - no wheezing  Cardiovascular: RRR w/o M  or rub  Abdomen: Nontender, nondistended, soft, bowel sounds positive, no rebound Extremities: No edema bilateral lower extremities  CBC:  Recent Labs Lab 10/10/16 1659 10/11/16 0805 10/12/16 0420 57/90/38 3338  WBC DUPLICATE  32.9* 19.1 9.9 10.3  NEUTROABS 9.3*  --   --   --   HGB DUPLICATE  66.0* 8.7* 9.2* 9.1*  HCT DUPLICATE  60.0* 45.9* 97.7* 41.4*  MCV DUPLICATE  23.9 53.2 02.3 34.3  PLT DUPLICATE  [phone] 290   Basic Metabolic Panel:  Recent Labs Lab 10/10/16 1659 10/11/16 0805 10/12/16 0420 10/13/16 0624  NA 138 137 139 135  K 3.1* 2.7* 3.0* 3.0*  CL 92* 100* 104 100*   CO2 24 23 26 25   GLUCOSE 106* 88 101* 111*  BUN 25* 16 9 6   CREATININE 0.85 0.51 0.45 0.39*  CALCIUM 9.6 8.4* 8.7* 8.7*  MG 1.8  --  1.5* 1.5*  PHOS 2.5  --   --  1.6*   GFR: Estimated Creatinine Clearance: 55.5 mL/min (A) (by C-G formula based on SCr of 0.39 mg/dL (L)).  Liver Function Tests:  Recent Labs Lab 10/10/16 1659 10/12/16 0420 10/13/16 0624  AST 29 29 31   ALT 17 14 13*  ALKPHOS 213* 199* 242*  BILITOT 1.5* 1.0 0.8  PROT 8.0 6.2* 6.2*  ALBUMIN 3.7 2.9* 2.8*    Recent Labs Lab 10/10/16 1659 10/13/16 0624  LIPASE 115* 75*    HbA1C: Hgb A1c MFr Bld  Date/Time Value Ref Range Status  11/26/2010 01:10 AM 6.4 (H) <5.7 % Final    Comment:    (NOTE)                                                                       According to the ADA Clinical Practice Recommendations for 2011, when HbA1c is used as a screening test:  >=6.5%   Diagnostic of Diabetes Mellitus           (if abnormal result is confirmed) 5.7-6.4%   Increased risk of developing Diabetes Mellitus References:Diagnosis and Classification of Diabetes Mellitus,Diabetes SXJD,5520,80(EMVVK 1):S62-S69 and Standards of Medical Care in         Diabetes - 2011,Diabetes PQAE,4975,30 (Suppl 1):S11-S61.    Scheduled Meds: . enoxaparin (LOVENOX) injection  40 mg Subcutaneous Q24H  . metoprolol tartrate  12.5 mg Oral BID  . potassium chloride  60 mEq Oral TID  . sodium chloride flush  3 mL Intravenous Q12H     LOS: 0 days     On-Call/Text Page:      [email or web address]      password [email or web address]  If 7PM-7AM, please contact night-coverage [email or web address] Password [email or web address] 10/13/2016, 9:21 AM

## 2016-10-13 NOTE — Evaluation (Signed)
Physical Therapy Evaluation Patient Details Name: [name] MRN: [id number] DOB: Jun 29, 1945 Today's Date: 10/13/2016   History of Present Illness  [name] is a 71 y.o. female with medical history significant for remote history of left breast cancer, more recent history of right breast cancer, and hypertension, now presenting to the emergency department for evaluation of tachycardia, nausea, and vomiting. Patient was evaluated in the emergency department in 1 month ago with a cough, noted to be tachycardic, and underwent CTA chest that was negative for PE, but notable for mediastinal masses and vertebral lesions concerning for metastatic disease. This was suspected to be related to a new left breast mass and she was scheduled for biopsy of this. The surgeon found her to be tachycardic in the 130s and she was sent to the cardiology clinic for further evaluation. EKG confirms a sinus rhythm, and the tachycardia is felt to be secondary to her marked hypovolemia in the setting of nausea and vomiting, with likely contributions from her hypermetabolic state secondary to cancer, poor nutritional status, and her anemia. Patient has had ongoing nausea with vomiting for weeks, worsening. Only mild abdominal discomfort, but no pain. No melena or hematochezia. No fevers or chills.    Clinical Impression  Patient demonstrates slow labored movement for sit to stands, and gait training secondary to generalized weakness and fatigue.  Patient will benefit from continued physical therapy in hospital and recommended venue below.    Follow Up Recommendations Home health PT;Supervision - Intermittent    Equipment Recommendations  None recommended by PT    Recommendations for Other Services       Precautions / Restrictions Precautions Precautions: Fall Precaution Comments: Based on Nursing fall assessment score Restrictions Weight Bearing Restrictions: No      Mobility  Bed Mobility Overal bed  mobility: Needs Assistance Bed Mobility: Supine to Sit;Sit to Supine     Supine to sit: Min guard Sit to supine: Supervision      Transfers Overall transfer level: Needs assistance Equipment used: Rolling walker (2 wheeled) Transfers: Sit to/from [facility] Sit to Stand: Min guard Stand pivot transfers: Min guard          Ambulation/Gait Ambulation/Gait assistance: Min guard Ambulation Distance (Feet): 45 Feet Assistive device: Rolling walker (2 wheeled) Gait Pattern/deviations: Decreased step length - right;Decreased step length - left;Decreased stride length   Gait velocity interpretation: Below normal speed for age/gender General Gait Details: slightly labored slow cadence, limited secondary to c/o fatigue  Stairs            Wheelchair Mobility    Modified Rankin (Stroke Patients Only)       Balance Overall balance assessment: Needs assistance Sitting-balance support: Feet supported Sitting balance-Leahy Scale: Good     Standing balance support: Bilateral upper extremity supported;During functional activity Standing balance-Leahy Scale: Fair                               Pertinent Vitals/Pain Pain Assessment: No/denies pain    Home Living Family/patient expects to be discharged to:: Private residence Living Arrangements: Alone Available Help at Discharge: Neighbor;Available PRN/intermittently Type of Home: House Home Access: Level entry     Home Layout: One level Home Equipment: Walker - 2 wheels Additional Comments: Patient's RW in her room    Prior Function Level of Independence: Independent with assistive device(s)  Hand Dominance   Dominant Hand: Right    Extremity/Trunk Assessment   Upper Extremity Assessment Upper Extremity Assessment: Generalized weakness    Lower Extremity Assessment Lower Extremity Assessment: Generalized weakness    Cervical / Trunk Assessment Cervical /  Trunk Assessment: Normal  Communication   Communication: No difficulties  Cognition Arousal/Alertness: Awake/alert Behavior During Therapy: WFL for tasks assessed/performed Overall Cognitive Status: Within Functional Limits for tasks assessed                                        General Comments      Exercises     Assessment/Plan    PT Assessment Patient needs continued PT services  PT Problem List Decreased strength;Decreased activity tolerance;Decreased balance;Decreased mobility       PT Treatment Interventions Gait training;Functional mobility training;Therapeutic activities;Therapeutic exercise;Patient/family education;Balance training    PT Goals (Current goals can be found in the Care Plan section)  Acute Rehab PT Goals Patient Stated Goal: Return home with neighbors to assist PT Goal Formulation: With patient Time For Goal Achievement: 10/17/16 Potential to Achieve Goals: Good    Frequency Min 3X/week   Barriers to discharge        Co-evaluation               AM-PAC PT "6 Clicks" Daily Activity  Outcome Measure Difficulty turning over in bed (including adjusting bedclothes, sheets and blankets)?: None Difficulty moving from lying on back to sitting on the side of the bed? : A Little Difficulty sitting down on and standing up from a chair with arms (e.g., wheelchair, bedside commode, etc,.)?: A Little Help needed moving to and from a bed to chair (including a wheelchair)?: A Little Help needed walking in hospital room?: A Little Help needed climbing 3-5 steps with a railing? : A Lot 6 Click Score: 18    End of Session Equipment Utilized During Treatment: Gait belt   Patient left: in chair;with call bell/phone within reach Nurse Communication: Mobility status PT Visit Diagnosis: Unsteadiness on feet (R26.81);Other abnormalities of gait and mobility (R26.89);Muscle weakness (generalized) (M62.81)    Time: 3716-9678 PT Time  Calculation (min) (ACUTE ONLY): 32 min   Charges:   PT Evaluation $PT Eval Low Complexity: 1 Low PT Treatments $Therapeutic Activity: 23-37 mins   PT G Codes:   PT G-Codes **NOT FOR INPATIENT CLASS** Functional Assessment Tool Used: AM-PAC 6 Clicks Basic Mobility Functional Limitation: Mobility: Walking and moving around Mobility: Walking and Moving Around Current Status (L3810): At least 40 percent but less than 60 percent impaired, limited or restricted Mobility: Walking and Moving Around Goal Status [phone]): At least 40 percent but less than 60 percent impaired, limited or restricted Mobility: Walking and Moving Around Discharge Status [phone]): At least 40 percent but less than 60 percent impaired, limited or restricted    1:15 PM, 10/13/16 Lonell Grandchild, MPT Physical Therapist with [facility] 336 [phone] office [phone] mobile phone

## 2016-10-13 NOTE — Care Management Note (Signed)
Case Management Note  Patient Details  Name: [name] MRN: [id number] Date of Birth: [birth year]  Subjective/Objective:                 Admitted with hypovolemia. Pt from home, lives alone. She is believed to have recurrent breast Ca, planning to undergo biopsy Wednesday and chemo in the near future. Pt uses RW with ambulation. Pt has no PCP and does not want to pursue finding one due to have too much else going on, too many other appointments. PT has recommended HH PT. Pt agreeable, no preference of agency.     Action/Plan: Referral sent to [facility]. Rep, Delsa Bern, has offered to have 'visiting MD' through [facility] see pt while services are provided in home. Pt agreeable to this. DC anticipated Wed/Thursday. CM will cont to follow.    Expected Discharge Date:     10/14/2016             Expected Discharge Plan:  [facility]  In-House Referral:  NA  Discharge planning Services  CM Consult  Post Acute Care Choice:  Home Health Choice offered to:  Patient  HH Arranged:  PT [place] Agency:  [facility] (formerly [facility])  Status of Service:  In process, will continue to follow  Sherald Barge, RN 10/13/2016, 2:12 PM

## 2016-10-13 NOTE — ACP (Advance Care Planning) (Signed)
Shared Advance Directives information with Ms. Donlan. Will follow up with her tomorrow about completion/questions.

## 2016-10-13 NOTE — Care Management Obs Status (Signed)
[place] NOTIFICATION   Patient Details  Name: [name] MRN: [id number] Date of Birth: 1946/01/16   Medicare Observation Status Notification Given:  Yes    Sherald Barge, RN 10/13/2016, 2:12 PM

## 2016-10-13 NOTE — Evaluation (Signed)
Occupational Therapy Evaluation Patient Details Name: EXA BOMBA MRN: [id number] DOB: [birth year] Today's Date: 10/13/2016    History of Present Illness LAKYA SCHRUPP is a 71 y.o. female with medical history significant for remote history of left breast cancer, more recent history of right breast cancer, and hypertension, now presenting to the emergency department for evaluation of tachycardia, nausea, and vomiting.   Clinical Impression   Pt sitting up in chair upon therapy arrival requesting to return to bed upon therapy arrival. Patient reports that she lives alone although she has a good neighbor that will come assist her if needed. She is functioning at modified independent level needing some increased time to complete tasks such as sit to stands with RW. Although, patient has some weakness in BUE shoulders I do not see the need for any follow OT services at this time. Strength in shoulders should return upon discharge home as patient returns to normal daily routines. All OT needs met at this time.    Follow Up Recommendations  No OT follow up    Equipment Recommendations  None recommended by OT       Precautions / Restrictions Precautions Precautions: Fall Precaution Comments: Based on Nursing fall assessment score Restrictions Weight Bearing Restrictions: No      Mobility Bed Mobility Overal bed mobility: Modified Independent                Transfers Overall transfer level: Modified independent Equipment used: Rolling walker (2 wheeled)                      ADL either performed or assessed with clinical judgement   ADL Overall ADL's : Modified independent;At baseline             Vision Baseline Vision/History: No visual deficits Patient Visual Report: No change from baseline              Pertinent Vitals/Pain Pain Assessment: No/denies pain     Hand Dominance Right   Extremity/Trunk Assessment Upper Extremity Assessment Upper  Extremity Assessment:  (Patient with 4-/5 strength for shoulder flexion and abduction. WFL strength for all other shoulder ranges. )   Lower Extremity Assessment Lower Extremity Assessment: Defer to PT evaluation       Communication Communication Communication: No difficulties   Cognition Arousal/Alertness: Awake/alert Behavior During Therapy: WFL for tasks assessed/performed Overall Cognitive Status: Within Functional Limits for tasks assessed                     Home Living Family/patient expects to be discharged to:: Private residence Living Arrangements: Alone Available Help at Discharge: Neighbor;Available PRN/intermittently                         Home Equipment: Walker - 2 wheels          Prior Functioning/Environment Level of Independence: Independent with assistive device(s)                       AM-PAC PT "6 Clicks" Daily Activity     Outcome Measure Help from another person eating meals?: None Help from another person taking care of personal grooming?: None Help from another person toileting, which includes using toliet, bedpan, or urinal?: None Help from another person bathing (including washing, rinsing, drying)?: None Help from another person to put on and taking off regular upper body clothing?: None Help from another person to put  on and taking off regular lower body clothing?: None 6 Click Score: 24   End of Session Equipment Utilized During Treatment: Rolling walker  Activity Tolerance: Patient tolerated treatment well Patient left: in bed;with call bell/phone within reach                   Time: 0920-0936 OT Time Calculation (min): 16 min Charges:  OT General Charges $OT Visit: 1 Visit OT Evaluation $OT Eval Low Complexity: 1 Low G-Codes: OT G-codes **NOT FOR INPATIENT CLASS** Functional Assessment Tool Used: AM-PAC 6 Clicks Daily Activity Functional Limitation: Self care Self Care Current Status (O3729): 0 percent  impaired, limited or restricted Self Care Goal Status (M2111): 0 percent impaired, limited or restricted Self Care Discharge Status (B5208): 0 percent impaired, limited or restricted   Ailene Ravel, OTR/L,CBIS  [phone]   Margueritte Guthridge, Clarene Duke 10/13/2016, 9:40 AM

## 2016-10-13 NOTE — Progress Notes (Signed)
Pt refusing biopsy and stating she will not sign a consent. She states, "Just cut the thing out. I don't want it.". MD paged and made aware.

## 2016-10-13 NOTE — Consult Note (Signed)
I am aware of patient's admission. Patient was seen in my office last week. She is currently undergoing CT scan of chest. We will schedule her for left chest wall skin biopsy on 10/15/2016. Hypokalemia needs to be addressed.

## 2016-10-14 ENCOUNTER — Ambulatory Visit (HOSPITAL_COMMUNITY): Payer: Medicare HMO

## 2016-10-14 LAB — COMPREHENSIVE METABOLIC PANEL
ALK PHOS: 247 U/L — AB (ref 38–126)
ALT: 12 U/L — AB (ref 14–54)
AST: 29 U/L (ref 15–41)
Albumin: 2.5 g/dL — ABNORMAL LOW (ref 3.5–5.0)
Anion gap: 9 (ref 5–15)
BILIRUBIN TOTAL: 1 mg/dL (ref 0.3–1.2)
BUN: 5 mg/dL — ABNORMAL LOW (ref 6–20)
CALCIUM: 8.5 mg/dL — AB (ref 8.9–10.3)
CO2: 27 mmol/L (ref 22–32)
CREATININE: 0.36 mg/dL — AB (ref 0.44–1.00)
Chloride: 99 mmol/L — ABNORMAL LOW (ref 101–111)
Glucose, Bld: 94 mg/dL (ref 65–99)
Potassium: 3.4 mmol/L — ABNORMAL LOW (ref 3.5–5.1)
Sodium: 135 mmol/L (ref 135–145)
Total Protein: 5.9 g/dL — ABNORMAL LOW (ref 6.5–8.1)

## 2016-10-14 LAB — MAGNESIUM: MAGNESIUM: 1.4 mg/dL — AB (ref 1.7–2.4)

## 2016-10-14 LAB — CBC
HCT: 27.3 % — ABNORMAL LOW (ref 36.0–46.0)
Hemoglobin: 8.7 g/dL — ABNORMAL LOW (ref 12.0–15.0)
MCH: 26.9 pg (ref 26.0–34.0)
MCHC: 31.9 g/dL (ref 30.0–36.0)
MCV: 84.3 fL (ref 78.0–100.0)
PLATELETS: 195 10*3/uL (ref 150–400)
RBC: 3.24 MIL/uL — ABNORMAL LOW (ref 3.87–5.11)
RDW: 15.9 % — AB (ref 11.5–15.5)
WBC: 10.2 10*3/uL (ref 4.0–10.5)

## 2016-10-14 LAB — ABO/RH: ABO/RH(D): B POS

## 2016-10-14 LAB — PREPARE RBC (CROSSMATCH)

## 2016-10-14 MED ORDER — SODIUM CHLORIDE 0.9 % IV SOLN: Freq: Once | INTRAVENOUS | Status: DC

## 2016-10-14 MED ORDER — CEFAZOLIN SODIUM-DEXTROSE 2-4 GM/100ML-% IV SOLN
2.0000 g | INTRAVENOUS | Status: AC
  Administered 2016-10-15: 2 g via INTRAVENOUS
  Filled 2016-10-14 (×2): qty 100

## 2016-10-14 MED ORDER — CHLORHEXIDINE GLUCONATE CLOTH 2 % EX PADS
6.0000 | MEDICATED_PAD | Freq: Once | CUTANEOUS | Status: AC
  Administered 2016-10-15: 6 via TOPICAL

## 2016-10-14 MED ORDER — CHLORHEXIDINE GLUCONATE CLOTH 2 % EX PADS
6.0000 | MEDICATED_PAD | Freq: Once | CUTANEOUS | Status: AC
  Administered 2016-10-14: 6 via TOPICAL

## 2016-10-14 MED ORDER — POTASSIUM CHLORIDE 20 MEQ PO PACK
40.0000 meq | PACK | Freq: Two times a day (BID) | ORAL | Status: DC
  Administered 2016-10-14 (×2): 40 meq via ORAL
  Filled 2016-10-14 (×2): qty 2

## 2016-10-14 MED ORDER — POTASSIUM CHLORIDE 10 MEQ/100ML IV SOLN: 10.0000 meq | INTRAVENOUS | Status: DC

## 2016-10-14 MED ORDER — MAGNESIUM SULFATE 50 % IJ SOLN
3.0000 g | Freq: Once | INTRAVENOUS | Status: AC
  Administered 2016-10-14: 3 g via INTRAVENOUS
  Filled 2016-10-14: qty 6

## 2016-10-14 NOTE — Progress Notes (Signed)
Physical Therapy Treatment Patient Details Name: [name] MRN: [id number] DOB: 1946/01/14 Today's Date: 10/14/2016    History of Present Illness [name] is a 71 y.o. female with medical history significant for remote history of left breast cancer, more recent history of right breast cancer, and hypertension, now presenting to the emergency department for evaluation of tachycardia, nausea, and vomiting. Patient was evaluated in the emergency department in 1 month ago with a cough, noted to be tachycardic, and underwent CTA chest that was negative for PE, but notable for mediastinal masses and vertebral lesions concerning for metastatic disease. This was suspected to be related to a new left breast mass and she was scheduled for biopsy of this. The surgeon found her to be tachycardic in the 130s and she was sent to the cardiology clinic for further evaluation. EKG confirms a sinus rhythm, and the tachycardia is felt to be secondary to her marked hypovolemia in the setting of nausea and vomiting, with likely contributions from her hypermetabolic state secondary to cancer, poor nutritional status, and her anemia. Patient has had ongoing nausea with vomiting for weeks, worsening. Only mild abdominal discomfort, but no pain. No melena or hematochezia. No fevers or chills.    PT Comments    Pt is up to walk with PT and noted fatigue with the greater effort today.  Had pt positioned for spine protection in the chair and she was comfortably able to sit.  Had conversation with her about safety and plans following up.  Her visitor is a friend and pt was discussing her plans with them as well.  Will continue acute therapy and progress her gait and balance as tolerated.   Follow Up Recommendations  Home health PT;Supervision - Intermittent     Equipment Recommendations  None recommended by PT    Recommendations for Other Services       Precautions / Restrictions Precautions Precautions:  Fall Precaution Comments: spine/back precautions Restrictions Weight Bearing Restrictions: No Other Position/Activity Restrictions: spine supported with pillow to neutral posture due to suspected spine mets    Mobility  Bed Mobility Overal bed mobility: Needs Assistance Bed Mobility: Supine to Sit     Supine to sit: Min guard     General bed mobility comments: reminders for safety and touse good hand placement  Transfers Overall transfer level: Needs assistance Equipment used: Rolling walker (2 wheeled) Transfers: Sit to/from [facility] Sit to Stand: Min guard Stand pivot transfers: Min guard       General transfer comment: reminders for safe hand placement transitioning to stand  Ambulation/Gait Ambulation/Gait assistance: Min guard Ambulation Distance (Feet): 95 Feet Assistive device: Rolling walker (2 wheeled) Gait Pattern/deviations: Step-to pattern;Step-through pattern;Shuffle;Decreased stride length;Trunk flexed;Narrow base of support Gait velocity: reduced Gait velocity interpretation: Below normal speed for age/gender General Gait Details: gradually decreased clearance of feet with effort as it went on   Stairs            Wheelchair Mobility    Modified Rankin (Stroke Patients Only)       Balance Overall balance assessment: Needs assistance Sitting-balance support: Feet supported Sitting balance-Leahy Scale: Good     Standing balance support: Bilateral upper extremity supported;During functional activity Standing balance-Leahy Scale: Fair Standing balance comment: less than fair dynamically                            Cognition Arousal/Alertness: Awake/alert Behavior During Therapy: WFL for tasks  assessed/performed Overall Cognitive Status: Within Functional Limits for tasks assessed                                        Exercises      General Comments General comments (skin integrity, edema,  etc.): pt up in chair with legs elevated, trunk supported due to susp spinal mets      Pertinent Vitals/Pain Pain Assessment: No/denies pain    Home Living                      Prior Function            PT Goals (current goals can now be found in the care plan section) Acute Rehab PT Goals Patient Stated Goal: Return home with neighbors to assist PT Goal Formulation: With patient Progress towards PT goals: Progressing toward goals    Frequency    Min 3X/week      PT Plan Current plan remains appropriate    Co-evaluation              AM-PAC PT "6 Clicks" Daily Activity  Outcome Measure  Difficulty turning over in bed (including adjusting bedclothes, sheets and blankets)?: None Difficulty moving from lying on back to sitting on the side of the bed? : A Little Difficulty sitting down on and standing up from a chair with arms (e.g., wheelchair, bedside commode, etc,.)?: A Little Help needed moving to and from a bed to chair (including a wheelchair)?: A Little Help needed walking in hospital room?: A Little Help needed climbing 3-5 steps with a railing? : A Lot 6 Click Score: 18    End of Session Equipment Utilized During Treatment: Gait belt Activity Tolerance: Patient limited by fatigue Patient left: in chair;with call bell/phone within reach;with nursing/sitter in room;with family/visitor present Nurse Communication: Mobility status PT Visit Diagnosis: Unsteadiness on feet (R26.81);Other abnormalities of gait and mobility (R26.89);Muscle weakness (generalized) (M62.81)     Time: 8563-1497 PT Time Calculation (min) (ACUTE ONLY): 24 min  Charges:  $Gait Training: 8-22 mins $Therapeutic Exercise: 8-22 mins                    G Codes:  Functional Assessment Tool Used: AM-PAC 6 Clicks Basic Mobility     Ramond Dial 10/14/2016, 2:05 PM   2:08 PM, 10/14/16 Mee Hives, PT, MS Physical Therapist - [place] [phone]  (Office)

## 2016-10-14 NOTE — Progress Notes (Signed)
Patient understands now the procedure being performed on 10/15/2016. The risks and benefits the procedure were fully explained to the patient, who gave informed consent.

## 2016-10-14 NOTE — Progress Notes (Signed)
Dr. Arnoldo Morale paged and made aware that pt is refusing to sign consent for her biopsy tomorrow because she "wants the whole thing taken out." Waiting for response.

## 2016-10-14 NOTE — Progress Notes (Signed)
[place]  QJJ:[id number] DOB: 07/29/1945 DOA: 10/10/2016 PCP: Patient, No Pcp Per    Brief Narrative:  71 y.o. female with a remote history of left breast cancer, more recent history of right breast cancer, and HTN who presented to the ED w/ tachycardia, nausea, and vomiting. Patient was evaluated in the ED 1 month prior when CTA chest revealed mediastinal masses and vertebral lesions concerning for metastatic disease. This was suspected to be related to a new left breast mass and she was scheduled for a biopsy. However, the surgeon found her to be tachycardic in the 130s and she was sent to the Cardiology clinic for further evaluation. There an EKG confirmed a sinus rhythm, but the pt appeared to be markedly hypovolemic in the setting of nausea and vomiting.    Subjective:  Weakness has improved however patient continues to request surgery on her right breast, occasional nonsustained VT on telemetry, slight tachycardia noted  Assessment & Plan:  Sinus tachycardia - Dehydration , low threshold to suspect PE  given history of metastatic breast cancer Tachycardic with ambulation VQ scan to rule out PE, negative Troponin negative 3  Intractable N/V  Felt to be due to mediastinal masses displacing esophagus - tolerating her diet w/o difficulty at time  - denies any further N/V  ?Pancreatitis  Lipase signif elevated but exam not c/w pancreatitis w/ no epigastric discomfort - given resolution of sx further evaluation not currently required    Severe hypokalemia hypomagnesemia-replete orally at the patient probably needs supplementation at home also, we'll have to see if she is able to tolerate  oral repletion in the hospital   Hx of B Breast CA w/ suspected recurrent metastatic Breast CA Left breast cancer early 1990s - right breast cancer 2005 - now appears to have mediastinal and vertebral metastatic disease - to have excisional biopsy per Dr. Arnoldo Morale when stable  (outpt) - will need extensive body imaging to rule out metastatic deposits elsewhere if this is proven to be CA - Dr. Arnoldo Morale has scheduled patient for biopsy on 9/12   Acute urinary retention, resolved Foley catheter placed 10/11/16 - RN   remove foley 9/9 and the patient has been able to void- patient was able to void, likely combination of  opioid and electrolyte abnormalities   Normocytic anemia No evidence of blood loss - Hgb holding steady   Suppressed TSH T4 and T3 within normal limits Repeat thyroid function in 4-6 weeks   Severe malnutrition   DVT prophylaxis: lovenox  Code Status: NCB/DNR Family Communication: no family present at time of exam  Disposition Plan:  Anticipate biopsy procedure tomorrow  Consultants:  Surgery  Procedures: None   Antimicrobials:  none  Objective: Blood pressure 116/66, pulse (!) 114, temperature 98.4 F (36.9 C), temperature source Oral, resp. rate 19, height 5\' 7"  (1.702 m), weight 61.1 kg (134 lb 11.2 oz), SpO2 94 %.  Intake/Output Summary (Last 24 hours) at 10/14/16 1032 Last data filed at 10/14/16 1024  Gross per 24 hour  Intake          2829.17 ml  Output              100 ml  Net          2729.17 ml   Filed Weights   10/12/16 0500 10/14/16 0500 10/14/16 0639  Weight: 55.3 kg (121 lb 14.6 oz) 58.2 kg (128 lb 4.8 oz) 61.1 kg (134 lb 11.2 oz)  Examination: General: No acute respiratory distress - alert and oriented x4 Lungs: Clear to auscultation B - no wheezing  Cardiovascular: RRR w/o M or rub  Abdomen: Nontender, nondistended, soft, bowel sounds positive, no rebound Extremities: No edema bilateral lower extremities  CBC:  Recent Labs Lab 10/10/16 1659 10/11/16 0805 10/12/16 0420 10/13/16 0624 33/35/45 6256  WBC DUPLICATE  38.9* 37.3 9.9 10.3 10.2  NEUTROABS 9.3*  --   --   --   --   HGB DUPLICATE  42.8* 8.7* 9.2* 9.1* 8.7*  HCT DUPLICATE  76.8* 11.5* 72.6* 20.3* 55.9*  MCV DUPLICATE  74.1 63.8 45.3 64.6  80.3  PLT DUPLICATE  212 248 250 037 048   Basic Metabolic Panel:  Recent Labs Lab 10/10/16 1659 10/11/16 0805 10/12/16 0420 10/13/16 0624 10/14/16 0439  NA 138 137 139 135 135  K 3.1* 2.7* 3.0* 3.0* 3.4*  CL 92* 100* 104 100* 99*  CO2 24 23 26 25 27   GLUCOSE 106* 88 101* 111* 94  BUN 25* 16 9 6  5*  CREATININE 0.85 0.51 0.45 0.39* 0.36*  CALCIUM 9.6 8.4* 8.7* 8.7* 8.5*  MG 1.8  --  1.5* 1.5* 1.4*  PHOS 2.5  --   --  1.6*  --    GFR: Estimated Creatinine Clearance: 61.3 mL/min (A) (by C-G formula based on SCr of 0.36 mg/dL (L)).  Liver Function Tests:  Recent Labs Lab 10/10/16 1659 10/12/16 0420 10/13/16 0624 10/14/16 0439  AST 29 29 31 29   ALT 17 14 13* 12*  ALKPHOS 213* 199* 242* 247*  BILITOT 1.5* 1.0 0.8 1.0  PROT 8.0 6.2* 6.2* 5.9*  ALBUMIN 3.7 2.9* 2.8* 2.5*    Recent Labs Lab 10/10/16 1659 10/13/16 0624  LIPASE 115* 75*    HbA1C: Hgb A1c MFr Bld  Date/Time Value Ref Range Status  11/26/2010 01:10 AM 6.4 (H) <5.7 % Final    Comment:    (NOTE)                                                                       According to the ADA Clinical Practice Recommendations for 2011, when HbA1c is used as a screening test:  >=6.5%   Diagnostic of Diabetes Mellitus           (if abnormal result is confirmed) 5.7-6.4%   Increased risk of developing Diabetes Mellitus References:Diagnosis and Classification of Diabetes Mellitus,Diabetes GQBV,6945,03(UUEKC 1):S62-S69 and Standards of Medical Care in         Diabetes - 2011,Diabetes Care,2011,34 (Suppl 1):S11-S61.    Scheduled Meds: . enoxaparin (LOVENOX) injection  40 mg Subcutaneous Q24H  . metoprolol tartrate  12.5 mg Oral BID  . potassium chloride  40 mEq Oral BID  . sodium chloride flush  3 mL Intravenous Q12H     LOS: 1 day     On-Call/Text Page:      [email or web address]      password [email or web address]  If 7PM-7AM, please contact night-coverage [email or web address] Password [facility] 10/14/2016, 10:32 AM

## 2016-10-15 ENCOUNTER — Inpatient Hospital Stay (HOSPITAL_COMMUNITY): Payer: Medicare HMO | Admitting: Certified Registered Nurse Anesthetist

## 2016-10-15 ENCOUNTER — Encounter (HOSPITAL_COMMUNITY)
Admission: EM | Disposition: A | Discharge status: Home / Self Care | Payer: Self-pay | Source: Home / Self Care | Attending: Internal Medicine

## 2016-10-15 ENCOUNTER — Encounter (HOSPITAL_COMMUNITY): Payer: Self-pay | Admitting: Certified Registered Nurse Anesthetist

## 2016-10-15 HISTORY — PX: SKIN BIOPSY: SHX1

## 2016-10-15 LAB — BASIC METABOLIC PANEL
ANION GAP: 12 (ref 5–15)
CHLORIDE: 95 mmol/L — AB (ref 101–111)
CO2: 27 mmol/L (ref 22–32)
Calcium: 8.7 mg/dL — ABNORMAL LOW (ref 8.9–10.3)
Creatinine, Ser: 0.41 mg/dL — ABNORMAL LOW (ref 0.44–1.00)
GFR calc Af Amer: >60 mL/min (ref >60)
GLUCOSE: 107 mg/dL — AB (ref 65–99)
POTASSIUM: 3 mmol/L — AB (ref 3.5–5.1)
Sodium: 134 mmol/L — ABNORMAL LOW (ref 135–145)

## 2016-10-15 LAB — SURGICAL PCR SCREEN
MRSA, PCR: NEGATIVE
Staphylococcus aureus: NEGATIVE

## 2016-10-15 LAB — MAGNESIUM: MAGNESIUM: 1.5 mg/dL — AB (ref 1.7–2.4)

## 2016-10-15 SURGERY — BIOPSY, SKIN
Anesthesia: Monitor Anesthesia Care | Laterality: Left

## 2016-10-15 MED ORDER — PROPOFOL 10 MG/ML IV BOLUS
INTRAVENOUS | Status: AC
  Filled 2016-10-15: qty 20

## 2016-10-15 MED ORDER — METOPROLOL TARTRATE 5 MG/5ML IV SOLN
5.0000 mg | Freq: Once | INTRAVENOUS | Status: AC
  Administered 2016-10-15: 5 mg via INTRAVENOUS

## 2016-10-15 MED ORDER — PHENYLEPHRINE HCL 10 MG/ML IJ SOLN
INTRAMUSCULAR | Status: DC | PRN
  Administered 2016-10-15: 100 ug via INTRAVENOUS
  Administered 2016-10-15: 60 ug via INTRAVENOUS

## 2016-10-15 MED ORDER — KETOROLAC TROMETHAMINE 30 MG/ML IJ SOLN
30.0000 mg | Freq: Once | INTRAMUSCULAR | Status: DC
Start: 2016-10-15 — End: 2016-10-15

## 2016-10-15 MED ORDER — MIDAZOLAM HCL 2 MG/2ML IJ SOLN
INTRAMUSCULAR | Status: AC
  Filled 2016-10-15: qty 2

## 2016-10-15 MED ORDER — PROPOFOL 500 MG/50ML IV EMUL
INTRAVENOUS | Status: DC | PRN
  Administered 2016-10-15: 150 ug/kg/min via INTRAVENOUS

## 2016-10-15 MED ORDER — FENTANYL CITRATE (PF) 100 MCG/2ML IJ SOLN: 25.0000 ug | INTRAMUSCULAR | Status: DC | PRN

## 2016-10-15 MED ORDER — POVIDONE-IODINE 10 % OINT PACKET
TOPICAL_OINTMENT | CUTANEOUS | Status: DC | PRN
  Administered 2016-10-15: 1 via TOPICAL

## 2016-10-15 MED ORDER — ONDANSETRON HCL 4 MG/2ML IJ SOLN
4.0000 mg | Freq: Once | INTRAMUSCULAR | Status: AC
  Administered 2016-10-15: 4 mg via INTRAVENOUS
  Filled 2016-10-15: qty 2

## 2016-10-15 MED ORDER — METOPROLOL TARTRATE 5 MG/5ML IV SOLN
INTRAVENOUS | Status: AC
  Filled 2016-10-15: qty 5

## 2016-10-15 MED ORDER — LIDOCAINE HCL (PF) 1 % IJ SOLN
INTRAMUSCULAR | Status: DC | PRN
  Administered 2016-10-15: 10 mL

## 2016-10-15 MED ORDER — LIDOCAINE HCL (PF) 1 % IJ SOLN
INTRAMUSCULAR | Status: AC
  Filled 2016-10-15: qty 30

## 2016-10-15 MED ORDER — MIDAZOLAM HCL 2 MG/2ML IJ SOLN
1.0000 mg | INTRAMUSCULAR | Status: AC
  Administered 2016-10-15: 2 mg via INTRAVENOUS

## 2016-10-15 MED ORDER — POTASSIUM CHLORIDE 20 MEQ PO PACK
60.0000 meq | PACK | Freq: Three times a day (TID) | ORAL | Status: DC
  Administered 2016-10-15: 60 meq via ORAL
  Filled 2016-10-15 (×2): qty 3

## 2016-10-15 MED ORDER — FENTANYL CITRATE (PF) 100 MCG/2ML IJ SOLN
25.0000 ug | Freq: Once | INTRAMUSCULAR | Status: AC
  Administered 2016-10-15: 25 ug via INTRAVENOUS
  Filled 2016-10-15: qty 2

## 2016-10-15 MED ORDER — PHENYLEPHRINE 40 MCG/ML (10ML) SYRINGE FOR IV PUSH (FOR BLOOD PRESSURE SUPPORT)
PREFILLED_SYRINGE | INTRAVENOUS | Status: AC
  Filled 2016-10-15: qty 10

## 2016-10-15 MED ORDER — LACTATED RINGERS IV SOLN
INTRAVENOUS | Status: DC
  Administered 2016-10-15: 09:00:00 via INTRAVENOUS

## 2016-10-15 MED ORDER — SODIUM CHLORIDE 0.9 % IR SOLN
Status: DC | PRN
  Administered 2016-10-15: 1000 mL

## 2016-10-15 MED ORDER — PROPOFOL 10 MG/ML IV BOLUS
INTRAVENOUS | Status: DC | PRN
  Administered 2016-10-15 (×2): 20 mg via INTRAVENOUS

## 2016-10-15 MED ORDER — MAGNESIUM SULFATE 50 % IJ SOLN
3.0000 g | Freq: Once | INTRAVENOUS | Status: AC
  Administered 2016-10-16: 3 g via INTRAVENOUS
  Filled 2016-10-15: qty 6

## 2016-10-15 MED ORDER — HYDROCODONE-ACETAMINOPHEN 5-325 MG PO TABS: 1.0000 | ORAL_TABLET | Freq: Four times a day (QID) | ORAL | 0 refills | Status: AC | PRN

## 2016-10-15 SURGICAL SUPPLY — 24 items
CHLORAPREP W/TINT 10.5 ML (MISCELLANEOUS) ×3 IMPLANT
CLOTH BEACON ORANGE TIMEOUT ST (SAFETY) ×3 IMPLANT
COVER LIGHT HANDLE STERIS (MISCELLANEOUS) ×3 IMPLANT
DECANTER SPIKE VIAL GLASS SM (MISCELLANEOUS) ×3 IMPLANT
DRSG TEGADERM 4X4.75 (GAUZE/BANDAGES/DRESSINGS) ×3 IMPLANT
ELECT REM PT RETURN 9FT ADLT (ELECTROSURGICAL) ×3
ELECTRODE REM PT RTRN 9FT ADLT (ELECTROSURGICAL) ×1 IMPLANT
FORMALIN 10 PREFIL 120ML (MISCELLANEOUS) ×3 IMPLANT
GLOVE BIO SURGEON STRL SZ7 (GLOVE) ×3 IMPLANT
GLOVE BIOGEL PI IND STRL 7.0 (GLOVE) ×1 IMPLANT
GLOVE BIOGEL PI IND STRL 7.5 (GLOVE) ×1 IMPLANT
GLOVE BIOGEL PI INDICATOR 7.0 (GLOVE) ×2
GLOVE BIOGEL PI INDICATOR 7.5 (GLOVE) ×2
GLOVE SURG SS PI 7.5 STRL IVOR (GLOVE) ×6 IMPLANT
GOWN STRL REUS W/TWL LRG LVL3 (GOWN DISPOSABLE) ×6 IMPLANT
NEEDLE HYPO 25X1 1.5 SAFETY (NEEDLE) ×3 IMPLANT
NS IRRIG 1000ML POUR BTL (IV SOLUTION) ×3 IMPLANT
PACK MINOR (CUSTOM PROCEDURE TRAY) ×3 IMPLANT
PAD ARMBOARD 7.5X6 YLW CONV (MISCELLANEOUS) ×3 IMPLANT
SPONGE GAUZE 2X2 8PLY STER LF (GAUZE/BANDAGES/DRESSINGS) ×2
SPONGE GAUZE 2X2 8PLY STRL LF (GAUZE/BANDAGES/DRESSINGS) ×4 IMPLANT
SUT PROLENE 3 0 PS 1 (SUTURE) ×6 IMPLANT
SYR CONTROL 10ML LL (SYRINGE) ×3 IMPLANT
SYRINGE 10CC LL (SYRINGE) ×3 IMPLANT

## 2016-10-15 NOTE — Interval H&P Note (Signed)
History and Physical Interval Note:  10/15/2016 8:13 AM  [name]  has presented today for surgery, with the diagnosis of recurrent breast cancer  The various methods of treatment have been discussed with the patient and family. After consideration of risks, benefits and other options for treatment, the patient has consented to  Procedure(s): BIOPSY SKIN (Left) as a surgical intervention .  The patient's history has been reviewed, patient examined, no change in status, stable for surgery.  I have reviewed the patient's chart and labs.  Questions were answered to the patient's satisfaction.     Aviva Signs

## 2016-10-15 NOTE — Progress Notes (Signed)
[name]  BJY:[id number] DOB: [birth year] DOA: 10/10/2016 PCP: Patient, No Pcp Per    Brief Narrative:  71 y.o. female with a remote history of left breast cancer, more recent history of right breast cancer, and HTN who presented to the ED w/ tachycardia, nausea, and vomiting. Patient was evaluated in the ED 1 month prior when CTA chest revealed mediastinal masses and vertebral lesions concerning for metastatic disease. This was suspected to be related to a new left breast mass and she was scheduled for a biopsy. However, the surgeon found her to be tachycardic in the 130s and she was sent to the Cardiology clinic for further evaluation. There an EKG confirmed a sinus rhythm, but the pt appeared to be markedly hypovolemic in the setting of nausea and vomiting.     Subjective:  Patient is status post left chest wall  biopsy. Patient with some pain otherwise hemodynamically stable  Assessment & Plan:  Sinus tachycardia -likely secondary to Dehydration/pain , ruled out for PE  this admission, given history of metastatic breast cancer Tachycardic with ambulation VQ scan to rule out PE, negative Troponin negative 3  Intractable N/V  Felt to be due to mediastinal masses displacing esophagus - tolerating her diet w/o difficulty at time  - denies any further N/V  ?Pancreatitis  Lipase signif elevated but exam not c/w pancreatitis w/ no epigastric discomfort - given resolution of sx further evaluation not currently required    Severe hypokalemia hypomagnesemia Patient is needing repletion every day, unable to tolerate oral potassium Will administer another 3 g of magnesium sulfate Increase oral potassium supplementation recheck labs tomorrow  Hx of B Breast CA w/ suspected recurrent metastatic Breast CA Left breast cancer early 1990s - right breast cancer 2005 - now appears to have mediastinal and vertebral metastatic disease - to have excisional biopsy per Dr. Arnoldo Morale  when stable (outpt) - will need extensive body imaging to rule out metastatic deposits elsewhere if this is proven to be CA - Dr. Arnoldo Morale has performed biopsy on left chest wall mass 9/12   Acute urinary retention, resolved Foley catheter placed 10/11/16 - RN   remove foley 9/9 and the patient has been able to void- patient was able to void, likely combination of  opioid and electrolyte abnormalities    Normocytic anemia No evidence of blood loss - Hgb holding steady   Suppressed TSH T4 and T3 within normal limits Repeat thyroid function in 4-6 weeks   Severe malnutrition -albumin is 2.5, will obtain nutrition consult   DVT prophylaxis: lovenox  Code Status: NCB/DNR Family Communication: no family present at time of exam  Disposition Plan:  Anticipate biopsy procedure tomorrow  Consultants:  Surgery  Procedures: None   Antimicrobials:  none  Objective: Blood pressure 104/66, pulse 81, temperature 97.8 F (36.6 C), resp. rate 20, height 5\' 7"  (1.702 m), weight 60 kg (132 lb 4.4 oz), SpO2 100 %.  Intake/Output Summary (Last 24 hours) at 10/15/16 1200 Last data filed at 10/15/16 0927  Gross per 24 hour  Intake              780 ml  Output              360 ml  Net              420 ml   Filed Weights   10/14/16 0500 10/14/16 0639 10/15/16 0610  Weight: 58.2 kg (128 lb 4.8 oz) 61.1 kg (134  lb 11.2 oz) 60 kg (132 lb 4.4 oz)    Examination: General: No acute respiratory distress - alert and oriented x4 Lungs: Clear to auscultation B - no wheezing  Cardiovascular: RRR w/o M or rub  Abdomen: Nontender, nondistended, soft, bowel sounds positive, no rebound Extremities: No edema bilateral lower extremities  CBC:  Recent Labs Lab 10/10/16 1659 10/11/16 0805 10/12/16 0420 10/13/16 0624 47/09/62 8366  WBC DUPLICATE  29.4* 76.5 9.9 10.3 10.2  NEUTROABS 9.3*  --   --   --   --   HGB DUPLICATE  46.5* 8.7* 9.2* 9.1* 8.7*  HCT DUPLICATE  03.5* 46.5* 68.1* 28.1* 27.5*    MCV DUPLICATE  17.0 01.7 49.4 49.6 75.9  PLT DUPLICATE  163 846 659 935 701   Basic Metabolic Panel:  Recent Labs Lab 10/10/16 1659 10/11/16 0805 10/12/16 0420 10/13/16 0624 10/14/16 0439 10/15/16 0631  NA 138 137 139 135 135 134*  K 3.1* 2.7* 3.0* 3.0* 3.4* 3.0*  CL 92* 100* 104 100* 99* 95*  CO2 24 23 26 25 27 27   GLUCOSE 106* 88 101* 111* 94 107*  BUN 25* 16 9 6  5* <5*  CREATININE 0.85 0.51 0.45 0.39* 0.36* 0.41*  CALCIUM 9.6 8.4* 8.7* 8.7* 8.5* 8.7*  MG 1.8  --  1.5* 1.5* 1.4* 1.5*  PHOS 2.5  --   --  1.6*  --   --    GFR: Estimated Creatinine Clearance: 60.2 mL/min (A) (by C-G formula based on SCr of 0.41 mg/dL (L)).  Liver Function Tests:  Recent Labs Lab 10/10/16 1659 10/12/16 0420 10/13/16 0624 10/14/16 0439  AST 29 29 31 29   ALT 17 14 13* 12*  ALKPHOS 213* 199* 242* 247*  BILITOT 1.5* 1.0 0.8 1.0  PROT 8.0 6.2* 6.2* 5.9*  ALBUMIN 3.7 2.9* 2.8* 2.5*    Recent Labs Lab 10/10/16 1659 10/13/16 0624  LIPASE 115* 75*    HbA1C: Hgb A1c MFr Bld  Date/Time Value Ref Range Status  11/26/2010 01:10 AM 6.4 (H) <5.7 % Final    Comment:    (NOTE)                                                                       According to the ADA Clinical Practice Recommendations for 2011, when HbA1c is used as a screening test:  >=6.5%   Diagnostic of Diabetes Mellitus           (if abnormal result is confirmed) 5.7-6.4%   Increased risk of developing Diabetes Mellitus References:Diagnosis and Classification of Diabetes Mellitus,Diabetes XBLT,9030,09(QZRAQ 1):S62-S69 and Standards of Medical Care in         Diabetes - 2011,Diabetes Care,2011,34 (Suppl 1):S11-S61.    Scheduled Meds: . enoxaparin (LOVENOX) injection  40 mg Subcutaneous Q24H  . metoprolol tartrate  12.5 mg Oral BID  . potassium chloride  60 mEq Oral TID  . sodium chloride flush  3 mL Intravenous Q12H     LOS: 2 days     On-Call/Text Page:      [email or web address]      password [email or web address]  If  7PM-7AM, please contact night-coverage [email or web address] Password [email or web address] 10/15/2016, 12:00 PM

## 2016-10-15 NOTE — Transfer of Care (Signed)
Immediate Anesthesia Transfer of Care Note  Patient: [name]  Procedure(s) Performed: Procedure(s): BIOPSY LEFT CHEST WALL MASS (Left)  Patient Location: PACU  Anesthesia Type:MAC  Level of Consciousness: awake, alert , oriented and patient cooperative  Airway & Oxygen Therapy: Patient Spontanous Breathing and Patient connected to nasal cannula oxygen  Post-op Assessment: Report given to RN and Post -op Vital signs reviewed and stable  Post vital signs: Reviewed and stable  Last Vitals:  Vitals:   10/15/16 0850 10/15/16 0855  BP: 114/72   Pulse:    Resp: (!) 44 (!) 106  Temp:    SpO2: 99% 99%    Last Pain:  Vitals:   10/15/16 0610  TempSrc: Oral  PainSc:          Complications: No apparent anesthesia complications

## 2016-10-15 NOTE — Anesthesia Preprocedure Evaluation (Signed)
Anesthesia Evaluation  Patient identified by MRN, date of birth, ID band Patient awake    Reviewed: Allergy & Precautions, NPO status , Patient's Chart, lab work & pertinent test results, reviewed documented beta blocker date and time   Airway Mallampati: II  TM Distance: >3 FB     Dental  (+) Edentulous Upper, Edentulous Lower   Pulmonary COPD, former smoker,    breath sounds clear to auscultation       Cardiovascular hypertension, Pt. on medications and Pt. on home beta blockers +CHF  + dysrhythmias (started on beta blocker)  Rhythm:Regular Rate:Tachycardia     Neuro/Psych Anxiety    GI/Hepatic   Endo/Other    Renal/GU      Musculoskeletal   Abdominal   Peds  Hematology  (+) anemia ,   Anesthesia Other Findings rec breast cancer  Reproductive/Obstetrics                             Anesthesia Physical Anesthesia Plan  ASA: IV  Anesthesia Plan: MAC   Post-op Pain Management:    Induction: Intravenous  PONV Risk Score and Plan:   Airway Management Planned: Simple Face Mask  Additional Equipment:   Intra-op Plan:   Post-operative Plan:   Informed Consent: I have reviewed the patients History and Physical, chart, labs and discussed the procedure including the risks, benefits and alternatives for the proposed anesthesia with the patient or authorized representative who has indicated his/her understanding and acceptance.     Plan Discussed with:   Anesthesia Plan Comments:         Anesthesia Quick Evaluation

## 2016-10-15 NOTE — H&P (View-Only) (Signed)
Patient understands now the procedure being performed on 10/15/2016. The risks and benefits the procedure were fully explained to the patient, who gave informed consent.

## 2016-10-15 NOTE — Progress Notes (Signed)
Physical Therapy Treatment Patient Details Name: [name] MRN: [id number] DOB: 11-07-1945 Today's Date: 10/15/2016    History of Present Illness [name] is a 71 y.o. female with medical history significant for remote history of left breast cancer, more recent history of right breast cancer, and hypertension, now presenting to the emergency department for evaluation of tachycardia, nausea, and vomiting. Patient was evaluated in the emergency department in 1 month ago with a cough, noted to be tachycardic, and underwent CTA chest that was negative for PE, but notable for mediastinal masses and vertebral lesions concerning for metastatic disease. This was suspected to be related to a new left breast mass and she was scheduled for biopsy of this. The surgeon found her to be tachycardic in the 130s and she was sent to the cardiology clinic for further evaluation. EKG confirms a sinus rhythm, and the tachycardia is felt to be secondary to her marked hypovolemia in the setting of nausea and vomiting, with likely contributions from her hypermetabolic state secondary to cancer, poor nutritional status, and her anemia. Patient has had ongoing nausea with vomiting for weeks, worsening. Only mild abdominal discomfort, but no pain. No melena or hematochezia. No fevers or chills.    PT Comments    Pt supine in bed with family present and willing to participate.  Pt limited by Lt side pain following biopsy earlier today but was willing to participate.  Pt independent with bed mobility following assistance with IV and removal of SCD.  Pt with min guard gait training with RW 140 feet, no LOB episodes noted was limited by fatigue.  EOS pt left in bed with call bell within reach, SCD reapplied and RN informed for pain medication if appropriate.      Follow Up Recommendations        Equipment Recommendations       Recommendations for Other Services       Precautions / Restrictions  Precautions Precautions: Fall Precaution Comments: spine/back precautions Restrictions Weight Bearing Restrictions: No Other Position/Activity Restrictions: spine supported with pillow to neutral posture due to suspected spine mets    Mobility  Bed Mobility Overal bed mobility: Independent             General bed mobility comments: Pt independently supine to sit without assistance this session, assisted with IV and removal of SCD  Transfers Overall transfer level: Modified independent Equipment used: Rolling walker (2 wheeled) Transfers: Sit to/from Stand Sit to Stand: Min guard         General transfer comment: cueing for hand placement for assistance and safety  Ambulation/Gait Ambulation/Gait assistance: Min guard Ambulation Distance (Feet): 140 Feet Assistive device: Rolling walker (2 wheeled) Gait Pattern/deviations: Step-to pattern;Step-through pattern;Shuffle;Decreased stride length;Trunk flexed;Narrow base of support Gait velocity: reduced   General Gait Details: gradually decreased clearance of feet with effort as it went on   Stairs            Wheelchair Mobility    Modified Rankin (Stroke Patients Only)       Balance                                            Cognition Arousal/Alertness: Awake/alert Behavior During Therapy: WFL for tasks assessed/performed Overall Cognitive Status: Within Functional Limits for tasks assessed  Exercises      General Comments        Pertinent Vitals/Pain Pain Assessment: 0-10 Pain Score: 10-Worst pain ever Pain Location: Abdominal Lt side following biopsy earlier today, monitored through session, RN informed of pain at EOS wiht pain meds requested Pain Descriptors / Indicators: Sore Pain Intervention(s): Limited activity within patient's tolerance;Monitored during session;Patient requesting pain meds-RN notified;Repositioned;Ice  applied    Home Living                      Prior Function            PT Goals (current goals can now be found in the care plan section)      Frequency    Min 3X/week      PT Plan Current plan remains appropriate    Co-evaluation              AM-PAC PT "6 Clicks" Daily Activity  Outcome Measure  Difficulty turning over in bed (including adjusting bedclothes, sheets and blankets)?: None Difficulty moving from lying on back to sitting on the side of the bed? : A Little Difficulty sitting down on and standing up from a chair with arms (e.g., wheelchair, bedside commode, etc,.)?: A Little Help needed moving to and from a bed to chair (including a wheelchair)?: A Little Help needed walking in hospital room?: A Little Help needed climbing 3-5 steps with a railing? : A Lot 6 Click Score: 18    End of Session Equipment Utilized During Treatment: Gait belt Activity Tolerance: Patient limited by fatigue Patient left: in bed;with call bell/phone within reach;with family/visitor present;with SCD's reapplied Nurse Communication: Mobility status PT Visit Diagnosis: Unsteadiness on feet (R26.81);Other abnormalities of gait and mobility (R26.89);Muscle weakness (generalized) (M62.81)     Time: 0370-4888 PT Time Calculation (min) (ACUTE ONLY): 28 min  Charges:  $Gait Training: 8-22 mins $Therapeutic Activity: 8-22 mins                    G Codes:       Ihor Austin, LPTA; CBIS [phone]   Aldona Lento 10/15/2016, 3:34 PM

## 2016-10-15 NOTE — Op Note (Signed)
Patient:  [name]  DOB:  07-31-1945  MRN:  [id number]   Preop Diagnosis:  Left chest wall mass, history of breast cancer  Postop Diagnosis:  Same  Procedure:  Biopsy of soft tissue, left chest wall  Surgeon:  Aviva Signs, M.D.   Anes:  Mac  Indications:  Patient is a 71 year old black female with a history of breast cancer who presents with a new chest wall mass and close proximity to the previous left modified radical mastectomy scar. She has been treated in the past for this. There is a concern that this is recurrence of her breast cancer. Oncology has requested a biopsy. The risks and benefits of the procedure were fully explained to the patient, who gave informed consent.  Procedure note:  The patient was placed in supine position. The left chest was prepped and draped using the usual sterile technique with DuraPrep. One percent Xylocaine was used for local anesthesia.  An elliptical incision was made over the mass which measured almost 3 cm in its greatest diameter. The mass was noted to be extending from the skin down to the fascia of the chest wall. It was fully excised. It was sent to pathology for further examination. A bleeding was controlled using Bovie electrocautery. The skin was reapproximated using 3-0 nylon vertical mattress sutures. Betadine ointment and a dry sterile dressing were applied.  All tape and needle counts were correct at the end of the procedure. The patient was transferred to PACU in stable condition.  Complications:  None  EBL:  Minimal  Specimen:  Left soft tissue chest wall biopsy

## 2016-10-15 NOTE — Anesthesia Postprocedure Evaluation (Signed)
Anesthesia Post Note  Patient: [name]  Procedure(s) Performed: Procedure(s) (LRB): BIOPSY LEFT CHEST WALL MASS (Left)  Patient location during evaluation: PACU Anesthesia Type: MAC Level of consciousness: awake, awake and alert, oriented and patient cooperative Pain management: pain level controlled Vital Signs Assessment: post-procedure vital signs reviewed and stable Respiratory status: nonlabored ventilation, spontaneous breathing, respiratory function stable and patient connected to nasal cannula oxygen Cardiovascular status: stable Postop Assessment: no signs of nausea or vomiting Anesthetic complications: no     Last Vitals:  Vitals:   10/15/16 0850 10/15/16 0855  BP: 114/72   Pulse:    Resp: (!) 44 (!) 106  Temp:    SpO2: 99% 99%    Last Pain:  Vitals:   10/15/16 0610  TempSrc: Oral  PainSc:                  Cambree Hendrix L

## 2016-10-16 ENCOUNTER — Encounter (HOSPITAL_COMMUNITY): Payer: Self-pay | Admitting: General Surgery

## 2016-10-16 LAB — COMPREHENSIVE METABOLIC PANEL
ALBUMIN: 2.5 g/dL — AB (ref 3.5–5.0)
ALK PHOS: 247 U/L — AB (ref 38–126)
ALT: 12 U/L — ABNORMAL LOW (ref 14–54)
AST: 24 U/L (ref 15–41)
Anion gap: 12 (ref 5–15)
BUN: 5 mg/dL — AB (ref 6–20)
CALCIUM: 8.4 mg/dL — AB (ref 8.9–10.3)
CHLORIDE: 94 mmol/L — AB (ref 101–111)
CO2: 30 mmol/L (ref 22–32)
CREATININE: 0.43 mg/dL — AB (ref 0.44–1.00)
GFR calc Af Amer: >60 mL/min (ref >60)
GFR calc non Af Amer: >60 mL/min (ref >60)
GLUCOSE: 96 mg/dL (ref 65–99)
Potassium: 2.6 mmol/L — CL (ref 3.5–5.1)
SODIUM: 136 mmol/L (ref 135–145)
Total Bilirubin: 0.6 mg/dL (ref 0.3–1.2)
Total Protein: 6 g/dL — ABNORMAL LOW (ref 6.5–8.1)

## 2016-10-16 LAB — MAGNESIUM: Magnesium: 1.3 mg/dL — ABNORMAL LOW (ref 1.7–2.4)

## 2016-10-16 MED ORDER — POTASSIUM CHLORIDE 20 MEQ PO PACK
60.0000 meq | PACK | Freq: Four times a day (QID) | ORAL | Status: AC
  Administered 2016-10-16 – 2016-10-17 (×2): 60 meq via ORAL
  Filled 2016-10-16 (×3): qty 3

## 2016-10-16 MED ORDER — MAGNESIUM OXIDE 400 (241.3 MG) MG PO TABS
800.0000 mg | ORAL_TABLET | Freq: Three times a day (TID) | ORAL | Status: DC
  Administered 2016-10-16 – 2016-10-17 (×4): 800 mg via ORAL
  Filled 2016-10-16 (×4): qty 2

## 2016-10-16 MED ORDER — HYDROCODONE-ACETAMINOPHEN 5-325 MG PO TABS
1.0000 | ORAL_TABLET | ORAL | Status: DC | PRN
  Administered 2016-10-16: 1 via ORAL
  Filled 2016-10-16: qty 1

## 2016-10-16 MED ORDER — METOPROLOL TARTRATE 25 MG PO TABS
25.0000 mg | ORAL_TABLET | Freq: Two times a day (BID) | ORAL | Status: DC
  Administered 2016-10-16 – 2016-10-17 (×3): 25 mg via ORAL
  Filled 2016-10-16 (×2): qty 1

## 2016-10-16 NOTE — Care Management Note (Signed)
Case Management Note  Patient Details  Name: [name] MRN: [id number] Date of Birth: 11-08-1945  If discussed at [place] Length of Stay Meetings, dates discussed:  10/16/2016   Sherald Barge, RN 10/16/2016, 1:58 PM

## 2016-10-16 NOTE — Progress Notes (Signed)
1 Day Post-Op  Subjective: Patient denies any incisional pain.  Objective: Vital signs in last 24 hours: Temp:  [97.7 F (36.5 C)-98.4 F (36.9 C)] 98.4 F (36.9 C) (09/13 0529) Pulse Rate:  [81-123] 123 (09/13 0529) Resp:  [20-106] 21 (09/13 0529) BP: (83-129)/(52-73) 118/67 (09/13 0529) SpO2:  [92 %-100 %] 95 % (09/13 0529) Weight:  [126 lb 8.7 oz (57.4 kg)] 126 lb 8.7 oz (57.4 kg) (09/13 0529) Last BM Date: 10/14/16  Intake/Output from previous day: 09/12 0701 - 09/13 0700 In: 200 [I.V.:200] Out: 910 [Urine:900; Blood:10] Intake/Output this shift: No intake/output data recorded.  General appearance: alert, cooperative and no distress Skin: Left chest wall incision site dressing dry and intact.  Lab Results:   Recent Labs  10/14/16 0439  WBC 10.2  HGB 8.7*  HCT 27.3*  PLT 195   BMET  Recent Labs  10/15/16 0631 10/16/16 0554  NA 134* 136  K 3.0* 2.6*  CL 95* 94*  CO2 27 30  GLUCOSE 107* 96  BUN <5* 5*  CREATININE 0.41* 0.43*  CALCIUM 8.7* 8.4*   PT/INR No results for input(s): LABPROT, INR in the last 72 hours.  Studies/Results: No results found.  Anti-infectives: Anti-infectives    Start     Dose/Rate Route Frequency Ordered Stop   10/15/16 0600  ceFAZolin (ANCEF) IVPB 2g/100 mL premix     2 g 200 mL/hr over 30 Minutes Intravenous On call to O.R. 10/14/16 2046 10/15/16 0900      Assessment/Plan: s/p Procedure(s): BIOPSY LEFT CHEST WALL MASS Impression: Patient continues to have hypokalemia. Biopsy site healing well. Awaiting final pathology result.  LOS: 3 days    Aviva Signs 10/16/2016

## 2016-10-16 NOTE — Addendum Note (Signed)
Addendum  created 10/16/16 4069 by Vista Deck, CRNA   Sign clinical note

## 2016-10-16 NOTE — Care Management Important Message (Signed)
Important Message  Patient Details  Name: [name] MRN: [id number] Date of Birth: Aug 26, 1945   Medicare Important Message Given:  Yes    Sherald Barge, RN 10/16/2016, 2:01 PM

## 2016-10-16 NOTE — Anesthesia Postprocedure Evaluation (Signed)
Anesthesia Post Note  Patient: [name]  Procedure(s) Performed: Procedure(s) (LRB): BIOPSY LEFT CHEST WALL MASS (Left)  Patient location during evaluation: Nursing Unit Anesthesia Type: MAC Level of consciousness: awake Pain management: pain level not controlled Vital Signs Assessment: post-procedure vital signs reviewed and stable Respiratory status: spontaneous breathing Cardiovascular status: tachycardic (K+ remains low) Postop Assessment: no signs of nausea or vomiting Anesthetic complications: no     Last Vitals:  Vitals:   10/15/16 2114 10/16/16 0529  BP: 129/66 118/67  Pulse: (!) 106 (!) 123  Resp: (!) 21 (!) 21  Temp: 36.8 C 36.9 C  SpO2: 96% 95%    Last Pain:  Vitals:   10/16/16 0529  TempSrc: Oral  PainSc:                  Drucie Opitz

## 2016-10-16 NOTE — Progress Notes (Signed)
CRITICAL VALUE ALERT  Critical Value:  Potassium 2.6  Date & Time Notied:  10/16/16 0700  Provider Notified: Allyson Sabal  Orders Received/Actions taken: encourage to take Potassium

## 2016-10-16 NOTE — Progress Notes (Signed)
Physical Therapy Treatment Patient Details Name: [name] MRN: [id number] DOB: [birth year] Today's Date: 10/16/2016    History of Present Illness [name] is a 71 y.o. female with medical history significant for remote history of left breast cancer, more recent history of right breast cancer, and hypertension, now presenting to the emergency department for evaluation of tachycardia, nausea, and vomiting. Patient was evaluated in the emergency department in 1 month ago with a cough, noted to be tachycardic, and underwent CTA chest that was negative for PE, but notable for mediastinal masses and vertebral lesions concerning for metastatic disease. This was suspected to be related to a new left breast mass and she was scheduled for biopsy of this. The surgeon found her to be tachycardic in the 130s and she was sent to the cardiology clinic for further evaluation. EKG confirms a sinus rhythm, and the tachycardia is felt to be secondary to her marked hypovolemia in the setting of nausea and vomiting, with likely contributions from her hypermetabolic state secondary to cancer, poor nutritional status, and her anemia. Patient has had ongoing nausea with vomiting for weeks, worsening. Only mild abdominal discomfort, but no pain. No melena or hematochezia. No fevers or chills.    PT Comments    Pt supine in bed and willing to participate with therapy today.  Pt continues to c/o pain in side with biopsy yesterday.  Gait training complete with min guard with RW.  Pt c/o dizziness during gait training.  Following report pt sat in [facility] and vitals assessed.  RN aware of vitals.  Pt wheeled back to room and left in chair with RN in room.  Ice applied to side for pain control.    Follow Up Recommendations        Equipment Recommendations       Recommendations for Other Services       Precautions / Restrictions Precautions Precautions: Fall Precaution Comments: spine/back  precautions Restrictions Weight Bearing Restrictions: No Other Position/Activity Restrictions: spine supported with pillow to neutral posture due to suspected spine mets    Mobility  Bed Mobility Overal bed mobility: Independent Bed Mobility: Supine to Sit       Sit to supine: Supervision   General bed mobility comments: Pt independently supine to sit without assistance this session, assisted with IV and removal of SCD  Transfers Overall transfer level: Modified independent Equipment used: Rolling walker (2 wheeled) Transfers: Sit to/from Stand Sit to Stand: Min guard Stand pivot transfers: Min guard       General transfer comment: cueing for hand placement for assistance and safety  Ambulation/Gait Ambulation/Gait assistance: Min guard Ambulation Distance (Feet): 110 Feet (c/o dizziness during gait training, sat in [facility] following reports of dizziness) Assistive device: Rolling walker (2 wheeled) Gait Pattern/deviations: Step-to pattern;Step-through pattern;Shuffle;Decreased stride length;Trunk flexed;Narrow base of support Gait velocity: reduced   General Gait Details: improved gait mechanics, sat in WC following reports of dizziness   Stairs            Wheelchair Mobility    Modified Rankin (Stroke Patients Only)       Balance                                            Cognition Arousal/Alertness: Awake/alert Behavior During Therapy: WFL for tasks assessed/performed Overall Cognitive Status: Within Functional Limits for tasks assessed  Exercises      General Comments        Pertinent Vitals/Pain Pain Score: 10-Worst pain ever Pain Location: Abdominal Lt side following biopsy yesterday, monitored pain and vitals through session, pt sat in [facility] and vitals assessed following reports of dizziness during gait training through session, RN aware of status  Pain Intervention(s):  Limited activity within patient's tolerance;Monitored during session;Repositioned;Ice applied (RN aware of vitals following reports of dizziness/lightheadedness)    Home Living                      Prior Function            PT Goals (current goals can now be found in the care plan section) Progress towards PT goals: Progressing toward goals    Frequency           PT Plan Current plan remains appropriate    Co-evaluation              AM-PAC PT "6 Clicks" Daily Activity  Outcome Measure  Difficulty turning over in bed (including adjusting bedclothes, sheets and blankets)?: None Difficulty moving from lying on back to sitting on the side of the bed? : A Little Difficulty sitting down on and standing up from a chair with arms (e.g., wheelchair, bedside commode, etc,.)?: A Little Help needed moving to and from a bed to chair (including a wheelchair)?: A Little Help needed walking in hospital room?: A Little Help needed climbing 3-5 steps with a railing? : A Lot 6 Click Score: 18    End of Session Equipment Utilized During Treatment: Gait belt Activity Tolerance: Patient limited by fatigue;Other (comment) (c/o dizziness, RN aware and vitals taken) Patient left: in chair;with call bell/phone within reach;with nursing/sitter in room;with family/visitor present Nurse Communication: Mobility status PT Visit Diagnosis: Unsteadiness on feet (R26.81);Other abnormalities of gait and mobility (R26.89);Muscle weakness (generalized) (M62.81)     Time: 4944-9675 PT Time Calculation (min) (ACUTE ONLY): 33 min  Charges:  $Gait Training: 23-37 mins                    G Codes:       Ihor Austin, [place]; CBIS [phone]  Aldona Lento 10/16/2016, 12:20 PM

## 2016-10-16 NOTE — Progress Notes (Signed)
[place]  KGU:[id number] DOB: Oct 06, 1945 DOA: 10/10/2016 PCP: [name], No Pcp Per    Brief Narrative:  71 y.o. female with a remote history of left breast cancer, more recent history of right breast cancer, and HTN who presented to the ED w/ tachycardia, nausea, and vomiting. [name] was evaluated in the ED 1 month prior when CTA chest revealed mediastinal masses and vertebral lesions concerning for metastatic disease. This was suspected to be related to a new left breast mass and she was scheduled for a biopsy. However, the surgeon found her to be tachycardic in the 130s and she was sent to the Cardiology clinic for further evaluation. There an EKG confirmed a sinus rhythm, but the pt appeared to be markedly hypovolemic in the setting of nausea and vomiting.     Subjective:  [name] refused to take oral potassium last night, wants to go home  Assessment & Plan: Sinus tachycardia -likely secondary to Dehydration/pain , ruled out for PE  this admission, given history of metastatic breast cancer Tachycardic with ambulation VQ scan to rule out PE, negative Troponin negative 3 Increased dose of metoprolol to 25 twice a day   Intractable N/V  Felt to be due to mediastinal masses displacing esophagus - tolerating her diet w/o difficulty at time  - denies any further N/V    Severe hypokalemia, hypomagnesemia [name] is needing repletion every day, unable to tolerate oral potassium, encouraged intake of oral supplements so that she can be discharged home soon  Will administer another 3 g of magnesium sulfate Increase oral potassium supplementation , recheck labs tomorrow   Hx of B Breast CA w/ suspected recurrent metastatic Breast CA Left breast cancer early 1990s - right breast cancer 2005 - now appears to have mediastinal and vertebral metastatic disease - to have excisional biopsy per Dr. Arnoldo Morale when stable (outpt) - will need extensive body imaging to rule out  metastatic deposits elsewhere if this is proven to be CA - Dr. Arnoldo Morale has performed biopsy on left chest wall mass 9/12   Acute urinary retention, resolved Foley catheter placed 10/11/16 - RN   removed foley 9/9 and the [name] has been able to void- [name] was able to void, likely combination of  opioid and electrolyte abnormalities    Normocytic anemia No evidence of blood loss - Hgb holding steady   Suppressed TSH T4 and T3 within normal limits Repeat thyroid function in 4-6 weeks   Severe malnutrition -albumin is 2.5, will obtain nutrition consult   DVT prophylaxis: lovenox  Code Status: NCB/DNR Family Communication: no family present at time of exam  Disposition Plan:  Anticipate discharge once electrolytes improve   Consultants:  Surgery  Procedures: None   Antimicrobials:  none  Objective: Blood pressure 118/67, pulse (!) 123, temperature 98.4 F (36.9 C), temperature source Oral, resp. rate (!) 21, height 5\' 7"  (1.702 m), weight 57.4 kg (126 lb 8.7 oz), SpO2 95 %.  Intake/Output Summary (Last 24 hours) at 10/16/16 1159 Last data filed at 10/16/16 0856  Gross per 24 hour  Intake              240 ml  Output              900 ml  Net             -660 ml   Filed Weights   10/14/16 0639 10/15/16 0610 10/16/16 0529  Weight: 61.1 kg (134 lb 11.2 oz) 60  kg (132 lb 4.4 oz) 57.4 kg (126 lb 8.7 oz)    Examination: General: No acute respiratory distress - alert and oriented x4 Lungs: Clear to auscultation B - no wheezing  Cardiovascular: RRR w/o M or rub  Abdomen: Nontender, nondistended, soft, bowel sounds positive, no rebound Extremities: No edema bilateral lower extremities  CBC:  Recent Labs Lab 10/10/16 1659 10/11/16 0805 10/12/16 0420 10/13/16 0624 88/41/66 0630  WBC DUPLICATE  16.0* 10.9 9.9 10.3 10.2  NEUTROABS 9.3*  --   --   --   --   HGB DUPLICATE  32.3* 8.7* 9.2* 9.1* 8.7*  HCT DUPLICATE  55.7* 32.2* 02.5* 42.7* 06.2*  MCV DUPLICATE  37.6  28.3 15.1 76.1 60.7  PLT DUPLICATE  371 062 [phone]   Basic Metabolic Panel:  Recent Labs Lab 10/10/16 1659  10/12/16 0420 10/13/16 0624 10/14/16 0439 10/15/16 0631 10/16/16 0554  NA 138  < > 139 135 135 134* 136  K 3.1*  < > 3.0* 3.0* 3.4* 3.0* 2.6*  CL 92*  < > 104 100* 99* 95* 94*  CO2 24  < > 26 25 27 27 30   GLUCOSE 106*  < > 101* 111* 94 107* 96  BUN 25*  < > 9 6 5* <5* 5*  CREATININE 0.85  < > 0.45 0.39* 0.36* 0.41* 0.43*  CALCIUM 9.6  < > 8.7* 8.7* 8.5* 8.7* 8.4*  MG 1.8  --  1.5* 1.5* 1.4* 1.5* 1.3*  PHOS 2.5  --   --  1.6*  --   --   --   < > = values in this interval not displayed. GFR: Estimated Creatinine Clearance: 57.6 mL/min (A) (by C-G formula based on SCr of 0.43 mg/dL (L)).  Liver Function Tests:  Recent Labs Lab 10/10/16 1659 10/12/16 0420 10/13/16 0624 10/14/16 0439 10/16/16 0554  AST 29 29 31 29 24   ALT 17 14 13* 12* 12*  ALKPHOS 213* 199* 242* 247* 247*  BILITOT 1.5* 1.0 0.8 1.0 0.6  PROT 8.0 6.2* 6.2* 5.9* 6.0*  ALBUMIN 3.7 2.9* 2.8* 2.5* 2.5*    Recent Labs Lab 10/10/16 1659 10/13/16 0624  LIPASE 115* 75*    HbA1C: Hgb A1c MFr Bld  Date/Time Value Ref Range Status  11/26/2010 01:10 AM 6.4 (H) <5.7 % Final    Comment:    (NOTE)                                                                       According to the ADA Clinical Practice Recommendations for 2011, when HbA1c is used as a screening test:  >=6.5%   Diagnostic of Diabetes Mellitus           (if abnormal result is confirmed) 5.7-6.4%   Increased risk of developing Diabetes Mellitus References:Diagnosis and Classification of Diabetes Mellitus,Diabetes OJJK,0938,18(EXHBZ 1):S62-S69 and Standards of Medical Care in         Diabetes - 2011,Diabetes Care,2011,34 (Suppl 1):S11-S61.    Scheduled Meds: . enoxaparin (LOVENOX) injection  40 mg Subcutaneous Q24H  . magnesium oxide  800 mg Oral TID  . metoprolol tartrate  12.5 mg Oral BID  . potassium chloride  60 mEq  Oral QID  . sodium chloride flush  3 mL Intravenous Q12H     LOS: 3 days     On-Call/Text Page:      [email or web address]      password [email or web address]  If 7PM-7AM, please contact night-coverage [email or web address] Password [email or web address] 10/16/2016, 11:59 AM

## 2016-10-17 ENCOUNTER — Inpatient Hospital Stay (HOSPITAL_COMMUNITY): Payer: Medicare HMO

## 2016-10-17 DIAGNOSIS — I10 Essential (primary) hypertension: Secondary | ICD-10-CM

## 2016-10-17 LAB — CBC
HEMATOCRIT: 27 % — AB (ref 36.0–46.0)
Hemoglobin: 8.6 g/dL — ABNORMAL LOW (ref 12.0–15.0)
MCH: 26.6 pg (ref 26.0–34.0)
MCHC: 31.9 g/dL (ref 30.0–36.0)
MCV: 83.6 fL (ref 78.0–100.0)
Platelets: 166 10*3/uL (ref 150–400)
RBC: 3.23 MIL/uL — ABNORMAL LOW (ref 3.87–5.11)
RDW: 16.2 % — AB (ref 11.5–15.5)
WBC: 8.3 10*3/uL (ref 4.0–10.5)

## 2016-10-17 LAB — ECHOCARDIOGRAM COMPLETE
AVLVOTPG: 3 mmHg
CHL CUP DOP CALC LVOT VTI: 18 cm
E decel time: 116 msec
FS: 35 % (ref 28–44)
HEIGHTINCHES: 67 in
IV/PV OW: 0.89
LA ID, A-P, ES: 26 mm
LA diam index: 1.59 cm/m2
LA vol A4C: 23.5 ml
LA vol: 25.4 mL
LAVOLIN: 15.5 mL/m2
LEFT ATRIUM END SYS DIAM: 26 mm
LVOT SV: 51 mL
LVOT area: 2.84 cm2
LVOTD: 19 mm
LVOTPV: 87.5 cm/s
MV Dec: 116
MV Peak grad: 3 mmHg
MV pk E vel: 92.1 m/s
PW: 10 mm — AB (ref 0.6–1.1)
RV LATERAL S' VELOCITY: 12.1 cm/s
TAPSE: 18.3 mm
WEIGHTICAEL: 2017.65 [oz_av]

## 2016-10-17 LAB — COMPREHENSIVE METABOLIC PANEL
ALBUMIN: 2.6 g/dL — AB (ref 3.5–5.0)
ALT: 13 U/L — AB (ref 14–54)
AST: 26 U/L (ref 15–41)
Alkaline Phosphatase: 242 U/L — ABNORMAL HIGH (ref 38–126)
Anion gap: 10 (ref 5–15)
BUN: 6 mg/dL (ref 6–20)
CHLORIDE: 96 mmol/L — AB (ref 101–111)
CO2: 28 mmol/L (ref 22–32)
Calcium: 8.2 mg/dL — ABNORMAL LOW (ref 8.9–10.3)
Creatinine, Ser: 0.44 mg/dL (ref 0.44–1.00)
GFR calc Af Amer: >60 mL/min (ref >60)
GFR calc non Af Amer: >60 mL/min (ref >60)
Glucose, Bld: 111 mg/dL — ABNORMAL HIGH (ref 65–99)
POTASSIUM: 3.5 mmol/L (ref 3.5–5.1)
Sodium: 134 mmol/L — ABNORMAL LOW (ref 135–145)
Total Bilirubin: 0.9 mg/dL (ref 0.3–1.2)
Total Protein: 6.2 g/dL — ABNORMAL LOW (ref 6.5–8.1)

## 2016-10-17 LAB — MAGNESIUM: Magnesium: 2 mg/dL (ref 1.7–2.4)

## 2016-10-17 LAB — PHOSPHORUS: Phosphorus: 2.2 mg/dL — ABNORMAL LOW (ref 2.5–4.6)

## 2016-10-17 MED ORDER — PERFLUTREN LIPID MICROSPHERE
1.0000 mL | INTRAVENOUS | Status: AC | PRN
  Administered 2016-10-17 (×2): 1 mL via INTRAVENOUS
  Administered 2016-10-17: 2 mL via INTRAVENOUS
  Filled 2016-10-17: qty 10

## 2016-10-17 MED ORDER — POTASSIUM CHLORIDE 20 MEQ PO PACK
40.0000 meq | PACK | Freq: Three times a day (TID) | ORAL | 1 refills | Status: AC
Start: 2016-10-17 — End: 2016-11-16

## 2016-10-17 MED ORDER — MAGNESIUM OXIDE 400 (241.3 MG) MG PO TABS: 800.0000 mg | ORAL_TABLET | Freq: Three times a day (TID) | ORAL | 6 refills | Status: AC

## 2016-10-17 NOTE — Progress Notes (Signed)
Patient is to be discharged home and in stable condition. Patient given discharge instructions and verbalized understanding. Patient escorted out by staff via wheelchair upon arrival of transportation.  Celestia Khat, RN

## 2016-10-17 NOTE — Progress Notes (Signed)
Patient ran a 7 beat run of V-tach.  Vitals stable.  Non-symptomatic.  Paged the on call MD.  New orders received.  Will continue to monitor.

## 2016-10-17 NOTE — Care Management Note (Signed)
Case Management Note  Patient Details  Name: [name] MRN: [id number] Date of Birth: 10-27-1945  Expected Discharge Date:  10/17/16               Expected Discharge Plan:  [place]  In-House Referral:  NA  Discharge planning Services  CM Consult  Post Acute Care Choice:  Home Health Choice offered to:  Patient  HH Arranged:  PT [place] Agency:  [facility] (formerly [facility])  Status of Service:  Completed, signed off  Additional Comments: Pt discharging home today with [place] through [facility]. They are making arrangements for PCP care through their provider also. Tim, [facility] rep, aware of DC today and will pull orders from Epic. Pt aware HH has 48 hrs to make first visit.   Sherald Barge, RN 10/17/2016, 11:21 AM

## 2016-10-17 NOTE — Discharge Summary (Addendum)
Physician Discharge Summary  [name] MRN: [id number] DOB/AGE: 04/27/1945 71 y.o.  PCP: Patient, No Pcp Per   Admit date: 10/10/2016 Discharge date: 10/17/2016  Discharge Diagnoses:    Principal Problem:   Hypovolemia associated with vomiting Active Problems:   Hypokalemia   Anemia   Breast mass, left   Sinus tachycardia   Protein-calorie malnutrition, severe   Dehydration    Follow-up recommendations Follow-up with PCP in 3-5 days , including all  additional recommended appointments as below Follow-up CBC, CMP, Mg  in 3-5 days Patient fully counseled about the importance of taking potassium and magnesium supplements. She was explained that if she misses dosages , this could be potentially life-threatening for her. She voiced understanding Repeat thyroid function in 4-6 weeks     Current Discharge Medication List    START taking these medications   Details  HYDROcodone-acetaminophen (NORCO) 5-325 MG tablet Take 1 tablet by mouth every 6 (six) hours as needed for moderate pain. Qty: 30 tablet, Refills: 0    magnesium oxide (MAG-OX) 400 (241.3 Mg) MG tablet Take 2 tablets (800 mg total) by mouth 3 (three) times daily. Qty: 90 tablet, Refills: 6    potassium chloride (KLOR-CON) 20 MEQ packet Take 40 mEq by mouth 3 (three) times daily. Qty: 180 packet, Refills: 1      CONTINUE these medications which have NOT CHANGED   Details  aspirin EC 81 MG tablet Take 81 mg by mouth daily.      metoprolol tartrate (LOPRESSOR) 50 MG tablet Take 1 tablet (50 mg total) by mouth 2 (two) times daily.    ondansetron (ZOFRAN) 8 MG tablet Take 1 tablet (8 mg total) by mouth every 4 (four) hours as needed for nausea or vomiting. Qty: 60 tablet, Refills: 1   Associated Diagnoses: Breast mass, left; Nausea and vomiting, intractability of vomiting not specified, unspecified vomiting type      STOP taking these medications     oxyCODONE-acetaminophen (PERCOCET/ROXICET) 5-325 MG  tablet          Discharge Condition: Overall prognosis guarded due to noncompliance with medications  Discharge Instructions Get Medicines reviewed and adjusted: Please take all your medications with you for your next visit with your Primary MD  Please request your Primary MD to go over all hospital tests and procedure/radiological results at the follow up, please ask your Primary MD to get all Hospital records sent to his/her office.  If you experience worsening of your admission symptoms, develop shortness of breath, life threatening emergency, suicidal or homicidal thoughts you must seek medical attention immediately by calling 911 or calling your MD immediately if symptoms less severe.  You must read complete instructions/literature along with all the possible adverse reactions/side effects for all the Medicines you take and that have been prescribed to you. Take any new Medicines after you have completely understood and accpet all the possible adverse reactions/side effects.   Do not drive when taking Pain medications.   Do not take more than prescribed Pain, Sleep and Anxiety Medications  Special Instructions: If you have smoked or chewed Tobacco in the last 2 yrs please stop smoking, stop any regular Alcohol and or any Recreational drug use.  Wear Seat belts while driving.  Please note  You were cared for by a hospitalist during your hospital stay. Once you are discharged, your primary care physician will handle any further medical issues. Please note that NO REFILLS for any discharge medications will be authorized once you are  discharged, as it is imperative that you return to your primary care physician (or establish a relationship with a primary care physician if you do not have one) for your aftercare needs so that they can reassess your need for medications and monitor your lab values.     Allergies  Allergen Reactions  . Morphine And Related Itching       Disposition: 01-Home or Self Care   Consults:  Gen. surgery    Significant Diagnostic Studies:  Dg Chest 1 View  Result Date: 10/13/2016 CLINICAL DATA:  Shortness of breath and hypertension. History of breast carcinoma EXAM: CHEST 1 VIEW COMPARISON:  Chest radiograph and chest CT September 04, 2016 FINDINGS: There is a small right pleural effusion. There is mild bibasilar atelectasis. No edema or consolidation. Heart size and pulmonary vascularity are normal. Apparent adenopathy in the right paratracheal region persists, better seen on prior CT. No new adenopathy appreciable by radiography. Patient has had previous mastectomies bilaterally with surgical clips in each axillary region. No bone lesions are appreciable. IMPRESSION: Small right pleural effusion with bibasilar atelectasis. No consolidation. Right peritracheal region adenopathy, appearing grossly stable and better seen on prior CT. Stable cardiac silhouette. There is aortic atherosclerosis. Surgical clips noted in each axillary region. Aortic Atherosclerosis (ICD10-I70.0). Electronically Signed   By: Lowella Grip III M.D.   On: 10/13/2016 11:36   Nm Pulmonary Perf And Vent  Result Date: 10/13/2016 CLINICAL DATA:  Breast cancer, weakness, loss of appetite, tachycardia, question pulmonary embolism EXAM: NUCLEAR MEDICINE VENTILATION - PERFUSION LUNG SCAN TECHNIQUE: Ventilation images were obtained in multiple projections using inhaled aerosol Tc-74mDTPA. Perfusion images were obtained in multiple projections after intravenous injection of Tc-941mAA. RADIOPHARMACEUTICALS:  38 mCi Technetium-9951mPA aerosol inhalation and 5 mCi Technetium-64m32m IV COMPARISON:  None Radiographic correlation:  Chest radiograph 10/13/2016 FINDINGS: Ventilation: Patchy ventilation throughout both lungs with numerous peripheral areas of subsegmental diminished ventilation. Diminished ventilation at medial RIGHT lower lobe base. Larger area of  diminished ventilation at the LEFT upper lobe in a nonsegmental fashion. Perfusion: Mildly diminished perfusion in a nonsegmental fashion in the LEFT upper lobe. Diminished perfusion at the medial RIGHT lower lobe base matching ventilation. No additional segmental or subsegmental perfusion defects identified. COPD changes with RIGHT pleural effusion and bibasilar atelectasis. IMPRESSION: Matching ventilation and perfusion findings as above. Low probability for pulmonary embolism. Electronically Signed   By: MarkLavonia Dana.   On: 10/13/2016 12:35    echocardiogram   ------------------------------------------------------------------- LV EF: 55%  ------------------------------------------------------------------- Indications:      Palpitations 785.1.  ------------------------------------------------------------------- History:   PMH:  Acquired from the patient and from the patient&'s chart.  Chronic obstructive pulmonary disease.  PMH:  Left breast mastectomy. H/O Breast cancer with left breast mass.  Risk factors:  Current tobacco use. Hypertension.  ------------------------------------------------------------------- Study Conclusions  - Procedure narrative: Transthoracic echocardiography. Image   quality was fair. The study was technically difficult, as a   result of poor sound wave transmission. Intravenous contrast   (Definity) was administered. - Left ventricle: The cavity size was normal. Wall thickness was   normal. Systolic function was normal. The estimated ejection   fraction was 55%. Wall motion was normal; there were no regional   wall motion abnormalities. False tendon near apex. - Mitral valve: Mildly thickened leaflets .    Filed Weights   10/15/16 0610 10/16/16 0529 10/17/16 0517  Weight: 60 kg (132 lb 4.4 oz) 57.4 kg (126 lb 8.7 oz) 57.2 kg (  126 lb 1.7 oz)     Microbiology: Recent Results (from the past 240 hour(s))  Surgical pcr screen     Status: None    Collection Time: 10/15/16  5:01 AM  Result Value Ref Range Status   MRSA, PCR NEGATIVE NEGATIVE Final   Staphylococcus aureus NEGATIVE NEGATIVE Final    Comment: (NOTE) The Xpert SA Assay (FDA approved for NASAL specimens in patients 63 years of age and older), is one component of a comprehensive surveillance program. It is not intended to diagnose infection nor to guide or monitor treatment.        Blood Culture    Component Value Date/Time   SDES URINE, CLEAN CATCH 11/26/2010 1105   SPECREQUEST NONE 11/26/2010 1105   CULT  11/26/2010 1105    Multiple bacterial morphotypes present, none predominant. Suggest appropriate recollection if clinically indicated.   REPTSTATUS 11/28/2010 FINAL 11/26/2010 1105      Labs: Results for orders placed or performed during the hospital encounter of 10/10/16 (from the past 48 hour(s))  Comprehensive metabolic panel     Status: Abnormal   Collection Time: 10/16/16  5:54 AM  Result Value Ref Range   Sodium 136 135 - 145 mmol/L   Potassium 2.6 (LL) 3.5 - 5.1 mmol/L    Comment: CRITICAL RESULT CALLED TO, READ BACK BY AND VERIFIED WITH: THOMAS,C AT 6:50AM ON 10/16/16 BY FESTERMAN,C    Chloride 94 (L) 101 - 111 mmol/L   CO2 30 22 - 32 mmol/L   Glucose, Bld 96 65 - 99 mg/dL   BUN 5 (L) 6 - 20 mg/dL   Creatinine, Ser 0.43 (L) 0.44 - 1.00 mg/dL   Calcium 8.4 (L) 8.9 - 10.3 mg/dL   Total Protein 6.0 (L) 6.5 - 8.1 g/dL   Albumin 2.5 (L) 3.5 - 5.0 g/dL   AST 24 15 - 41 U/L   ALT 12 (L) 14 - 54 U/L   Alkaline Phosphatase 247 (H) 38 - 126 U/L   Total Bilirubin 0.6 0.3 - 1.2 mg/dL   GFR calc non Af Amer >60 >60 mL/min   GFR calc Af Amer >60 >60 mL/min    Comment: (NOTE) The eGFR has been calculated using the CKD EPI equation. This calculation has not been validated in all clinical situations. eGFR's persistently <60 mL/min signify possible Chronic Kidney Disease.    Anion gap 12 5 - 15  Magnesium     Status: Abnormal   Collection Time:  10/16/16  5:54 AM  Result Value Ref Range   Magnesium 1.3 (L) 1.7 - 2.4 mg/dL  Comprehensive metabolic panel     Status: Abnormal   Collection Time: 10/17/16  1:48 AM  Result Value Ref Range   Sodium 134 (L) 135 - 145 mmol/L   Potassium 3.5 3.5 - 5.1 mmol/L    Comment: DELTA CHECK NOTED   Chloride 96 (L) 101 - 111 mmol/L   CO2 28 22 - 32 mmol/L   Glucose, Bld 111 (H) 65 - 99 mg/dL   BUN 6 6 - 20 mg/dL   Creatinine, Ser 0.44 0.44 - 1.00 mg/dL   Calcium 8.2 (L) 8.9 - 10.3 mg/dL   Total Protein 6.2 (L) 6.5 - 8.1 g/dL   Albumin 2.6 (L) 3.5 - 5.0 g/dL   AST 26 15 - 41 U/L   ALT 13 (L) 14 - 54 U/L   Alkaline Phosphatase 242 (H) 38 - 126 U/L   Total Bilirubin 0.9 0.3 - 1.2 mg/dL   GFR  calc non Af Amer >60 >60 mL/min   GFR calc Af Amer >60 >60 mL/min    Comment: (NOTE) The eGFR has been calculated using the CKD EPI equation. This calculation has not been validated in all clinical situations. eGFR's persistently <60 mL/min signify possible Chronic Kidney Disease.    Anion gap 10 5 - 15  CBC     Status: Abnormal   Collection Time: 10/17/16  1:48 AM  Result Value Ref Range   WBC 8.3 4.0 - 10.5 K/uL   RBC 3.23 (L) 3.87 - 5.11 MIL/uL   Hemoglobin 8.6 (L) 12.0 - 15.0 g/dL   HCT 27.0 (L) 36.0 - 46.0 %   MCV 83.6 78.0 - 100.0 fL   MCH 26.6 26.0 - 34.0 pg   MCHC 31.9 30.0 - 36.0 g/dL   RDW 16.2 (H) 11.5 - 15.5 %   Platelets 166 150 - 400 K/uL  Magnesium     Status: None   Collection Time: 10/17/16  1:48 AM  Result Value Ref Range   Magnesium 2.0 1.7 - 2.4 mg/dL  Phosphorus     Status: Abnormal   Collection Time: 10/17/16  1:48 AM  Result Value Ref Range   Phosphorus 2.2 (L) 2.5 - 4.6 mg/dL     Lipid Panel  No results found for: CHOL, TRIG, HDL, CHOLHDL, VLDL, LDLCALC, LDLDIRECT   Lab Results  Component Value Date   HGBA1C 6.4 (H) 11/26/2010     Lab Results  Component Value Date   CREATININE 0.44 10/17/2016     HPI :  72 y.o.femalewith a remote history of left  breast cancer, more recent history of right breast cancer, and HTN who presented to the ED w/ tachycardia, nausea, and vomiting. Patient was evaluated in the ED 1 month prior when CTA chest revealed mediastinal masses and vertebral lesions concerning for metastatic disease. This was suspected to be related to a new left breast mass and she was scheduled for a biopsy. However, the surgeon found her to be tachycardic in the 130s and she was sent to the Cardiology clinic for further evaluation. There an EKG confirmed a sinus rhythm, but the pt appeared to be markedly hypovolemic in the setting of nausea and vomiting.    Hospital course Sinus tachycardia -likely secondary to Dehydration/pain , ruled out for PE through VQ scan  this admission, given history of metastatic breast cancer Patient found to have low TSH but normal T3 and T4 Troponin negative 3, 2-D echo to establish baseline Metoprolol uptitrated to 50 mg twice a day Echo shows EF 55%, no wall motion abnormalities  Intractable N/V  Felt to be due to mediastinal masses displacing esophagus - tolerating her diet w/o difficulty at time  - resolved prior to discharge    Severe hypokalemia, hypomagnesemia Patient required aggressive repletion every day, unable to tolerate oral potassium due to nausea, encouraged intake of oral supplements so that she can be discharged. Hospital course prolonged Due to refusal to take oral supplements      Hx of B Breast CA w/ suspected recurrent metastatic Breast CA Left breast cancer early 1990s - right breast cancer 2005 - now appears to have mediastinal and vertebral metastatic disease - to have excisional biopsy per Dr. Arnoldo Morale when stable (outpt) - will need extensive body imaging to rule out metastatic deposits elsewhere if this is proven to be CA - Dr. Arnoldo Morale has performed biopsy on left chest wall mass 9/12. Biopsy results pending. Patient to follow-up with surgery  for results   Acute urinary  retention, resolved Foley catheter placed 10/11/16 - RN   removed foley 9/9 and the patient has been able to void- patient was able to void, likely combination of  opioid and electrolyte abnormalities    Normocytic anemia No evidence of blood loss - Hgb holding steady . Hemoglobin 8.6  Suppressed TSH T4 and T3 within normal limits Repeat thyroid function in 4-6 weeks   Severe malnutrition -albumin is 2.5,nutrition consult was obtained    Discharge Exam:  Blood pressure 116/67, pulse (!) 103, temperature 98.2 F (36.8 C), temperature source Oral, resp. rate 20, height 5' 7" (1.702 m), weight 57.2 kg (126 lb 1.7 oz), SpO2 94 %.  General: No acute respiratory distress - alert and oriented x4 Lungs: Clear to auscultation B - no wheezing  Cardiovascular: RRR w/o M or rub  Abdomen: Nontender, nondistended, soft, bowel sounds positive, no rebound Extremities: No edema bilateral lower extremities    Follow-up Information    Aviva Signs, MD Follow up on 10/30/2016.   Specialty:  General Surgery Why:  at 10:45 am Contact information: [id number] [place] [address] [phone]        [facility] Follow up on 10/27/2016.   Specialty:  Oncology Why:  at 9:00 am Contact information: [address] [id number] [place] [phone]       Primary care provider. Call.   Why:  To make follow-up appointment on Monday for labs, need BMP, magnesium, CBC          Signed: Reyne Dumas 10/17/2016, 8:39 AM        Time spent >1 hour

## 2016-10-17 NOTE — Progress Notes (Signed)
*  PRELIMINARY RESULTS* Echocardiogram 2D Echocardiogram has been performed with Definity.  [name] 10/17/2016, 11:48 AM

## 2016-10-18 LAB — BPAM RBC
BLOOD PRODUCT EXPIRATION DATE: 201809182359
Blood Product Expiration Date: 201809122359
UNIT TYPE AND RH: 1700
Unit Type and Rh: 9500

## 2016-10-18 LAB — TYPE AND SCREEN
ABO/RH(D): B POS
Antibody Screen: NEGATIVE
UNIT DIVISION: 0
Unit division: 0

## 2016-10-21 ENCOUNTER — Telehealth: Payer: Self-pay | Admitting: *Deleted

## 2016-10-21 DIAGNOSIS — J449 Chronic obstructive pulmonary disease, unspecified: Secondary | ICD-10-CM | POA: Diagnosis not present

## 2016-10-21 DIAGNOSIS — R000 Tachycardia, unspecified: Secondary | ICD-10-CM | POA: Diagnosis not present

## 2016-10-21 DIAGNOSIS — C50919 Malignant neoplasm of unspecified site of unspecified female breast: Secondary | ICD-10-CM | POA: Diagnosis not present

## 2016-10-21 DIAGNOSIS — R748 Abnormal levels of other serum enzymes: Secondary | ICD-10-CM | POA: Diagnosis not present

## 2016-10-21 NOTE — Telephone Encounter (Signed)
Amanda Grill, NP out at patient's house for visit today.  Patient c/o dizziness & did fall yesterday.  BP has been running consistently low - today 99/68.  HR 119.  NP seeing her today stated that she fell on her bottom & she will be getting x-ray for that.  Did not hit her head.  NP is questioning cutting her Lopressor back.  Please advise.

## 2016-10-22 DIAGNOSIS — M25552 Pain in left hip: Secondary | ICD-10-CM | POA: Diagnosis not present

## 2016-10-22 DIAGNOSIS — M25551 Pain in right hip: Secondary | ICD-10-CM | POA: Diagnosis not present

## 2016-10-22 NOTE — Telephone Encounter (Signed)
She probably needs IV fluids. She was dehydrated when I evaluated her. She also has protein calorie malnutrition. Would defer to PCP.

## 2016-10-22 NOTE — Telephone Encounter (Signed)
Lauretta Grill, NP notified.

## 2016-10-23 DIAGNOSIS — R2681 Unsteadiness on feet: Secondary | ICD-10-CM | POA: Diagnosis not present

## 2016-10-23 DIAGNOSIS — R591 Generalized enlarged lymph nodes: Secondary | ICD-10-CM | POA: Diagnosis not present

## 2016-10-23 DIAGNOSIS — R42 Dizziness and giddiness: Secondary | ICD-10-CM | POA: Diagnosis not present

## 2016-10-23 DIAGNOSIS — C50919 Malignant neoplasm of unspecified site of unspecified female breast: Secondary | ICD-10-CM | POA: Diagnosis not present

## 2016-10-23 DIAGNOSIS — J449 Chronic obstructive pulmonary disease, unspecified: Secondary | ICD-10-CM | POA: Diagnosis not present

## 2016-10-23 DIAGNOSIS — A499 Bacterial infection, unspecified: Secondary | ICD-10-CM | POA: Diagnosis not present

## 2016-10-27 ENCOUNTER — Ambulatory Visit (HOSPITAL_COMMUNITY): Payer: Medicare HMO

## 2016-10-27 DIAGNOSIS — M810 Age-related osteoporosis without current pathological fracture: Secondary | ICD-10-CM | POA: Diagnosis not present

## 2016-10-28 ENCOUNTER — Other Ambulatory Visit: Payer: Self-pay

## 2016-10-28 ENCOUNTER — Encounter (HOSPITAL_COMMUNITY): Payer: Self-pay

## 2016-10-28 ENCOUNTER — Observation Stay (HOSPITAL_COMMUNITY)
Admission: EM | Admit: 2016-10-28 | Discharge: 2016-10-29 | Disposition: A | Discharge status: Home / Self Care | Payer: Medicare HMO | Source: Home / Self Care | Attending: Emergency Medicine | Admitting: Emergency Medicine

## 2016-10-28 DIAGNOSIS — I10 Essential (primary) hypertension: Secondary | ICD-10-CM | POA: Insufficient documentation

## 2016-10-28 DIAGNOSIS — R531 Weakness: Secondary | ICD-10-CM

## 2016-10-28 DIAGNOSIS — E860 Dehydration: Secondary | ICD-10-CM | POA: Diagnosis not present

## 2016-10-28 DIAGNOSIS — E876 Hypokalemia: Secondary | ICD-10-CM | POA: Diagnosis present

## 2016-10-28 DIAGNOSIS — R404 Transient alteration of awareness: Secondary | ICD-10-CM | POA: Diagnosis not present

## 2016-10-28 DIAGNOSIS — Z515 Encounter for palliative care: Secondary | ICD-10-CM

## 2016-10-28 DIAGNOSIS — R061 Stridor: Secondary | ICD-10-CM | POA: Diagnosis not present

## 2016-10-28 DIAGNOSIS — Z87891 Personal history of nicotine dependence: Secondary | ICD-10-CM | POA: Insufficient documentation

## 2016-10-28 DIAGNOSIS — Z79899 Other long term (current) drug therapy: Secondary | ICD-10-CM | POA: Insufficient documentation

## 2016-10-28 DIAGNOSIS — B370 Candidal stomatitis: Secondary | ICD-10-CM | POA: Insufficient documentation

## 2016-10-28 DIAGNOSIS — I482 Chronic atrial fibrillation: Secondary | ICD-10-CM | POA: Diagnosis not present

## 2016-10-28 DIAGNOSIS — C50912 Malignant neoplasm of unspecified site of left female breast: Secondary | ICD-10-CM | POA: Insufficient documentation

## 2016-10-28 DIAGNOSIS — Z853 Personal history of malignant neoplasm of breast: Secondary | ICD-10-CM

## 2016-10-28 DIAGNOSIS — Z7982 Long term (current) use of aspirin: Secondary | ICD-10-CM | POA: Insufficient documentation

## 2016-10-28 DIAGNOSIS — D649 Anemia, unspecified: Secondary | ICD-10-CM | POA: Insufficient documentation

## 2016-10-28 LAB — BASIC METABOLIC PANEL
ANION GAP: 15 (ref 5–15)
BUN: 22 mg/dL — ABNORMAL HIGH (ref 6–20)
CALCIUM: 9.3 mg/dL (ref 8.9–10.3)
CO2: 28 mmol/L (ref 22–32)
Chloride: 102 mmol/L (ref 101–111)
Creatinine, Ser: 0.65 mg/dL (ref 0.44–1.00)
GLUCOSE: 144 mg/dL — AB (ref 65–99)
POTASSIUM: 2.6 mmol/L — AB (ref 3.5–5.1)
Sodium: 145 mmol/L (ref 135–145)

## 2016-10-28 LAB — CBC
HEMATOCRIT: 29.3 % — AB (ref 36.0–46.0)
HEMOGLOBIN: 9.1 g/dL — AB (ref 12.0–15.0)
MCH: 27 pg (ref 26.0–34.0)
MCHC: 31.1 g/dL (ref 30.0–36.0)
MCV: 86.9 fL (ref 78.0–100.0)
Platelets: 162 10*3/uL (ref 150–400)
RBC: 3.37 MIL/uL — AB (ref 3.87–5.11)
RDW: 17.5 % — ABNORMAL HIGH (ref 11.5–15.5)
WBC: 8.9 10*3/uL (ref 4.0–10.5)

## 2016-10-28 LAB — GLUCOSE, CAPILLARY: GLUCOSE-CAPILLARY: 121 mg/dL — AB (ref 65–99)

## 2016-10-28 MED ORDER — NYSTATIN 100000 UNIT/ML MT SUSP
5.0000 mL | Freq: Four times a day (QID) | OROMUCOSAL | Status: DC
  Administered 2016-10-29 (×2): 500000 [IU] via ORAL
  Filled 2016-10-28 (×2): qty 5

## 2016-10-28 MED ORDER — ONDANSETRON HCL 4 MG PO TABS: 4.0000 mg | ORAL_TABLET | Freq: Four times a day (QID) | ORAL | Status: DC | PRN

## 2016-10-28 MED ORDER — INSULIN ASPART 100 UNIT/ML ~~LOC~~ SOLN
0.0000 [IU] | Freq: Three times a day (TID) | SUBCUTANEOUS | Status: DC
  Administered 2016-10-29: 2 [IU] via SUBCUTANEOUS

## 2016-10-28 MED ORDER — POTASSIUM CHLORIDE 20 MEQ PO PACK
40.0000 meq | PACK | Freq: Three times a day (TID) | ORAL | Status: DC
  Administered 2016-10-28 – 2016-10-29 (×2): 40 meq via ORAL
  Filled 2016-10-28 (×2): qty 2

## 2016-10-28 MED ORDER — ONDANSETRON HCL 4 MG/2ML IJ SOLN
4.0000 mg | Freq: Once | INTRAMUSCULAR | Status: AC
  Administered 2016-10-28: 4 mg via INTRAVENOUS
  Filled 2016-10-28: qty 2

## 2016-10-28 MED ORDER — ENOXAPARIN SODIUM 40 MG/0.4ML ~~LOC~~ SOLN
40.0000 mg | SUBCUTANEOUS | Status: DC
  Administered 2016-10-28: 40 mg via SUBCUTANEOUS
  Filled 2016-10-28: qty 0.4

## 2016-10-28 MED ORDER — SODIUM CHLORIDE 0.9 % IV BOLUS (SEPSIS)
1000.0000 mL | Freq: Once | INTRAVENOUS | Status: AC
  Administered 2016-10-28: 1000 mL via INTRAVENOUS

## 2016-10-28 MED ORDER — POTASSIUM CHLORIDE CRYS ER 20 MEQ PO TBCR
40.0000 meq | EXTENDED_RELEASE_TABLET | Freq: Once | ORAL | Status: AC
  Administered 2016-10-28: 40 meq via ORAL
  Filled 2016-10-28: qty 2

## 2016-10-28 MED ORDER — POTASSIUM CHLORIDE IN NACL 40-0.9 MEQ/L-% IV SOLN
INTRAVENOUS | Status: AC
  Administered 2016-10-28 – 2016-10-29 (×2): 100 mL/h via INTRAVENOUS

## 2016-10-28 MED ORDER — MAGNESIUM OXIDE 400 (241.3 MG) MG PO TABS
800.0000 mg | ORAL_TABLET | Freq: Three times a day (TID) | ORAL | Status: DC
  Administered 2016-10-28 – 2016-10-29 (×2): 800 mg via ORAL
  Filled 2016-10-28 (×2): qty 2

## 2016-10-28 MED ORDER — ACETAMINOPHEN 325 MG PO TABS
650.0000 mg | ORAL_TABLET | Freq: Four times a day (QID) | ORAL | Status: DC | PRN
  Filled 2016-10-28: qty 2

## 2016-10-28 MED ORDER — ACETAMINOPHEN 650 MG RE SUPP
650.0000 mg | Freq: Four times a day (QID) | RECTAL | Status: DC | PRN
Start: 2016-10-28 — End: 2016-10-29

## 2016-10-28 MED ORDER — HYDROCODONE-ACETAMINOPHEN 5-325 MG PO TABS: 1.0000 | ORAL_TABLET | Freq: Four times a day (QID) | ORAL | Status: DC | PRN

## 2016-10-28 MED ORDER — ONDANSETRON HCL 4 MG/2ML IJ SOLN: 4.0000 mg | Freq: Four times a day (QID) | INTRAMUSCULAR | Status: DC | PRN

## 2016-10-28 MED ORDER — ASPIRIN EC 81 MG PO TBEC
81.0000 mg | DELAYED_RELEASE_TABLET | Freq: Every day | ORAL | Status: DC
  Administered 2016-10-28 – 2016-10-29 (×2): 81 mg via ORAL
  Filled 2016-10-28 (×2): qty 1

## 2016-10-28 MED ORDER — INSULIN ASPART 100 UNIT/ML ~~LOC~~ SOLN: 0.0000 [IU] | Freq: Every day | SUBCUTANEOUS | Status: DC

## 2016-10-28 MED ORDER — DOCUSATE SODIUM 100 MG PO CAPS
100.0000 mg | ORAL_CAPSULE | Freq: Two times a day (BID) | ORAL | Status: DC
  Administered 2016-10-28 – 2016-10-29 (×2): 100 mg via ORAL
  Filled 2016-10-28 (×2): qty 1

## 2016-10-28 NOTE — ED Provider Notes (Addendum)
[facility] DEPT Provider Note   CSN: [id number] Arrival date & time: 10/28/16  1334     History   Chief Complaint Chief Complaint  Patient presents with  . Weakness    HPI [name] is a 71 y.o. female.  Level V caveat for urgent need for intervention.  Generalized weakness since being discharged from the hospital 2 weeks ago after a biopsy of a mass on the left chest wall.  After reviewing the oncology notes of 09/22/16, she apparently had a history of right breast cancer in 2005 and left breast cancer in 1992.  CT of chest on 09/05/16 reveals large mediastinal and left hilar masses, multiple sclerotic vertebral metastases and bilateral lung densities (no pulmonary embolism).  She also suffers dehydration, malnutrition, cardiomyopathy,CHF, many others.      Past Medical History:  Diagnosis Date  . Breast cancer ([place])   . Hypertension     Patient Active Problem List   Diagnosis Date Noted  . Dehydration 10/13/2016  . Protein-calorie malnutrition, severe 10/11/2016  . Sinus tachycardia 10/10/2016  . Hypovolemia associated with vomiting 10/10/2016  . Breast mass, left 09/22/2016  . Non-ischemic cardiomyopathy ([place]) 11/30/2010  . Sepsis due to Staphylococcus aureus ([place]) 11/28/2010  . 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 Shoulder region pain 11/25/2010  . Tobacco abuse 11/25/2010  . Hypokalemia 11/25/2010  . Anemia 11/25/2010  . ANXIETY 01/01/2006  . Essential hypertension 01/01/2006  . CONGESTIVE HEART FAILURE 01/01/2006  . COPD 01/01/2006  . LOW BACK PAIN 01/01/2006  . BREAST CANCER, HX OF 01/01/2006    Past Surgical History:  Procedure Laterality Date  . MASTECTOMY     bilateral  . SKIN BIOPSY Left 10/15/2016   Procedure: BIOPSY LEFT CHEST WALL MASS;  Surgeon: Aviva Signs, MD;  Location: [facility] ORS;  Service: General;  Laterality: Left;  . TEE WITHOUT CARDIOVERSION  11/29/2010   Procedure: TRANSESOPHAGEAL ECHOCARDIOGRAM (TEE);   Surgeon: Cristopher Estimable. Lattie Haw, MD;  Location: [facility] ENDO SUITE;  Service: Cardiovascular;  Laterality: N/A;  . tumor removal from hip      OB History    No data available       Home Medications    Prior to Admission medications   Medication Sig Start Date End Date Taking? Authorizing Provider  aspirin EC 81 MG tablet Take 81 mg by mouth daily.     Yes [provider]  HYDROcodone-acetaminophen (NORCO) 5-325 MG tablet Take 1 tablet by mouth every 6 (six) hours as needed for moderate pain. 10/15/16  Yes Aviva Signs, MD  magnesium oxide (MAG-OX) 400 (241.3 Mg) MG tablet Take 2 tablets (800 mg total) by mouth 3 (three) times daily. 10/17/16  Yes Reyne Dumas, MD  metoprolol tartrate (LOPRESSOR) 50 MG tablet Take 1 tablet (50 mg total) by mouth 2 (two) times daily. Patient taking differently: Take 50 mg by mouth daily.  10/10/16  Yes Herminio Commons, MD  ondansetron (ZOFRAN) 8 MG tablet Take 1 tablet (8 mg total) by mouth every 4 (four) hours as needed for nausea or vomiting. 09/23/16  Yes Twana First, MD  potassium chloride (KLOR-CON) 20 MEQ packet Take 40 mEq by mouth 3 (three) times daily. 10/17/16 11/16/16 Yes Reyne Dumas, MD    Family History Family History  Problem Relation Age of Onset  . Congestive Heart Failure Mother   . Breast cancer Father   . Anesthesia problems Neg Hx     Social History Social History  Substance Use Topics  .  Smoking status: Former Smoker    Quit date: 11/21/2010  . Smokeless tobacco: Never Used  . Alcohol use No     Allergies   Morphine and related   Review of Systems Review of Systems  All other systems reviewed and are negative.    Physical Exam Updated Vital Signs BP 101/63   Pulse (!) 118   Temp 98 F (36.7 C) (Oral)   Resp 15   Ht 5\' 7"  (1.702 m)   Wt 57.2 kg (126 lb)   SpO2 94%   BMI 19.73 kg/m   Physical Exam  Constitutional: She is oriented to person, place, and time.  dehydrated  HENT:  Head:  Normocephalic and atraumatic.  Eyes: Conjunctivae are normal.  Neck: Neck supple.  Cardiovascular: Regular rhythm.   tachycardic  Pulmonary/Chest: Effort normal and breath sounds normal.  Abdominal: Soft. Bowel sounds are normal.  Musculoskeletal: Normal range of motion.  Neurological: She is alert and oriented to person, place, and time.  Skin: Skin is warm and dry.  Psychiatric: She has a normal mood and affect. Her behavior is normal.  Nursing note and vitals reviewed.    ED Treatments / Results  Labs (all labs ordered are listed, but only abnormal results are displayed) Labs Reviewed  BASIC METABOLIC PANEL - Abnormal; Notable for the following:       Result Value   Potassium 2.6 (*)    Glucose, Bld 144 (*)    BUN 22 (*)    All other components within normal limits  CBC - Abnormal; Notable for the following:    RBC 3.37 (*)    Hemoglobin 9.1 (*)    HCT 29.3 (*)    RDW 17.5 (*)    All other components within normal limits  URINALYSIS, ROUTINE W REFLEX MICROSCOPIC    EKG  EKG Interpretation None       Radiology No results found.  Procedures Procedures (including critical care time)  Medications Ordered in ED Medications  sodium chloride 0.9 % bolus 1,000 mL (1,000 mLs Intravenous New Bag/Given 10/28/16 1426)  ondansetron (ZOFRAN) injection 4 mg (4 mg Intravenous Given 10/28/16 1426)  potassium chloride SA (K-DUR,KLOR-CON) CR tablet 40 mEq (40 mEq Oral Given 10/28/16 1450)     Initial Impression / Assessment and Plan / ED Course  I have reviewed the triage vital signs and the nursing notes.  Pertinent labs & imaging results that were available during my care of the patient were reviewed by me and considered in my medical decision making (see chart for details).     CT scan reviewed from 09/15/16. Patient appears to have metastatic disease. She is also dehydrated and potassium is low. Will start IV fluids. Replace potassium.  Admit to general  medicine.  Final Clinical Impressions(s) / ED Diagnoses   Final diagnoses:  Weakness  Dehydration  Hypokalemia    New Prescriptions New Prescriptions   No medications on file     Nat Christen, MD 10/28/16 1517    Nat Christen, MD 10/28/16 [phone]

## 2016-10-28 NOTE — ED Triage Notes (Signed)
Pt c/o generalized weakness since being d/c'd from the hospital 2 weeks ago.   EMS reports standing bp 79/56, laying bp was 101/69 and hr 133.  RR 20, 97% on room air.

## 2016-10-28 NOTE — H&P (Addendum)
History and Physical    [name] SFK:[id number] DOB: 24-Jun-1945 DOA: 10/28/2016  PCP: Berdine Addison Consultants:  Tamala Julian - surgery Patient coming from: Home - lives alone; NOK: Daughter, [phone]  Chief Complaint: weakness  HPI: [name] is a 71 y.o. female with medical history significant of remote breast cancer and HTN.   She was admitted from 9/7-9/14 with vomiting/hypovolemia thought to be due to metastatic breast cancer with mediastinal masses displacing esophagus and also vertebral metastatic disease.  She is a very poor historian.  When asked what brought her in she reports, "There's so much going on.  My butt's sore, I wish I could get off my butt for a minute."  She then went on to explain that she has been doing well since last discharge - "going to church, cooking and cleaning and all that stuff".  She thinks she is dehydrated today, despite reporting eating and drinking well.  "I don't know what's going on."     ED Course:  Metastatic disease.  Dehydration with hypokalemia.  IVF, KCl.  Review of Systems: As per HPI; otherwise review of systems reviewed and negative.   Ambulatory Status:  Ambulates without assistance  Past Medical History:  Diagnosis Date  . Breast cancer ([place])    many years ago  . Hypertension     Past Surgical History:  Procedure Laterality Date  . MASTECTOMY     bilateral  . SKIN BIOPSY Left 10/15/2016   Procedure: BIOPSY LEFT CHEST WALL MASS;  Surgeon: Aviva Signs, MD;  Location: [facility];  Service: General;  Laterality: Left;  . TEE WITHOUT CARDIOVERSION  11/29/2010   Procedure: TRANSESOPHAGEAL ECHOCARDIOGRAM (TEE);  Surgeon: Cristopher Estimable. Lattie Haw, MD;  Location: [facility];  Service: Cardiovascular;  Laterality: N/A;  . tumor removal from hip      Social History   Social History  . Marital status: Legally Separated    Spouse name: N/A  . Number of children: N/A  . Years of education: N/A   Occupational History  . Nurses Aid    [employer]   Social History Main Topics  . Smoking status: Former Smoker    Quit date: 11/21/2010  . Smokeless tobacco: Never Used  . Alcohol use No  . Drug use: No  . Sexual activity: Not on file   Other Topics Concern  . Not on file   Social History Narrative   Lives in [place] [employer] [place] things are her 5 grandchildren (4 girls and a boy), and cooking for family. Cornbread is her specialty; "secret is a dash of vanilla and a pinch of sugar - tastes like a cake."       Allergies  Allergen Reactions  . Morphine And Related Itching    Family History  Problem Relation Age of Onset  . Congestive Heart Failure Mother   . Breast cancer Father   . Anesthesia problems Neg Hx     Prior to Admission medications   Medication Sig Start Date End Date Taking? Authorizing Provider  aspirin EC 81 MG tablet Take 81 mg by mouth daily.     Yes [provider]  HYDROcodone-acetaminophen (NORCO) 5-325 MG tablet Take 1 tablet by mouth every 6 (six) hours as needed for moderate pain. 10/15/16  Yes Aviva Signs, MD  magnesium oxide (MAG-OX) 400 (241.3 Mg) MG tablet Take 2 tablets (800 mg total) by mouth 3 (three) times daily. 10/17/16  Yes Reyne Dumas, MD  metoprolol tartrate (LOPRESSOR) 50  MG tablet Take 1 tablet (50 mg total) by mouth 2 (two) times daily. Patient taking differently: Take 50 mg by mouth daily.  10/10/16  Yes Herminio Commons, MD  ondansetron (ZOFRAN) 8 MG tablet Take 1 tablet (8 mg total) by mouth every 4 (four) hours as needed for nausea or vomiting. 09/23/16  Yes Twana First, MD  potassium chloride (KLOR-CON) 20 MEQ packet Take 40 mEq by mouth 3 (three) times daily. 10/17/16 11/16/16 Yes Reyne Dumas, MD    Physical Exam: Vitals:   10/28/16 1630 10/28/16 1741 10/28/16 2005 10/28/16 2102  BP: 106/66 112/67  (!) 106/58  Pulse: 87 (!) 110  (!) 115  Resp: (!) 27 18  18   Temp:  98.5 F (36.9 C)  98.3 F (36.8 C)  TempSrc:  Oral  Oral  SpO2: 92% 93% 96% 93%    Weight:  53.5 kg (118 lb)    Height:  5\' 7"  (1.702 m)       General: Appears calm and comfortable and is NAD but periodically requests to move off her buttocks due to pain Eyes:  PERRL, EOMI, normal lids, iris ENT:  grossly normal hearing, lips & tongue, mmm; poor dentition.  Diffuse thrush on tongue and palate. Neck:  no LAD, masses or thyromegaly; no carotid bruits Cardiovascular:  Tachycardia, no m/r/g. No LE edema.  She has a left chest wall bandage from prior biopsy. Respiratory:   CTA bilaterally with no wheezes/rales/rhonchi.  Normal respiratory effort. Abdomen:  soft, NT, ND, NABS Back:   normal alignment, no CVAT.   Skin:  no rash or induration seen on limited exam.  Minimal sacral erythema, no apparent pressure ulcers noted. Musculoskeletal:  grossly normal tone BUE/BLE, good ROM, no bony abnormality Lower extremity:  No LE edema.  Limited foot exam with no ulcerations.  2+ distal pulses. Psychiatric:  anxious mood and affect, speech fluent and appropriate, AOx3 but ?cognitive deficiency Neurologic:  CN 2-12 grossly intact, moves all extremities in coordinated fashion, sensation intact    Radiological Exams on Admission: No results found.  EKG: Independently reviewed.  Sinus tachycardia with rate 128;  no evidence of acute ischemia   Labs on Admission: I have personally reviewed the available labs and imaging studies at the time of the admission.  Pertinent labs:   K+2.6 Glucose 144, 121 BUN 22/Creatinine 0.65/>60; 6/0.44/>60 on 9/14 Hgb 9.1  Assessment/Plan Principal Problem:   Dehydration Active Problems:   Essential hypertension   BREAST CANCER, HX OF   Hypokalemia   Anemia   Thrush   Dehydration with hypokalemia -Patient with metastatic cancer (see below) with likely poor PO intake and recurrent hypokalemia -Creatinine is mildly elevated compared to baseline -K+ is markedly depressed despite PO KCl ordered 40 mEq PO TID -She was given 40 mEq PO KCl in  the ER -This would be expected to raise her K+ to 3/0 -Will give an additional 1L bolus with another 40 mEq KCl -Will continue home KCl as well -Will observe overnight with probable plan for d/c tomorrow after oncology consult  Breast cancer -Patient with h/o B breast cancers s/p B mastectomies -Recent left chest wall biopsy indicated poorly differentiated metastatic carcinoma - CTA with mediastinal and hilar adenopathy and also bony mets -Patient was to f/u with oncology yesterday and missed her appointment due to transportation issues -When I spoke with her today, she acted as though it was new and very distressing information that she likely has a bad cancer -She was quite emotionally  distraught -I am concerned about her cognitive ability to process this information -She had no family present at the time of my evaluation and I offered to call her daughter, Aniceto Boss, who lives in [place].  I called both the number listed in the chart and the number texted to me by the nurse caring for the patient; neither number worked.   -Will again request oncology consultation; based on the information present at this time, it appears that the patient is likely a hospice candidate -Would also consider palliative care consultation -It would be helpful to have a friend or family member present during the discussion given the cognitive concerns mentioned above  Anemia -Stable, will follow  HTN -Continue Lopressor  Thrush -Treat with Nystatin for now -May need to add Diflucan if ineffective  DVT prophylaxis: Lovenox Code Status:  Full - confirmed with patient Family Communication: None available  Disposition Plan:  Home once clinically improved Consults called: Oncology; consider palliative care  Admission status: It is my clinical opinion that referral for OBSERVATION is reasonable and necessary in this patient based on the above information provided. The aforementioned taken together are felt to place  the patient at high risk for further clinical deterioration. However it is anticipated that the patient may be medically stable for discharge from the hospital within 24 to 48 hours.    Karmen Bongo MD [facility]  If note is complete, please contact covering daytime or nighttime physician. [email or web address] Password [email or web address]  10/28/2016, 11:12 PM

## 2016-10-28 NOTE — ED Notes (Signed)
CRITICAL VALUE ALERT  Critical Value: Potassium 2.6  Date & Time Notied:  10/28/16 at 1445  Provider Notified: Lacinda Axon  Orders Received/Actions taken: MD made aware.

## 2016-10-29 ENCOUNTER — Encounter (HOSPITAL_COMMUNITY): Payer: Self-pay | Admitting: Primary Care

## 2016-10-29 ENCOUNTER — Inpatient Hospital Stay (HOSPITAL_COMMUNITY)
Admission: EM | Admit: 2016-10-29 | Discharge: 2016-12-04 | DRG: 377 | Disposition: E | Payer: Medicare HMO | Attending: Internal Medicine | Admitting: Internal Medicine

## 2016-10-29 ENCOUNTER — Encounter (HOSPITAL_COMMUNITY): Payer: Self-pay | Admitting: Adult Health

## 2016-10-29 ENCOUNTER — Encounter (HOSPITAL_COMMUNITY): Payer: Self-pay | Admitting: Internal Medicine

## 2016-10-29 DIAGNOSIS — K921 Melena: Principal | ICD-10-CM | POA: Diagnosis present

## 2016-10-29 DIAGNOSIS — R400 Somnolence: Secondary | ICD-10-CM | POA: Diagnosis not present

## 2016-10-29 DIAGNOSIS — Z853 Personal history of malignant neoplasm of breast: Secondary | ICD-10-CM

## 2016-10-29 DIAGNOSIS — Z79899 Other long term (current) drug therapy: Secondary | ICD-10-CM

## 2016-10-29 DIAGNOSIS — E46 Unspecified protein-calorie malnutrition: Secondary | ICD-10-CM | POA: Diagnosis present

## 2016-10-29 DIAGNOSIS — I429 Cardiomyopathy, unspecified: Secondary | ICD-10-CM | POA: Diagnosis not present

## 2016-10-29 DIAGNOSIS — D649 Anemia, unspecified: Secondary | ICD-10-CM | POA: Diagnosis not present

## 2016-10-29 DIAGNOSIS — R590 Localized enlarged lymph nodes: Secondary | ICD-10-CM | POA: Diagnosis present

## 2016-10-29 DIAGNOSIS — T17908A Unspecified foreign body in respiratory tract, part unspecified causing other injury, initial encounter: Secondary | ICD-10-CM | POA: Diagnosis not present

## 2016-10-29 DIAGNOSIS — C801 Malignant (primary) neoplasm, unspecified: Secondary | ICD-10-CM | POA: Diagnosis not present

## 2016-10-29 DIAGNOSIS — I639 Cerebral infarction, unspecified: Secondary | ICD-10-CM | POA: Diagnosis not present

## 2016-10-29 DIAGNOSIS — Z171 Estrogen receptor negative status [ER-]: Secondary | ICD-10-CM

## 2016-10-29 DIAGNOSIS — E860 Dehydration: Secondary | ICD-10-CM | POA: Diagnosis present

## 2016-10-29 DIAGNOSIS — R270 Ataxia, unspecified: Secondary | ICD-10-CM | POA: Diagnosis present

## 2016-10-29 DIAGNOSIS — R531 Weakness: Secondary | ICD-10-CM

## 2016-10-29 DIAGNOSIS — M6281 Muscle weakness (generalized): Secondary | ICD-10-CM | POA: Diagnosis not present

## 2016-10-29 DIAGNOSIS — I482 Chronic atrial fibrillation: Secondary | ICD-10-CM | POA: Diagnosis not present

## 2016-10-29 DIAGNOSIS — B379 Candidiasis, unspecified: Secondary | ICD-10-CM | POA: Diagnosis present

## 2016-10-29 DIAGNOSIS — Z681 Body mass index (BMI) 19 or less, adult: Secondary | ICD-10-CM

## 2016-10-29 DIAGNOSIS — Z9013 Acquired absence of bilateral breasts and nipples: Secondary | ICD-10-CM | POA: Diagnosis not present

## 2016-10-29 DIAGNOSIS — C7931 Secondary malignant neoplasm of brain: Secondary | ICD-10-CM | POA: Diagnosis not present

## 2016-10-29 DIAGNOSIS — R4182 Altered mental status, unspecified: Secondary | ICD-10-CM

## 2016-10-29 DIAGNOSIS — R29810 Facial weakness: Secondary | ICD-10-CM | POA: Diagnosis not present

## 2016-10-29 DIAGNOSIS — E876 Hypokalemia: Secondary | ICD-10-CM | POA: Diagnosis not present

## 2016-10-29 DIAGNOSIS — Z515 Encounter for palliative care: Secondary | ICD-10-CM

## 2016-10-29 DIAGNOSIS — E870 Hyperosmolality and hypernatremia: Secondary | ICD-10-CM | POA: Diagnosis present

## 2016-10-29 DIAGNOSIS — B370 Candidal stomatitis: Secondary | ICD-10-CM | POA: Diagnosis not present

## 2016-10-29 DIAGNOSIS — C7951 Secondary malignant neoplasm of bone: Secondary | ICD-10-CM | POA: Diagnosis present

## 2016-10-29 DIAGNOSIS — Z8249 Family history of ischemic heart disease and other diseases of the circulatory system: Secondary | ICD-10-CM

## 2016-10-29 DIAGNOSIS — I509 Heart failure, unspecified: Secondary | ICD-10-CM | POA: Diagnosis present

## 2016-10-29 DIAGNOSIS — R4781 Slurred speech: Secondary | ICD-10-CM | POA: Diagnosis not present

## 2016-10-29 DIAGNOSIS — Z9889 Other specified postprocedural states: Secondary | ICD-10-CM

## 2016-10-29 DIAGNOSIS — Z66 Do not resuscitate: Secondary | ICD-10-CM | POA: Diagnosis present

## 2016-10-29 DIAGNOSIS — R918 Other nonspecific abnormal finding of lung field: Secondary | ICD-10-CM | POA: Diagnosis not present

## 2016-10-29 DIAGNOSIS — Z87891 Personal history of nicotine dependence: Secondary | ICD-10-CM

## 2016-10-29 DIAGNOSIS — Z7982 Long term (current) use of aspirin: Secondary | ICD-10-CM

## 2016-10-29 DIAGNOSIS — Z885 Allergy status to narcotic agent status: Secondary | ICD-10-CM | POA: Diagnosis not present

## 2016-10-29 DIAGNOSIS — R140 Abdominal distension (gaseous): Secondary | ICD-10-CM | POA: Diagnosis not present

## 2016-10-29 DIAGNOSIS — R404 Transient alteration of awareness: Secondary | ICD-10-CM | POA: Diagnosis not present

## 2016-10-29 DIAGNOSIS — I10 Essential (primary) hypertension: Secondary | ICD-10-CM | POA: Diagnosis present

## 2016-10-29 DIAGNOSIS — D62 Acute posthemorrhagic anemia: Secondary | ICD-10-CM | POA: Diagnosis present

## 2016-10-29 DIAGNOSIS — G463 Brain stem stroke syndrome: Secondary | ICD-10-CM | POA: Diagnosis not present

## 2016-10-29 DIAGNOSIS — K922 Gastrointestinal hemorrhage, unspecified: Secondary | ICD-10-CM | POA: Diagnosis not present

## 2016-10-29 DIAGNOSIS — Z803 Family history of malignant neoplasm of breast: Secondary | ICD-10-CM

## 2016-10-29 DIAGNOSIS — Z7189 Other specified counseling: Secondary | ICD-10-CM

## 2016-10-29 DIAGNOSIS — I428 Other cardiomyopathies: Secondary | ICD-10-CM

## 2016-10-29 DIAGNOSIS — R29898 Other symptoms and signs involving the musculoskeletal system: Secondary | ICD-10-CM

## 2016-10-29 LAB — BASIC METABOLIC PANEL
Anion gap: 15 (ref 5–15)
Anion gap: 12 (ref 5–15)
BUN: 19 mg/dL (ref 6–20)
BUN: 17 mg/dL (ref 6–20)
CALCIUM: 8.8 mg/dL — AB (ref 8.9–10.3)
CO2: 27 mmol/L (ref 22–32)
CO2: 26 mmol/L (ref 22–32)
CREATININE: 0.58 mg/dL (ref 0.44–1.00)
Calcium: 9 mg/dL (ref 8.9–10.3)
Chloride: 108 mmol/L (ref 101–111)
Chloride: 110 mmol/L (ref 101–111)
Creatinine, Ser: 0.5 mg/dL (ref 0.44–1.00)
GFR calc Af Amer: >60 mL/min (ref >60)
GFR calc non Af Amer: >60 mL/min (ref >60)
GFR calc non Af Amer: >60 mL/min (ref >60)
Glucose, Bld: 114 mg/dL — ABNORMAL HIGH (ref 65–99)
Glucose, Bld: 119 mg/dL — ABNORMAL HIGH (ref 65–99)
Potassium: 3.5 mmol/L (ref 3.5–5.1)
Potassium: 3.3 mmol/L — ABNORMAL LOW (ref 3.5–5.1)
SODIUM: 150 mmol/L — AB (ref 135–145)
Sodium: 148 mmol/L — ABNORMAL HIGH (ref 135–145)

## 2016-10-29 LAB — CBC WITH DIFFERENTIAL/PLATELET
Basophils Absolute: 0 10*3/uL (ref 0.0–0.1)
Basophils Relative: 0 %
Eosinophils Absolute: 0 10*3/uL (ref 0.0–0.7)
Eosinophils Relative: 0 %
HCT: 24.6 % — ABNORMAL LOW (ref 36.0–46.0)
Hemoglobin: 7.7 g/dL — ABNORMAL LOW (ref 12.0–15.0)
Lymphocytes Relative: 17 %
Lymphs Abs: 1.4 10*3/uL (ref 0.7–4.0)
MCH: 26.9 pg (ref 26.0–34.0)
MCHC: 31.3 g/dL (ref 30.0–36.0)
MCV: 86 fL (ref 78.0–100.0)
Monocytes Absolute: 0.7 10*3/uL (ref 0.1–1.0)
Monocytes Relative: 9 %
Neutro Abs: 6.1 10*3/uL (ref 1.7–7.7)
Neutrophils Relative %: 74 %
Platelets: 152 10*3/uL (ref 150–400)
RBC: 2.86 MIL/uL — ABNORMAL LOW (ref 3.87–5.11)
RDW: 17.8 % — ABNORMAL HIGH (ref 11.5–15.5)
WBC: 8.3 10*3/uL (ref 4.0–10.5)

## 2016-10-29 LAB — GLUCOSE, CAPILLARY
GLUCOSE-CAPILLARY: 106 mg/dL — AB (ref 65–99)
GLUCOSE-CAPILLARY: 149 mg/dL — AB (ref 65–99)

## 2016-10-29 LAB — CBC
HCT: 27.1 % — ABNORMAL LOW (ref 36.0–46.0)
Hemoglobin: 8 g/dL — ABNORMAL LOW (ref 12.0–15.0)
MCH: 26.3 pg (ref 26.0–34.0)
MCHC: 29.5 g/dL — ABNORMAL LOW (ref 30.0–36.0)
MCV: 89.1 fL (ref 78.0–100.0)
PLATELETS: 148 10*3/uL — AB (ref 150–400)
RBC: 3.04 MIL/uL — AB (ref 3.87–5.11)
RDW: 17.2 % — ABNORMAL HIGH (ref 11.5–15.5)
WBC: 7.6 10*3/uL (ref 4.0–10.5)

## 2016-10-29 LAB — TYPE AND SCREEN
ABO/RH(D): B POS
Antibody Screen: NEGATIVE

## 2016-10-29 LAB — ABO/RH: ABO/RH(D): B POS

## 2016-10-29 MED ORDER — FLUCONAZOLE 100 MG PO TABS
150.0000 mg | ORAL_TABLET | Freq: Once | ORAL | Status: DC
  Filled 2016-10-29: qty 2

## 2016-10-29 MED ORDER — DEXTROSE 5 % IV BOLUS
500.0000 mL | Freq: Once | INTRAVENOUS | Status: AC
  Administered 2016-10-29: 500 mL via INTRAVENOUS

## 2016-10-29 MED ORDER — METOPROLOL TARTRATE 50 MG PO TABS: 50.0000 mg | ORAL_TABLET | Freq: Every day | ORAL | Status: AC

## 2016-10-29 MED ORDER — ACETAMINOPHEN 650 MG RE SUPP: 650.0000 mg | Freq: Four times a day (QID) | RECTAL | Status: DC | PRN

## 2016-10-29 MED ORDER — DEXTROSE 5 % IV SOLN
INTRAVENOUS | Status: AC
  Administered 2016-10-30: 1000 mL via INTRAVENOUS
  Administered 2016-10-30: 13:00:00 via INTRAVENOUS

## 2016-10-29 MED ORDER — NYSTATIN 100000 UNIT/ML MT SUSP: 5.0000 mL | Freq: Four times a day (QID) | OROMUCOSAL | 0 refills | Status: AC

## 2016-10-29 MED ORDER — ONDANSETRON HCL 4 MG/2ML IJ SOLN: 4.0000 mg | Freq: Four times a day (QID) | INTRAMUSCULAR | Status: DC | PRN

## 2016-10-29 MED ORDER — ONDANSETRON HCL 4 MG PO TABS: 4.0000 mg | ORAL_TABLET | Freq: Four times a day (QID) | ORAL | Status: DC | PRN

## 2016-10-29 MED ORDER — ACETAMINOPHEN 325 MG PO TABS: 650.0000 mg | ORAL_TABLET | Freq: Four times a day (QID) | ORAL | Status: DC | PRN

## 2016-10-29 NOTE — ED Provider Notes (Signed)
[facility] DEPT Provider Note   CSN: [id number] Arrival date & time: 10/14/2016  1752     History   Chief Complaint Chief Complaint  Patient presents with  . Weakness    HPI [name] is a 71 y.o. female.  HPI   71 year old female with multiple complaints. She is an extremely difficult historian. She is all over the place with her complaints. She says she was told that she is dehydrated. She was just admitted and discharged [facility] today after being admitted yesterday with hyperkalemia. She unfortunately has recurrence/metastatic cancer. Has not been evaluated again by oncology. Per nurse practitioner note earlier today, she is likely not a candidate for chemotherapy and will likely have her recommendation for hospice. She lives alone. She has some church members with her at bedside. They state that she is unable to take care of herself. Apparently a neighbor was at her residence today and noticed some rectal bleeding. Patient is seemingly unaware of this.  Past Medical History:  Diagnosis Date  . Breast cancer ([place])    many years ago  . Hypertension     Patient Active Problem List   Diagnosis Date Noted  . Palliative care encounter   . Thrush 10/28/2016  . Dehydration 10/13/2016  . Protein-calorie malnutrition, severe 10/11/2016  . Sinus tachycardia 10/10/2016  . Hypovolemia associated with vomiting 10/10/2016  . Breast mass, left 09/22/2016  . Non-ischemic cardiomyopathy ([place]) 11/30/2010  . Sepsis due to Staphylococcus aureus ([place]) 11/28/2010  . 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 Shoulder region pain 11/25/2010  . Tobacco abuse 11/25/2010  . Hypokalemia 11/25/2010  . Anemia 11/25/2010  . ANXIETY 01/01/2006  . Essential hypertension 01/01/2006  . CONGESTIVE HEART FAILURE 01/01/2006  . COPD 01/01/2006  . LOW BACK PAIN 01/01/2006  . BREAST CANCER, HX OF 01/01/2006    Past Surgical History:  Procedure Laterality Date  .  MASTECTOMY     bilateral  . SKIN BIOPSY Left 10/15/2016   Procedure: BIOPSY LEFT CHEST WALL MASS;  Surgeon: Aviva Signs, MD;  Location: [facility];  Service: General;  Laterality: Left;  . TEE WITHOUT CARDIOVERSION  11/29/2010   Procedure: TRANSESOPHAGEAL ECHOCARDIOGRAM (TEE);  Surgeon: Cristopher Estimable. Lattie Haw, MD;  Location: [facility];  Service: Cardiovascular;  Laterality: N/A;  . tumor removal from hip      OB History    No data available       Home Medications    Prior to Admission medications   Medication Sig Start Date End Date Taking? Authorizing Provider  HYDROcodone-acetaminophen (NORCO) 5-325 MG tablet Take 1 tablet by mouth every 6 (six) hours as needed for moderate pain. 10/15/16  Yes Aviva Signs, MD  magnesium oxide (MAG-OX) 400 (241.3 Mg) MG tablet Take 2 tablets (800 mg total) by mouth 3 (three) times daily. 10/17/16  Yes Reyne Dumas, MD  metoprolol tartrate (LOPRESSOR) 50 MG tablet Take 1 tablet (50 mg total) by mouth daily. 10/25/2016  Yes Johnson, Clanford L, MD  aspirin EC 81 MG tablet Take 81 mg by mouth daily.      [provider]  nystatin (MYCOSTATIN) 100000 UNIT/ML suspension Take 5 mLs (500,000 Units total) by mouth 4 (four) times daily. 11/01/2016   Johnson, Clanford L, MD  ondansetron (ZOFRAN) 8 MG tablet Take 1 tablet (8 mg total) by mouth every 4 (four) hours as needed for nausea or vomiting. 09/23/16   Twana First, MD  potassium chloride (KLOR-CON) 20 MEQ packet Take 40 mEq  by mouth 3 (three) times daily. 10/17/16 11/16/16  Reyne Dumas, MD    Family History Family History  Problem Relation Age of Onset  . Congestive Heart Failure Mother   . Breast cancer Father   . Anesthesia problems Neg Hx     Social History Social History  Substance Use Topics  . Smoking status: Former Smoker    Quit date: 11/21/2010  . Smokeless tobacco: Never Used  . Alcohol use No     Allergies   Morphine and related   Review of Systems Review of Systems  All  systems reviewed and negative, other than as noted in HPI.   Physical Exam Updated Vital Signs BP 99/63   Pulse (!) 117   Temp 98.4 F (36.9 C) (Oral)   Resp 20   SpO2 96%   Physical Exam  Constitutional: She appears well-developed and well-nourished. No distress.  HENT:  Head: Normocephalic and atraumatic.  thrush  Eyes: Conjunctivae are normal. Right eye exhibits no discharge. Left eye exhibits no discharge.  Neck: Neck supple.  Cardiovascular: Regular rhythm and normal heart sounds.  Exam reveals no gallop and no friction rub.   No murmur heard. tachycardic  Pulmonary/Chest: Effort normal and breath sounds normal. No respiratory distress.  Abdominal: Soft. She exhibits no distension. There is tenderness.  Genitourinary:  Genitourinary Comments: Chaperone present. Primary melanotic stool with some dark red/red material. Strongly heme positive.  Musculoskeletal: She exhibits no edema or tenderness.  Neurological: She is alert.  Skin: Skin is warm and dry.  Psychiatric: She has a normal mood and affect. Her behavior is normal. Thought content normal.  Nursing note and vitals reviewed.    ED Treatments / Results  Labs (all labs ordered are listed, but only abnormal results are displayed) Labs Reviewed  CBC WITH DIFFERENTIAL/PLATELET - Abnormal; Notable for the following:       Result Value   RBC 2.86 (*)    Hemoglobin 7.7 (*)    HCT 24.6 (*)    RDW 17.8 (*)    All other components within normal limits  BASIC METABOLIC PANEL - Abnormal; Notable for the following:    Sodium 148 (*)    Potassium 3.3 (*)    Glucose, Bld 119 (*)    All other components within normal limits  CBC - Abnormal; Notable for the following:    RBC 2.97 (*)    Hemoglobin 7.8 (*)    HCT 25.5 (*)    RDW 17.7 (*)    Platelets 141 (*)    All other components within normal limits  CBC - Abnormal; Notable for the following:    RBC 2.91 (*)    Hemoglobin 7.7 (*)    HCT 24.6 (*)    RDW 17.6 (*)     Platelets 133 (*)    All other components within normal limits  CBC - Abnormal; Notable for the following:    RBC 3.03 (*)    Hemoglobin 8.1 (*)    HCT 25.9 (*)    RDW 17.7 (*)    Platelets 138 (*)    All other components within normal limits  BASIC METABOLIC PANEL - Abnormal; Notable for the following:    Sodium 147 (*)    Potassium 3.4 (*)    Glucose, Bld 127 (*)    Creatinine, Ser 0.41 (*)    Calcium 8.8 (*)    All other components within normal limits  FOLATE - Abnormal; Notable for the following:    Folate  2.1 (*)    All other components within normal limits  IRON AND TIBC - Abnormal; Notable for the following:    TIBC 181 (*)    Saturation Ratios 36 (*)    All other components within normal limits  FERRITIN - Abnormal; Notable for the following:    Ferritin 1,203 (*)    All other components within normal limits  RETICULOCYTES - Abnormal; Notable for the following:    RBC. 3.03 (*)    All other components within normal limits  COMPREHENSIVE METABOLIC PANEL - Abnormal; Notable for the following:    Potassium 3.1 (*)    Glucose, Bld 114 (*)    Calcium 8.6 (*)    Total Protein 5.7 (*)    Albumin 2.5 (*)    Alkaline Phosphatase 228 (*)    All other components within normal limits  CBC - Abnormal; Notable for the following:    RBC 2.92 (*)    Hemoglobin 7.6 (*)    HCT 24.1 (*)    RDW 17.2 (*)    Platelets 135 (*)    All other components within normal limits  VITAMIN B12  TYPE AND SCREEN  ABO/RH    EKG  EKG Interpretation None       Radiology No results found.  Procedures Procedures (including critical care time)  Medications Ordered in ED Medications - No data to display   Initial Impression / Assessment and Plan / ED Course  I have reviewed the triage vital signs and the nursing notes.  Pertinent labs & imaging results that were available during my care of the patient were reviewed by me and considered in my medical decision making (see chart  for details).     71 year old female with metastatic cancer and progressive decline. She is not a very good historian. She is seemingly unable to adequately care for herself at home at this point though. Noted to be tachycardic but historically seems to have an elevated heart rate. Past medical history does the sinus tachycardia. She does have melanotic/maroon stool on exam which is heme-positive. Will she recheck a BMP and CBC. Will discuss with social work. She was already discharged from the hospital earlier today. If we do not have a medical reason to admit her back to the hospital again this evening then we'll hold in the emergency room until appropriate disposition is in place.   Final Clinical Impressions(s) / ED Diagnoses   Final diagnoses:  Gastrointestinal hemorrhage, unspecified gastrointestinal hemorrhage type  Muscular deconditioning  Anemia, unspecified type    New Prescriptions New Prescriptions   No medications on file     Virgel Manifold, MD 10/31/16 1250

## 2016-10-29 NOTE — H&P (Signed)
History and Physical    [name] DXA:[id number] DOB: 1945/03/27 DOA: 10/10/2016  PCP: Patient, No Pcp Per  Patient coming from: Home.  Chief Complaint: Weakness.  HPI: [name] is a 71 y.o. female with history of metastatic breast cancer was a recent recurrence who was admitted to [facility] for dehydration and was discharged yesterday and was taken home. At home patient's friend found that patient was weak and not doing well. Parents also noticed that patient had some blood in the underpants.  ED Course: In the ER patient's hemoglobin has decreased by 1 g and ER physician did rectal exam and found dark stools maroon colored. Patient's sodium levels are also elevated. Patient is a poor historian and is not able to exactly say why she is here.   Review of Systems: As per HPI, rest all negative.   Past Medical History:  Diagnosis Date  . Breast cancer ([place])    many years ago  . Hypertension     Past Surgical History:  Procedure Laterality Date  . MASTECTOMY     bilateral  . SKIN BIOPSY Left 10/15/2016   Procedure: BIOPSY LEFT CHEST WALL MASS;  Surgeon: Aviva Signs, MD;  Location: [facility];  Service: General;  Laterality: Left;  . TEE WITHOUT CARDIOVERSION  11/29/2010   Procedure: TRANSESOPHAGEAL ECHOCARDIOGRAM (TEE);  Surgeon: Cristopher Estimable. Lattie Haw, MD;  Location: [facility];  Service: Cardiovascular;  Laterality: N/A;  . tumor removal from hip       reports that she quit smoking about 5 years ago. She has never used smokeless tobacco. She reports that she does not drink alcohol or use drugs.  Allergies  Allergen Reactions  . Morphine And Related Itching    Family History  Problem Relation Age of Onset  . Congestive Heart Failure Mother   . Breast cancer Father   . Anesthesia problems Neg Hx     Prior to Admission medications   Medication Sig Start Date End Date Taking? Authorizing Provider  aspirin EC 81 MG tablet Take 81 mg by mouth daily.      Yes [provider]  HYDROcodone-acetaminophen (NORCO) 5-325 MG tablet Take 1 tablet by mouth every 6 (six) hours as needed for moderate pain. 10/15/16  Yes Aviva Signs, MD  magnesium oxide (MAG-OX) 400 (241.3 Mg) MG tablet Take 2 tablets (800 mg total) by mouth 3 (three) times daily. 10/17/16  Yes Reyne Dumas, MD  metoprolol tartrate (LOPRESSOR) 50 MG tablet Take 1 tablet (50 mg total) by mouth daily. 10/13/2016  Yes Johnson, Clanford L, MD  ondansetron (ZOFRAN) 8 MG tablet Take 1 tablet (8 mg total) by mouth every 4 (four) hours as needed for nausea or vomiting. 09/23/16  Yes Twana First, MD  potassium chloride (KLOR-CON) 20 MEQ packet Take 40 mEq by mouth 3 (three) times daily. 10/17/16 11/16/16 Yes Reyne Dumas, MD  nystatin (MYCOSTATIN) 100000 UNIT/ML suspension Take 5 mLs (500,000 Units total) by mouth 4 (four) times daily. 10/19/2016   Murlean Iba, MD    Physical Exam: Vitals:   10/15/2016 2003 10/22/2016 2043 10/26/2016 2107 10/25/2016 2234  BP: 120/73 120/73  121/73  Pulse: (!) 119 (!) 115  (!) 121  Resp: (!) 29 16  (!) 27  Temp:      TempSrc:      SpO2: 94% 91% 93% 94%      Constitutional: Moderately built and nourished. Vitals:   10/26/2016 2003 10/20/2016 2043 10/15/2016 2107 10/31/2016 2234  BP: 120/73 120/73  121/73  Pulse: (!) 119 (!) 115  (!) 121  Resp: (!) 29 16  (!) 27  Temp:      TempSrc:      SpO2: 94% 91% 93% 94%   Eyes: Anicteric mild pallor. ENMT:  No discharge from the ears eyes nose and mouth. Neck:  No mass felt. No neck rigidity. Respiratory:  No rhonchi or crepitations. Cardiovascular:  S1-S2. No murmurs appreciated. Abdomen:  Soft nontender bowel sounds present. Musculoskeletal:  No edema. No joint effusion. Skin:  No rash. Skin appears warm. Neurologic: alert awake oriented to name. Appears confused otherwise. Follows commands and moves all extremities. Psychiatric:  Appears confused oriented to name. Follows commands.   Labs on Admission: I have  personally reviewed following labs and imaging studies  CBC:  Recent Labs Lab 10/28/16 1351 11/02/2016 0552 10/04/2016 2019  WBC 8.9 7.6 8.3  NEUTROABS  --   --  6.1  HGB 9.1* 8.0* 7.7*  HCT 29.3* 27.1* 24.6*  MCV 86.9 89.1 86.0  PLT 162 148* 196   Basic Metabolic Panel:  Recent Labs Lab 10/28/16 1351 10/09/2016 0552 10/04/2016 2019  NA 145 150* 148*  K 2.6* 3.5 3.3*  CL 102 108 110  CO2 28 27 26   GLUCOSE 144* 114* 119*  BUN 22* 19 17  CREATININE 0.65 0.58 0.50  CALCIUM 9.3 8.8* 9.0   GFR: Estimated Creatinine Clearance: 53.7 mL/min (by C-G formula based on SCr of 0.5 mg/dL). Liver Function Tests: No results for input(s): AST, ALT, ALKPHOS, BILITOT, PROT, ALBUMIN in the last 168 hours. No results for input(s): LIPASE, AMYLASE in the last 168 hours. No results for input(s): AMMONIA in the last 168 hours. Coagulation Profile: No results for input(s): INR, PROTIME in the last 168 hours. Cardiac Enzymes: No results for input(s): CKTOTAL, CKMB, CKMBINDEX, TROPONINI in the last 168 hours. BNP (last 3 results) No results for input(s): PROBNP in the last 8760 hours. HbA1C: No results for input(s): HGBA1C in the last 72 hours. CBG:  Recent Labs Lab 10/28/16 2101 10/05/2016 0746 10/09/2016 1147  GLUCAP 121* 106* 149*   Lipid Profile: No results for input(s): CHOL, HDL, LDLCALC, TRIG, CHOLHDL, LDLDIRECT in the last 72 hours. Thyroid Function Tests: No results for input(s): TSH, T4TOTAL, FREET4, T3FREE, THYROIDAB in the last 72 hours. Anemia Panel: No results for input(s): VITAMINB12, FOLATE, FERRITIN, TIBC, IRON, RETICCTPCT in the last 72 hours. Urine analysis:    Component Value Date/Time   COLORURINE YELLOW 10/11/2016 0350   APPEARANCEUR CLEAR 10/11/2016 0350   LABSPEC 1.017 10/11/2016 0350   PHURINE 6.0 10/11/2016 0350   GLUCOSEU NEGATIVE 10/11/2016 0350   HGBUR NEGATIVE 10/11/2016 0350   BILIRUBINUR NEGATIVE 10/11/2016 0350   KETONESUR 20 (A) 10/11/2016 0350    PROTEINUR 30 (A) 10/11/2016 0350   UROBILINOGEN 4.0 (H) 11/26/2010 1105   NITRITE NEGATIVE 10/11/2016 0350   LEUKOCYTESUR NEGATIVE 10/11/2016 0350   Sepsis Labs: @LABRCNTIP (procalcitonin:4,lacticidven:4) )No results found for this or any previous visit (from the past 240 hour(s)).   Radiological Exams on Admission: No results found.   Assessment/Plan Principal Problem:   Acute GI bleeding Active Problems:   Essential hypertension   Non-ischemic cardiomyopathy (H[facility])   Acute blood loss anemia   Hypernatremia   1. Possible acute GI bleed - we'll continue to monitor serial CBCs. May need gastroenterology consult in a.m. 2. Dehydration with hypernatremia - we'll keep patient on D5W. Dehydration likely from poor oral intake. 3. Metastatic breast cancer was evaluated  by oncologist yesterday and recommended no systemic therapy given the patient's functional status. Consult hospice. 4. History of nonischemic cardiomyopathy - appears dehydrated at this time. 5. Hypertension on metoprolol.   DVT prophylaxis:  SCDs. Code Status:  Full code.  Family Communication:  No family at the bedside.  Disposition Plan:  To be determined.  Consults called:  Hospice.  Admission status:  Inpatient.    Rise Patience MD [facility] Pager [phone].  If 7PM-7AM, please contact night-coverage [email or web address] Password [facility]  11/01/2016, 11:26 PM

## 2016-10-29 NOTE — Progress Notes (Signed)
Patient discharged from hospital today.   Message sent to our cancer center schedulers to get their help in trying to bring the patient in for follow-up sometime next week for goals of care discussion.  She is not likely to be a candidate for chemotherapy and we will share our recommendations for hospice with her at that time.  I have asked our schedulers to please encourage patient to bring a family member or caregiver to come to this visit at cancer center, as patient is poor historian.    Will try to see her 10/3 or 10/4 next week, if patient is able.    Mike Craze, NP [place] [phone]

## 2016-10-29 NOTE — Care Management Note (Addendum)
Case Management Note  Patient Details  Name: [name] MRN: [id number] Date of Birth: [birth year]  Subjective/Objective:                  Admitted with dehydrations. Recently discharged from stay for vomiting/hypovolemia and metastatic breat cancer. Pt is from home, lives alone, she has strong family support. She is very ind at her baseline. She was recently [place] home with [facility] services through [facility]. She is receiving home NP services to act as PCP for [facility]. She enjoys this very much. She was using a walker prior to this admission. She is on oxygen acutely, she doesn't know why, it is bothering her. She wants to go home, she wants her RN, PT and NP to cont to come to her home.   Action/Plan: CM will cont to follow. [facility] rep, aware pt here under observation. Palliative consult pending. Anticipate DC in next 24 hrs. Will need to wean from oxygen.   Expected Discharge Date:       10/30/2016           Expected Discharge Plan:  [place]  In-House Referral:  Hospice / Palliative Care  Discharge planning Services  CM Consult  Post Acute Care Choice:  Home Health, Resumption of Svcs/PTA Provider Choice offered to:  Patient  HH Arranged:  RN, PT [place] Agency:  [facility] (formerly [facility])  Status of Service:  In process, will continue to follow   Addendum: Pt discharging home today. [facility] rep, Tim, aware of DC home, no resumption order needed. No new HH or DME needed. CM signing off.   Sherald Barge, RN 10/17/2016, 10:45 AM

## 2016-10-29 NOTE — Consult Note (Signed)
[facility] Consultation Oncology  Name: [name]      MRN: [id number]    Location: M353/I144-31  Date: 10/18/2016 Time:11:29 AM   REFERRING PHYSICIAN:  Dr. Karmen Bongo  REASON FOR CONSULT:  Metastatic breast cancer    DIAGNOSIS:  Metastatic breast cancer  HISTORY OF PRESENT ILLNESS:  [name] 71 y.o. female is a patient known to [facility]. She has history of bilateral breast cancer s/p bilateral mastectomies; she reportedly also received chemotherapy, but is a documented poor historian. On 09/05/16, she had CT chest to r/o PE d/t cough; CT revealed no PE, significant large mediastinal and left hilar masses suspicious for metastatic adenopathy; also noted to have multiple sclerotic vertebral metastases, with T7 lesion noted to have paraspinal extension.  She was seen at the cancer center on 09/22/16 by Dr. Talbert Cage.  She was referred to Dr. Arnoldo Morale for chest wall biopsy; she was noted to be tachycardic at the time of her evaluation with surgeon and beta-blocker therapy was re-initiated (patient had reportedly stopped taking Metoprolol ~1 year ago on her own); tachycardia did not improve and she was sent to cardiology for further evaluation.  She then was admitted to the hospital for 7 days for vomiting and dehydration (9/7-9/14).  (L) chest wall mass biopsy was able to be done while she was an inpatient on 10/15/16; path revealed poorly differentiated metastatic carcinoma consistent with patient's history of breast cancer; ER-/PR-.     We were planning to see her back for follow-up at cancer center on 10/14/16, but patient missed this appointment because she was hospitalized from 10/10/16-10/17/16 for dehydration secondary to N&V.  She was also scheduled to return for follow-up at cancer center yesterday, 10/28/16, but was in ED and now admitted for generalized weakness, dehydration, and malnutrition. At time of her last visit at cancer center in 09/2016, patient was clear in her  wishes that she did not desire any additional chemotherapy.   It appears she has continued to clinically deteriorate. Palliative care consultation is in process for this hospitalization.     PAST MEDICAL HISTORY:   Past Medical History:  Diagnosis Date  . Breast cancer ([place])    many years ago  . Hypertension     ALLERGIES: Allergies  Allergen Reactions  . Morphine And Related Itching      MEDICATIONS: I have reviewed the patient's current medications.     PAST SURGICAL HISTORY Past Surgical History:  Procedure Laterality Date  . MASTECTOMY     bilateral  . SKIN BIOPSY Left 10/15/2016   Procedure: BIOPSY LEFT CHEST WALL MASS;  Surgeon: Aviva Signs, MD;  Location: [facility] ORS;  Service: General;  Laterality: Left;  . TEE WITHOUT CARDIOVERSION  11/29/2010   Procedure: TRANSESOPHAGEAL ECHOCARDIOGRAM (TEE);  Surgeon: Cristopher Estimable. Lattie Haw, MD;  Location: [facility] ENDO SUITE;  Service: Cardiovascular;  Laterality: N/A;  . tumor removal from hip      FAMILY HISTORY: Family History  Problem Relation Age of Onset  . Congestive Heart Failure Mother   . Breast cancer Father   . Anesthesia problems Neg Hx     SOCIAL HISTORY:  reports that she quit smoking about 5 years ago. She has never used smokeless tobacco. She reports that she does not drink alcohol or use drugs.    PHYSICAL EXAM: Most Recent Vital Signs: Blood pressure (!) 105/55, pulse (!) 112, temperature 98.2 F (36.8 C), temperature source Oral, resp. rate 18, height '5\' 7"'$  (1.702 m), weight  118 lb (53.5 kg), SpO2 97 %.   LABORATORY DATA:  Results for orders placed or performed during the hospital encounter of 10/28/16 (from the past 48 hour(s))  Basic metabolic panel     Status: Abnormal   Collection Time: 10/28/16  1:51 PM  Result Value Ref Range   Sodium 145 135 - 145 mmol/L   Potassium 2.6 (LL) 3.5 - 5.1 mmol/L    Comment: CRITICAL RESULT CALLED TO, READ BACK BY AND VERIFIED WITH: ROBERTSON,L AT 1445 ON 9.25.2018 BY  ISLEY,B    Chloride 102 101 - 111 mmol/L   CO2 28 22 - 32 mmol/L   Glucose, Bld 144 (H) 65 - 99 mg/dL   BUN 22 (H) 6 - 20 mg/dL   Creatinine, Ser 0.65 0.44 - 1.00 mg/dL   Calcium 9.3 8.9 - 10.3 mg/dL   GFR calc non Af Amer >60 >60 mL/min   GFR calc Af Amer >60 >60 mL/min    Comment: (NOTE) The eGFR has been calculated using the CKD EPI equation. This calculation has not been validated in all clinical situations. eGFR's persistently <60 mL/min signify possible Chronic Kidney Disease.    Anion gap 15 5 - 15  CBC     Status: Abnormal   Collection Time: 10/28/16  1:51 PM  Result Value Ref Range   WBC 8.9 4.0 - 10.5 K/uL   RBC 3.37 (L) 3.87 - 5.11 MIL/uL   Hemoglobin 9.1 (L) 12.0 - 15.0 g/dL   HCT 29.3 (L) 36.0 - 46.0 %   MCV 86.9 78.0 - 100.0 fL   MCH 27.0 26.0 - 34.0 pg   MCHC 31.1 30.0 - 36.0 g/dL   RDW 17.5 (H) 11.5 - 15.5 %   Platelets 162 150 - 400 K/uL  Glucose, capillary     Status: Abnormal   Collection Time: 10/28/16  9:01 PM  Result Value Ref Range   Glucose-Capillary 121 (H) 65 - 99 mg/dL   Comment 1 Notify RN    Comment 2 Document in Chart   Basic metabolic panel     Status: Abnormal   Collection Time: 10/05/2016  5:52 AM  Result Value Ref Range   Sodium 150 (H) 135 - 145 mmol/L   Potassium 3.5 3.5 - 5.1 mmol/L    Comment: DELTA CHECK NOTED   Chloride 108 101 - 111 mmol/L   CO2 27 22 - 32 mmol/L   Glucose, Bld 114 (H) 65 - 99 mg/dL   BUN 19 6 - 20 mg/dL   Creatinine, Ser 0.58 0.44 - 1.00 mg/dL   Calcium 8.8 (L) 8.9 - 10.3 mg/dL   GFR calc non Af Amer >60 >60 mL/min   GFR calc Af Amer >60 >60 mL/min    Comment: (NOTE) The eGFR has been calculated using the CKD EPI equation. This calculation has not been validated in all clinical situations. eGFR's persistently <60 mL/min signify possible Chronic Kidney Disease.    Anion gap 15 5 - 15  CBC     Status: Abnormal   Collection Time: 10/16/2016  5:52 AM  Result Value Ref Range   WBC 7.6 4.0 - 10.5 K/uL   RBC  3.04 (L) 3.87 - 5.11 MIL/uL   Hemoglobin 8.0 (L) 12.0 - 15.0 g/dL   HCT 27.1 (L) 36.0 - 46.0 %   MCV 89.1 78.0 - 100.0 fL   MCH 26.3 26.0 - 34.0 pg   MCHC 29.5 (L) 30.0 - 36.0 g/dL   RDW 17.2 (H) 11.5 - 15.5 %  Platelets 148 (L) 150 - 400 K/uL  Glucose, capillary     Status: Abnormal   Collection Time: 10/10/2016  7:46 AM  Result Value Ref Range   Glucose-Capillary 106 (H) 65 - 99 mg/dL   Comment 1 Notify RN    Comment 2 Document in Chart       RADIOGRAPHY: CTA chest 09/05/16 CLINICAL DATA:  Cough for the past month. History of breast cancer. Clinical concern for pulmonary embolism.  EXAM: CT ANGIOGRAPHY CHEST WITH CONTRAST  TECHNIQUE: Multidetector CT imaging of the chest was performed using the standard protocol during bolus administration of intravenous contrast. Multiplanar CT image reconstructions and MIPs were obtained to evaluate the vascular anatomy.  CONTRAST:  100 cc Isovue 370  COMPARISON:  Chest radiographs obtained earlier today. Chest CTA dated 03/07/2016.  FINDINGS: Cardiovascular: Normally opacified pulmonary arteries with no pulmonary arterial filling defects seen. Atheromatous arterial calcifications, including the thoracic aorta. No pericardial effusion.  Mediastinum/Nodes: The previously demonstrated 2.7 x 2.0 cm pretracheal superior mediastinal mass is smaller, measuring 2.2 x 1.1 cm on image number 25 of series 7.  There has been interval development of a large superior mediastinal on the right, displacing the trachea and esophagus to the left. This measures 5.1 x 3.5 cm on image number 14 of series 7. On coronal image number 64 of series 10, this measures 4.3 cm in length.  There is also interval left superior mediastinal mass, measuring 3.2 x 2.8 cm on image number 22 of series 7, partially encasing the great vessels. This measures 2.5 cm in length on image number 72 of series 10.  There is an additional retrotracheal mass hat above  the level of the carina with some central and peripheral air or gas and low density. This measures 4.4 x 3.3 cm on image number 33 of series 7 and 5.7 cm in length on coronal image number 82 of series 10.  False demonstrated is a proximal left hilar mass posterior to the left mainstem bronchus, measuring 2.7 x 1.8 cm on image number 42 of series 7 and 3.1 cm in length on coronal image number 87 of series 10. This is encasing a vessel centrally  The esophagus is difficult to separate from some of these masses.  There is also interval prominent anterior and lateral paraspinal soft tissue at the is T7 level with patchy sclerosis and some patchy lucency of the underlying T7 vertebral body. This soft tissue thickening measures 10 mm in maximum thickness on image number 44 of series 7.  Bilateral axillary surgical clips with no enlarged axillary lymph nodes seen.  Lungs/Pleura: The lungs are hyperexpanded with bilateral upper lobe bullous changes. There is also some diffuse peribronchial thickening.  The previously demonstrated 10 mm area of minimal patchy interstitial prominence in the right upper lobe has not changed significantly, measuring 11 mm in maximum diameter on image number 24 of series 9.  The previously demonstrated 9 mm oval patchy interstitial opacity in the left upper lobe is no longer seen. Slightly more posteriorly at that level, there is an interval 9 mm similar area.  Mild bilateral lower lobe linear atelectasis or scarring is noted.  Upper Abdomen: Prominent azygos and hemiazygous veins.  Musculoskeletal: Interval patchy sclerosis involving a significant portion of the T7 vertebral body and interval similar appearance in the C7, T1, T2 and T3 vertebral bodies. Bilateral postmastectomy changes.  Review of the MIP images confirms the above findings.  IMPRESSION: 1. No pulmonary emboli. 2. Interval multiple  large mediastinal and left hilar  masses, as described above. These are suspicious for metastatic adenopathy. A lymphoproliferative disorder could also have this appearance. The mass containing gas or air could at least partially represent a segment of dilated esophagus containing ingested material. Alternatively, this could represent a necrotic mass or necrotic enlarged lymph node. 3. Interval multiple sclerotic vertebral metastases. The T7 metastasis also has paraspinal extension of tumor anteriorly and laterally, greater on the right. 4. 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 5. Stable 10 mm patchy nodular density in the right upper lobe with resolution of the previously demonstrated 9 mm similar density in the left upper lobe. 6. Interval development of a new 9 mm patchy nodular density in the left upper lobe, near the previously seen 9 mm area. These could represent areas of mild focal infection or inflammation. These could also represent changes associated with patient's COPD. 7. Calcified aortic atherosclerosis.  Aortic Atherosclerosis (ICD10-I70.0) and Emphysema (ICD10-J43.9).   Electronically Signed   By: Claudie Revering M.D.   On: 09/05/2016 16:31      PATHOLOGY:   (L) chest wall biopsy: 10/15/16       ASSESSMENT & PLAN:   Metastatic breast cancer:  -Discussed with Dr. Talbert Cage. Given patient's progressively deteriorating clinical status and continued hospitalizations, we would not recommend any systemic therapy (i.e. Chemo/biotherapy) for her. The risks far outweigh any possible, likely limited benefit.  Her disease burden is significant and performance status is declining.  Her cancer is not curable and she is currently too weak to consider any systemic therapy.  Also, based on her wishes, she is not interested in any additional chemotherapy.  -Given her symptom burden and likely continued need for supportive care, inpatient hospice would certainly be appropriate.  Her prognosis is very  poor.  We suspect < 3 month life expectancy with no treatment for her malignancy.   -Palliative radiation to any symptomatic lesions could be a possibility, particularly to the bone/spine lesions, to help with symptoms.  Referral to [facility] or [facility] in [place] both have radiation services available.   -Discussed patient's case with Quinn Axe, NP with palliative care.      Dispo:  -Looks like the plan is for patient to be discharged home to resume home health services.  I think home hospice would be a great option for her, if she agrees.   -We are happy to see the patient as an outpatient to discuss these recommendations with her after she has had time to recover from current hospitalization. However, her prognosis remains poor.     The patient and plan discussed with Dr. Talbert Cage and she is in agreement with the aforementioned.  Patient was not formally seen/examined; recommendations made based on chart review and discussion with palliative care team. No charge for oncology consultation.    Mike Craze, NP [place] [phone]

## 2016-10-29 NOTE — Care Management Obs Status (Signed)
[place] NOTIFICATION   Patient Details  Name: [name] MRN: [id number] Date of Birth: [birth year]   Medicare Observation Status Notification Given:  Yes    Sherald Barge, RN 10/08/2016, 10:45 AM

## 2016-10-29 NOTE — Consult Note (Signed)
Consultation Note Date: 10/20/2016   Patient Name: [name]  DOB: October 22, 1945  MRN: [id number]  Age / Sex: 71 y.o., female  PCP: Patient, No Pcp Per Referring Physician: Murlean Iba, MD  Reason for Consultation: Establishing goals of care and Psychosocial/spiritual support  HPI/Patient Profile: 71 y.o. female  with past medical history of hypertension and brest cancer double mastectomy, with chest wall cancer recurrence admitted on 10/28/2016 with dehydration.   Prognosis:   < 3 months, per oncology consult  Discharge Planning: discharge home with home health. Would benefit from hospice referral      Primary Diagnoses: Present on Admission: . Dehydration . Anemia . Essential hypertension . Hypokalemia . Thrush   I have reviewed the medical record, interviewed the patient and family, and examined the patient. The following aspects are pertinent.  Past Medical History:  Diagnosis Date  . Breast cancer ([place])    many years ago  . Hypertension    Social History   Social History  . Marital status: Legally Separated    Spouse name: N/A  . Number of children: N/A  . Years of education: N/A   Occupational History  . Nurses Aid     [employer]   Social History Main Topics  . Smoking status: Former Smoker    Quit date: 11/21/2010  . Smokeless tobacco: Never Used  . Alcohol use No  . Drug use: No  . Sexual activity: Not Asked   Other Topics Concern  . None   Social History Narrative   Lives in [place] things are her 5 grandchildren (4 girls and a boy), and cooking for family. Cornbread is her specialty; "secret is a dash of vanilla and a pinch of sugar - tastes like a cake."      Family History  Problem Relation Age of Onset  . Congestive Heart Failure Mother   . Breast cancer Father   . Anesthesia problems Neg Hx    Scheduled Meds: . aspirin EC  81 mg  Oral Daily  . docusate sodium  100 mg Oral BID  . enoxaparin (LOVENOX) injection  40 mg Subcutaneous Q24H  . insulin aspart  0-5 Units Subcutaneous QHS  . insulin aspart  0-9 Units Subcutaneous TID WC  . magnesium oxide  800 mg Oral TID  . nystatin  5 mL Oral QID  . potassium chloride  40 mEq Oral TID   Continuous Infusions: PRN Meds:.acetaminophen **OR** acetaminophen, HYDROcodone-acetaminophen, ondansetron **OR** ondansetron (ZOFRAN) IV Medications Prior to Admission:  Prior to Admission medications   Medication Sig Start Date End Date Taking? Authorizing Provider  aspirin EC 81 MG tablet Take 81 mg by mouth daily.     Yes [provider]  HYDROcodone-acetaminophen (NORCO) 5-325 MG tablet Take 1 tablet by mouth every 6 (six) hours as needed for moderate pain. 10/15/16  Yes Aviva Signs, MD  magnesium oxide (MAG-OX) 400 (241.3 Mg) MG tablet Take 2 tablets (800 mg total) by mouth 3 (three) times daily. 10/17/16  Yes Reyne Dumas, MD  metoprolol tartrate (LOPRESSOR) 50 MG tablet Take 1 tablet (50 mg total) by mouth 2 (two) times daily. Patient taking differently: Take 50 mg by mouth daily.  10/10/16  Yes Herminio Commons, MD  ondansetron (ZOFRAN) 8 MG tablet Take 1 tablet (8 mg total) by mouth every 4 (four) hours as needed for nausea or vomiting. 09/23/16  Yes Twana First, MD  potassium chloride (KLOR-CON) 20 MEQ packet Take 40 mEq by mouth 3 (three) times daily. 10/17/16 11/16/16 Yes Reyne Dumas, MD   Allergies  Allergen Reactions  . Morphine And Related Itching   Review of Systems  Physical Exam  Vital Signs: BP (!) 105/55 (BP Location: Right Arm)   Pulse (!) 112   Temp 98.2 F (36.8 C) (Oral)   Resp 18   Ht 5\' 7"  (1.702 m)   Wt 53.5 kg (118 lb)   SpO2 97%   BMI 18.48 kg/m  Pain Assessment: No/denies pain   Pain Score: 0-No pain   SpO2: SpO2: 97 % O2 Device:SpO2: 97 % O2 Flow Rate: .O2 Flow Rate (L/min): 0 L/min  IO: Intake/output summary:    Intake/Output Summary (Last 24 hours) at 10/16/2016 1003 Last data filed at 10/07/2016 0537  Gross per 24 hour  Intake             1820 ml  Output                0 ml  Net             1820 ml    LBM: Last BM Date: 10/28/16 Baseline Weight: Weight: 57.2 kg (126 lb) Most recent weight: Weight: 53.5 kg (118 lb)     Palliative Assessment/Data:   Flowsheet Rows     Most Recent Value  Intake Tab  Referral Department  Hospitalist  Unit at Time of Referral  Med/Surg Unit  Date Notified  10/28/16  Date of Admission  10/28/16  Date first seen by Palliative Care  10/26/2016  # of days Palliative referral response time  1 Day(s)  # of days IP prior to Palliative referral  0  Clinical Assessment  Psychosocial & Spiritual Assessment  Palliative Care Outcomes      Discharged before being seen.  No charge Greater than 50%  of this time was spent counseling and coordinating care related to the above assessment and plan.  Signed by: Drue Novel, NP   Please contact Palliative Medicine Team phone at [phone] for questions and concerns.  For individual provider: See Shea Evans

## 2016-10-29 NOTE — ED Notes (Signed)
Bed: WA17 Expected date:  Expected time:  Means of arrival:  Comments: 71 yo f weakness

## 2016-10-29 NOTE — ED Triage Notes (Signed)
Per [facility] EMS patient from home for weakness for week. patient was discharged this morning from [facility] but unsatisfied with her care there "states they didn't do anything for me" also recent diagnosis of cancer without any treatments set up at this time.  patient having nausea and has home Zofran 8mg  but not taking by her choice per EMS.  Patient [phone]

## 2016-10-29 NOTE — Progress Notes (Signed)
Patient states understanding of discharge instructions, discharge package given

## 2016-10-29 NOTE — Discharge Summary (Signed)
Physician Discharge Summary  [name] KGM:[id number] DOB: 07/15/1945 DOA: 10/28/2016  PCP: Patient, No Pcp Per Oncologist: Dr. Talbert Cage  Admit date: 10/28/2016 Discharge date: 10/20/2016  Admitted From: Home  Disposition: Home   Recommendations for Outpatient Follow-up:  1. Follow up with PCP in 1 weeks 2. Follow up with oncologist in 1 week 3. Please obtain BMP/CBC in one week  Home Health: Resume prior services HH Arranged:  RN, PT [place] Agency:  [facility] (formerly [facility])  Discharge Condition: STABLE   CODE STATUS: FULL    Brief Hospitalization Summary: Please see all hospital notes, images, labs for full details of the hospitalization. Chief Complaint: weakness  HPI: [name] is a 71 y.o. female with medical history significant of remote breast cancer and HTN.   She was admitted from 9/7-9/14 with vomiting/hypovolemia thought to be due to metastatic breast cancer with mediastinal masses displacing esophagus and also vertebral metastatic disease.  She is a very poor historian.  When asked what brought her in she reports, "There's so much going on.  My butt's sore, I wish I could get off my butt for a minute."  She then went on to explain that she has been doing well since last discharge - "going to church, cooking and cleaning and all that stuff".  She thinks she is dehydrated today, despite reporting eating and drinking well.  "I don't know what's going on."    ED Course:  Metastatic disease.  Dehydration with hypokalemia.  IVF, KCl.   Dehydration with hypokalemia -Pt was treated with IVF hydration and IV potassium replacement with good results, K improved to normal, resume home potassium supplement (although I suspect she is not taking it as prescribed) She was counseled on this and verbalized understanding.  K improved to 3.5.  She was also given bolus of D5W for extra free water prior to discharge as sodium did rise to 150, recheck BMP in 1 week  recommended.     Breast cancer -Patient with h/o B breast cancers s/p B mastectomies -Recent left chest wall biopsy indicated poorly differentiated metastatic carcinoma - CTA with mediastinal and hilar adenopathy and also bony mets -Patient was to f/u with oncology yesterday and missed her appointment due to transportation issues -requested an oncology consult prior to discharge.  Anemia -Stable, will follow  HTN -Continue Lopressor  Thrush -Treat with Nystatin for now -oral diflucan given x 1 dose  DVT prophylaxis: Lovenox Code Status:  Full - confirmed with patient Family Communication: None available  Disposition Plan:  Home with resumption of [place] services Consults called: Oncology; consider palliative care   Discharge Diagnoses:  Principal Problem:   Dehydration Active Problems:   Essential hypertension   BREAST CANCER, HX OF   Hypokalemia   Anemia   Thrush  Discharge Instructions: Discharge Instructions    Call MD for:  difficulty breathing, headache or visual disturbances    Complete by:  As directed    Call MD for:  extreme fatigue    Complete by:  As directed    Call MD for:  hives    Complete by:  As directed    Call MD for:  persistant dizziness or light-headedness    Complete by:  As directed    Call MD for:  persistant nausea and vomiting    Complete by:  As directed    Call MD for:  severe uncontrolled pain    Complete by:  As directed  Call MD for:  temperature >100.4    Complete by:  As directed    Diet - low sodium heart healthy    Complete by:  As directed    Increase activity slowly    Complete by:  As directed      Allergies as of 10/11/2016      Reactions   Morphine And Related Itching      Medication List    TAKE these medications   aspirin EC 81 MG tablet Take 81 mg by mouth daily.   HYDROcodone-acetaminophen 5-325 MG tablet Commonly known as:  NORCO Take 1 tablet by mouth every 6 (six) hours as needed for moderate  pain.   magnesium oxide 400 (241.3 Mg) MG tablet Commonly known as:  MAG-OX Take 2 tablets (800 mg total) by mouth 3 (three) times daily.   metoprolol tartrate 50 MG tablet Commonly known as:  LOPRESSOR Take 1 tablet (50 mg total) by mouth daily.   nystatin 100000 UNIT/ML suspension Commonly known as:  MYCOSTATIN Take 5 mLs (500,000 Units total) by mouth 4 (four) times daily.   ondansetron 8 MG tablet Commonly known as:  ZOFRAN Take 1 tablet (8 mg total) by mouth every 4 (four) hours as needed for nausea or vomiting.   potassium chloride 20 MEQ packet Commonly known as:  KLOR-CON Take 40 mEq by mouth 3 (three) times daily.            Discharge Care Instructions        Start     Ordered   10/27/2016 0000  metoprolol tartrate (LOPRESSOR) 50 MG tablet  Daily     10/28/2016 1139   10/18/2016 0000  nystatin (MYCOSTATIN) 100000 UNIT/ML suspension  4 times daily     10/19/2016 1139   10/24/2016 0000  Increase activity slowly     11/02/2016 1139   10/23/2016 0000  Diet - low sodium heart healthy     10/30/2016 1139   11/01/2016 0000  Call MD for:  temperature >100.4     10/05/2016 1139   11/01/2016 0000  Call MD for:  persistant nausea and vomiting     10/21/2016 1139   10/28/2016 0000  Call MD for:  severe uncontrolled pain     10/13/2016 1139   10/04/2016 0000  Call MD for:  difficulty breathing, headache or visual disturbances     10/31/2016 1139   10/20/2016 0000  Call MD for:  hives     10/10/2016 1139   10/23/2016 0000  Call MD for:  persistant dizziness or light-headedness     10/10/2016 1139   10/27/2016 0000  Call MD for:  extreme fatigue     10/11/2016 1139     Follow-up Information    Twana First, MD. Schedule an appointment as soon as possible for a visit in 1 week(s).   Specialty:  Oncology Why:  Hospital Follow Up  Contact information: [address] [place] [address] [phone]          Allergies  Allergen Reactions  . Morphine And Related Itching   Current Discharge  Medication List    START taking these medications   Details  nystatin (MYCOSTATIN) 100000 UNIT/ML suspension Take 5 mLs (500,000 Units total) by mouth 4 (four) times daily. Qty: 60 mL, Refills: 0      CONTINUE these medications which have CHANGED   Details  metoprolol tartrate (LOPRESSOR) 50 MG tablet Take 1 tablet (50 mg total) by mouth daily.  CONTINUE these medications which have NOT CHANGED   Details  aspirin EC 81 MG tablet Take 81 mg by mouth daily.      HYDROcodone-acetaminophen (NORCO) 5-325 MG tablet Take 1 tablet by mouth every 6 (six) hours as needed for moderate pain. Qty: 30 tablet, Refills: 0    magnesium oxide (MAG-OX) 400 (241.3 Mg) MG tablet Take 2 tablets (800 mg total) by mouth 3 (three) times daily. Qty: 90 tablet, Refills: 6    ondansetron (ZOFRAN) 8 MG tablet Take 1 tablet (8 mg total) by mouth every 4 (four) hours as needed for nausea or vomiting. Qty: 60 tablet, Refills: 1   Associated Diagnoses: Breast mass, left; Nausea and vomiting, intractability of vomiting not specified, unspecified vomiting type    potassium chloride (KLOR-CON) 20 MEQ packet Take 40 mEq by mouth 3 (three) times daily. Qty: 180 packet, Refills: 1        Procedures/Studies: Dg Chest 1 View  Result Date: 10/13/2016 CLINICAL DATA:  Shortness of breath and hypertension. History of breast carcinoma EXAM: CHEST 1 VIEW COMPARISON:  Chest radiograph and chest CT September 04, 2016 FINDINGS: There is a small right pleural effusion. There is mild bibasilar atelectasis. No edema or consolidation. Heart size and pulmonary vascularity are normal. Apparent adenopathy in the right paratracheal region persists, better seen on prior CT. No new adenopathy appreciable by radiography. Patient has had previous mastectomies bilaterally with surgical clips in each axillary region. No bone lesions are appreciable. IMPRESSION: Small right pleural effusion with bibasilar atelectasis. No consolidation. Right  peritracheal region adenopathy, appearing grossly stable and better seen on prior CT. Stable cardiac silhouette. There is aortic atherosclerosis. Surgical clips noted in each axillary region. Aortic Atherosclerosis (ICD10-I70.0). Electronically Signed   By: Lowella Grip III M.D.   On: 10/13/2016 11:36   Nm Pulmonary Perf And Vent  Result Date: 10/13/2016 CLINICAL DATA:  Breast cancer, weakness, loss of appetite, tachycardia, question pulmonary embolism EXAM: NUCLEAR MEDICINE VENTILATION - PERFUSION LUNG SCAN TECHNIQUE: Ventilation images were obtained in multiple projections using inhaled aerosol Tc-11m DTPA. Perfusion images were obtained in multiple projections after intravenous injection of Tc-51m MAA. RADIOPHARMACEUTICALS:  38 mCi Technetium-53m DTPA aerosol inhalation and 5 mCi Technetium-29m MAA IV COMPARISON:  None Radiographic correlation:  Chest radiograph 10/13/2016 FINDINGS: Ventilation: Patchy ventilation throughout both lungs with numerous peripheral areas of subsegmental diminished ventilation. Diminished ventilation at medial RIGHT lower lobe base. Larger area of diminished ventilation at the LEFT upper lobe in a nonsegmental fashion. Perfusion: Mildly diminished perfusion in a nonsegmental fashion in the LEFT upper lobe. Diminished perfusion at the medial RIGHT lower lobe base matching ventilation. No additional segmental or subsegmental perfusion defects identified. COPD changes with RIGHT pleural effusion and bibasilar atelectasis. IMPRESSION: Matching ventilation and perfusion findings as above. Low probability for pulmonary embolism. Electronically Signed   By: Lavonia Dana M.D.   On: 10/13/2016 12:35      Subjective: Pt says she feels a lot better, she has been eating and drinking.    Discharge Exam: Vitals:   10/28/16 2102 10/20/2016 0532  BP: (!) 106/58 (!) 105/55  Pulse: (!) 115 (!) 112  Resp: 18 18  Temp: 98.3 F (36.8 C) 98.2 F (36.8 C)  SpO2: 93% 97%   Vitals:    10/28/16 1741 10/28/16 2005 10/28/16 2102 10/14/2016 0532  BP: 112/67  (!) 106/58 (!) 105/55  Pulse: (!) 110  (!) 115 (!) 112  Resp: 18  18 18   Temp: 98.5 F (36.9 C)  98.3 F (36.8 C) 98.2 F (36.8 C)  TempSrc: Oral  Oral Oral  SpO2: 93% 96% 93% 97%  Weight: 53.5 kg (118 lb)     Height: 5\' 7"  (1.702 m)      General: Pt is alert, awake, not in acute distress, appears emaciated and chronically ill.  Cardiovascular: normal S1/S2 +, no rubs, no gallops Respiratory: CTA bilaterally, no wheezing, no rhonchi Abdominal: Soft, NT, ND, bowel sounds + Extremities: no edema, no cyanosis   The results of significant diagnostics from this hospitalization (including imaging, microbiology, ancillary and laboratory) are listed below for reference.     Microbiology: No results found for this or any previous visit (from the past 240 hour(s)).   Labs: BNP (last 3 results)  Recent Labs  09/05/16 1422  BNP 74.1   Basic Metabolic Panel:  Recent Labs Lab 10/28/16 1351 10/30/2016 0552  NA 145 150*  K 2.6* 3.5  CL 102 108  CO2 28 27  GLUCOSE 144* 114*  BUN 22* 19  CREATININE 0.65 0.58  CALCIUM 9.3 8.8*   Liver Function Tests: No results for input(s): AST, ALT, ALKPHOS, BILITOT, PROT, ALBUMIN in the last 168 hours. No results for input(s): LIPASE, AMYLASE in the last 168 hours. No results for input(s): AMMONIA in the last 168 hours. CBC:  Recent Labs Lab 10/28/16 1351 10/04/2016 0552  WBC 8.9 7.6  HGB 9.1* 8.0*  HCT 29.3* 27.1*  MCV 86.9 89.1  PLT 162 148*   Cardiac Enzymes: No results for input(s): CKTOTAL, CKMB, CKMBINDEX, TROPONINI in the last 168 hours. BNP: Invalid input(s): POCBNP CBG:  Recent Labs Lab 10/28/16 2101 10/22/2016 0746  GLUCAP 121* 106*   D-Dimer No results for input(s): DDIMER in the last 72 hours. Hgb A1c No results for input(s): HGBA1C in the last 72 hours. Lipid Profile No results for input(s): CHOL, HDL, LDLCALC, TRIG, CHOLHDL, LDLDIRECT in the  last 72 hours. Thyroid function studies No results for input(s): TSH, T4TOTAL, T3FREE, THYROIDAB in the last 72 hours.  Invalid input(s): FREET3 Anemia work up No results for input(s): VITAMINB12, FOLATE, FERRITIN, TIBC, IRON, RETICCTPCT in the last 72 hours. Urinalysis    Component Value Date/Time   COLORURINE YELLOW 10/11/2016 0350   APPEARANCEUR CLEAR 10/11/2016 0350   LABSPEC 1.017 10/11/2016 0350   PHURINE 6.0 10/11/2016 0350   GLUCOSEU NEGATIVE 10/11/2016 0350   HGBUR NEGATIVE 10/11/2016 0350   BILIRUBINUR NEGATIVE 10/11/2016 0350   KETONESUR 20 (A) 10/11/2016 0350   PROTEINUR 30 (A) 10/11/2016 0350   UROBILINOGEN 4.0 (H) 11/26/2010 1105   NITRITE NEGATIVE 10/11/2016 0350   LEUKOCYTESUR NEGATIVE 10/11/2016 0350   Sepsis Labs Invalid input(s): PROCALCITONIN,  WBC,  LACTICIDVEN Microbiology No results found for this or any previous visit (from the past 240 hour(s)).  Time coordinating discharge:   SIGNED:  Irwin Brakeman, MD  [facility] 10/19/2016, 11:40 AM Pager [phone]  If 7PM-7AM, please contact night-coverage [email or web address] Password [email or web address]

## 2016-10-29 NOTE — Discharge Instructions (Signed)
Follow with Primary MD  Patient, No Pcp Per  and other consultant's as instructed your Hospitalist MD ° °Please get a complete blood count and chemistry panel checked by your Primary MD at your next visit, and again as instructed by your Primary MD. ° °Get Medicines reviewed and adjusted: °Please take all your medications with you for your next visit with your Primary MD ° °Laboratory/radiological data: °Please request your Primary MD to go over all hospital tests and procedure/radiological results at the follow up, please ask your Primary MD to get all Hospital records sent to his/her office. ° °In some cases, they will be blood work, cultures and biopsy results pending at the time of your discharge. Please request that your primary care M.D. follows up on these results. ° °Also Note the following: °If you experience worsening of your admission symptoms, develop shortness of breath, life threatening emergency, suicidal or homicidal thoughts you must seek medical attention immediately by calling 911 or calling your MD immediately  if symptoms less severe. ° °You must read complete instructions/literature along with all the possible adverse reactions/side effects for all the Medicines you take and that have been prescribed to you. Take any new Medicines after you have completely understood and accpet all the possible adverse reactions/side effects.  ° °Do not drive when taking Pain medications or sleeping medications (Benzodaizepines) ° °Do not take more than prescribed Pain, Sleep and Anxiety Medications. It is not advisable to combine anxiety,sleep and pain medications without talking with your primary care practitioner ° °Special Instructions: If you have smoked or chewed Tobacco  in the last 2 yrs please stop smoking, stop any regular Alcohol  and or any Recreational drug use. ° °Wear Seat belts while driving. ° °Please note: °You were cared for by a hospitalist during your hospital stay. Once you are discharged,  your primary care physician will handle any further medical issues. Please note that NO REFILLS for any discharge medications will be authorized once you are discharged, as it is imperative that you return to your primary care physician (or establish a relationship with a primary care physician if you do not have one) for your post hospital discharge needs so that they can reassess your need for medications and monitor your lab values. ° ° ° ° °

## 2016-10-30 ENCOUNTER — Encounter (HOSPITAL_COMMUNITY): Payer: Self-pay | Admitting: Primary Care

## 2016-10-30 ENCOUNTER — Ambulatory Visit: Payer: Medicare HMO | Admitting: General Surgery

## 2016-10-30 DIAGNOSIS — E870 Hyperosmolality and hypernatremia: Secondary | ICD-10-CM

## 2016-10-30 DIAGNOSIS — Z7189 Other specified counseling: Secondary | ICD-10-CM

## 2016-10-30 DIAGNOSIS — I10 Essential (primary) hypertension: Secondary | ICD-10-CM

## 2016-10-30 DIAGNOSIS — K922 Gastrointestinal hemorrhage, unspecified: Secondary | ICD-10-CM

## 2016-10-30 DIAGNOSIS — Z515 Encounter for palliative care: Secondary | ICD-10-CM

## 2016-10-30 LAB — BASIC METABOLIC PANEL
Anion gap: 12 (ref 5–15)
BUN: 15 mg/dL (ref 6–20)
CALCIUM: 8.8 mg/dL — AB (ref 8.9–10.3)
CHLORIDE: 110 mmol/L (ref 101–111)
CO2: 25 mmol/L (ref 22–32)
CREATININE: 0.41 mg/dL — AB (ref 0.44–1.00)
GFR calc non Af Amer: >60 mL/min (ref >60)
Glucose, Bld: 127 mg/dL — ABNORMAL HIGH (ref 65–99)
Potassium: 3.4 mmol/L — ABNORMAL LOW (ref 3.5–5.1)
SODIUM: 147 mmol/L — AB (ref 135–145)

## 2016-10-30 LAB — CBC
HCT: 25.9 % — ABNORMAL LOW (ref 36.0–46.0)
HEMATOCRIT: 24.6 % — AB (ref 36.0–46.0)
HEMATOCRIT: 25.5 % — AB (ref 36.0–46.0)
HEMOGLOBIN: 7.8 g/dL — AB (ref 12.0–15.0)
HEMOGLOBIN: 8.1 g/dL — AB (ref 12.0–15.0)
Hemoglobin: 7.7 g/dL — ABNORMAL LOW (ref 12.0–15.0)
MCH: 26.3 pg (ref 26.0–34.0)
MCH: 26.5 pg (ref 26.0–34.0)
MCH: 26.7 pg (ref 26.0–34.0)
MCHC: 31.3 g/dL (ref 30.0–36.0)
MCHC: 30.6 g/dL (ref 30.0–36.0)
MCHC: 31.3 g/dL (ref 30.0–36.0)
MCV: 85.5 fL (ref 78.0–100.0)
MCV: 84.5 fL (ref 78.0–100.0)
MCV: 85.9 fL (ref 78.0–100.0)
PLATELETS: 133 10*3/uL — AB (ref 150–400)
Platelets: 141 10*3/uL — ABNORMAL LOW (ref 150–400)
Platelets: 138 10*3/uL — ABNORMAL LOW (ref 150–400)
RBC: 2.91 MIL/uL — ABNORMAL LOW (ref 3.87–5.11)
RBC: 3.03 MIL/uL — ABNORMAL LOW (ref 3.87–5.11)
RBC: 2.97 MIL/uL — AB (ref 3.87–5.11)
RDW: 17.7 % — ABNORMAL HIGH (ref 11.5–15.5)
RDW: 17.6 % — AB (ref 11.5–15.5)
RDW: 17.7 % — AB (ref 11.5–15.5)
WBC: 8.2 10*3/uL (ref 4.0–10.5)
WBC: 8 10*3/uL (ref 4.0–10.5)
WBC: 8.5 10*3/uL (ref 4.0–10.5)

## 2016-10-30 LAB — IRON AND TIBC
IRON: 65 ug/dL (ref 28–170)
Saturation Ratios: 36 % — ABNORMAL HIGH (ref 10.4–31.8)
TIBC: 181 ug/dL — ABNORMAL LOW (ref 250–450)
UIBC: 116 ug/dL

## 2016-10-30 LAB — RETICULOCYTES
RBC.: 3.03 MIL/uL — ABNORMAL LOW (ref 3.87–5.11)
RETIC COUNT ABSOLUTE: 81.8 10*3/uL (ref 19.0–186.0)
Retic Ct Pct: 2.7 % (ref 0.4–3.1)

## 2016-10-30 LAB — FOLATE: Folate: 2.1 ng/mL — ABNORMAL LOW (ref >5.9)

## 2016-10-30 LAB — VITAMIN B12: Vitamin B-12: 285 pg/mL (ref 180–914)

## 2016-10-30 LAB — FERRITIN: FERRITIN: 1203 ng/mL — AB (ref 11–307)

## 2016-10-30 MED ORDER — BOOST / RESOURCE BREEZE PO LIQD
1.0000 | Freq: Three times a day (TID) | ORAL | Status: DC
  Administered 2016-10-30 – 2016-10-31 (×3): 1 via ORAL

## 2016-10-30 NOTE — Consult Note (Signed)
Consultation Note Date: 10/30/2016   Patient Name: [name]  DOB: [birth year]  MRN: [id number]  Age / Sex: 71 y.o., female  PCP: Patient, No Pcp Per Referring Physician: Donne Hazel, MD  Reason for Consultation: Establishing goals of care and Psychosocial/spiritual support  HPI/Patient Profile: 71 y.o. female  with past medical history of hypertension, remote breast cancer, new diagnosis of mediastinal mass with esophageal displacement admitted on 10/22/2016 with likely diverticulum bleed.   Clinical Assessment and Goals of Care: [name] is resting quietly in bed. She greets me making and keeping eye contact.  There is no family at bedside at this time.  She has no complaints of pain, but tells me that she is hungry. We talk about her clear liquid diet today, which is later advanced by hospitalist. [name] is able to tell me her name, but when I ask where we are she tells me [facility], but then states we are in [facility]. I ask for the month, and she tells me she believes it's February. She has a history of confusion. This is also endorsed by her niece, [name], she tells me that her aunt has been confused for the last few months. [name] comes to visit usually twice per month. I asked [name] about her home life. She tells me that a neighbor, [name], helps her with some household tasks and bathing at times. She is a poor historian. I ask about her children, and she names [name], and searches for another name for several seconds, but is unable to give me her child's name. I call to Niece [name] later, she tells me that [name] only has one child, [name].   Call to daughter listed in chart, [name], voice recorder states [place] residents, generic voicemail message left. Call to church friend, [name], generic voicemail message left.  Call to niece, [name]  who lives in [place], [place]. [name] states that she has been also trying to reach [name] daughter [name]. She has been unable to reach her even via text. [name] endorses that [name] has been confused for the last few months, stating "she wasn't like this in May". [name] states that [name] has no husband and only one child, [name] is [name]'s child.   I share with [name] the severity of [name] cancer, her weight loss, and how her mediastinal cancer has displaced her esophagus, causing difficulty swallowing at times.  We discussed the treatment plan. I share that oncology is not recommending systemic chemotherapy due to weakness and frailty. Also share that per oncology notes [name] declined to take systemic chemotherapy August 2018.   I asked [name] what she sees as next for [name]. [name] states that her aunt "wants to get better". [name] states that her goal is for [place] to "fix it, make her better". I share with [name], "with loving kindness, we can't change her cancer". I share my concerns about safety in the home, and [name] agrees. She states  that [name] has already wandered away from home, and she's also concerned about taking medications and self-care.  We talk about Medicaid, [name] states that approximate 5 years ago she helped her aunt apply for Medicaid and food stamps but she did not qualify. [name] states she does not know her aunt's monthly income. We talk about SNF for rehab, also possible SNF residential placement for safety in the future. We talk about the benefits of hospice. [name] is tearful at times. We talk about the concepts of treat the treatable but no shortening measures. We talk about the realities of CPR, how this will not change cancer frailty. We talk about comfort and dignity. [name] states that her aunt has told her that she is "tired of being stuck and being in pain". [name] states that her goal is comfort and dignity for her aunt. She states she  would like to allow a natural death (DNR).  [name] is tearful, asking if she has done enough for her aunt. I encourage her that she has cared for her aunt with love.  Niece [name] is open to rehab/SNF residential in the future if qualified.  Niece [name] is also open to hospice in the future. Prognosis and hospice not discussed with patient due to her memory loss/confusion.  [name] is one of 4 natural quintuplets born at [facility]. [name] shares that all of [name] quintuplets sisters have passed away with cancer. She states that her mother died in 1995-05-19 with hospice.   They each had the first name [facility].  [name] goes by [name].  Healthcare power of attorney NEXT OF KIN - Niece, [name] in [place], [place] [address], states that she comes to [place] to visit her aunt usually twice per month. She shares that daughter [name] has not seen her mother in 88 years. [name] states that she has encouraged [name] to come and visit multiple times. 1 child, daughter, [name]. Unable to reach after multiple calls.  SUMMARY OF RECOMMENDATIONS   Continue to treat the treatable but no extraordinary measures such as CPR or intubation. Family is open to rehab/SNF residential in the future if qualified. Family is also open to hospice in the future.  Code Status/Advance Care Planning:  DNR - [name] and I discussed the concepts of treat the treatable, but allow a natural death. She states that this is what she would like for her beloved aunt. She states that she would like to focus on comfort and dignity at end-of-life.  Symptom Management:   Per hospitalist, no additional needs at this time.  Palliative Prophylaxis:   Bowel Regimen and Frequent Pain Assessment  Additional Recommendations (Limitations, Scope, Preferences):  continue to treat the treatable but no CPR, no intubation.  Psycho-social/Spiritual:   Desire for further Chaplaincy support:no  Additional  Recommendations: Caregiving  Support/Resources  Prognosis:   < 3 months would not be surprising based on frailty, functional decline, metastatic cancer burden and patience desire to not take systemic chemotherapy. Three-month prognosis is also endorsed by oncologist.  Discharge Planning: Goal is SNF for rehab, with the possibility of residential SNF placement.      Primary Diagnoses: Present on Admission: . Acute GI bleeding . Essential hypertension . Acute blood loss anemia . Hypernatremia   I have reviewed the medical record, interviewed the patient and family, and examined the patient. The following aspects are pertinent.  Past Medical History:  Diagnosis Date  . Breast cancer ([place])  many years ago  . Hypertension    Social History   Social History  . Marital status: Legally Separated    Spouse name: N/A  . Number of children: N/A  . Years of education: N/A   Occupational History  . Nurses Aid     [employer]   Social History Main Topics  . Smoking status: Former Smoker    Quit date: 11/21/2010  . Smokeless tobacco: Never Used  . Alcohol use No  . Drug use: No  . Sexual activity: Not Asked   Other Topics Concern  . None   Social History Narrative   Lives in [place] things are her 5 grandchildren (4 girls and a boy), and cooking for family. Cornbread is her specialty; "secret is a dash of vanilla and a pinch of sugar - tastes like a cake."      Family History  Problem Relation Age of Onset  . Congestive Heart Failure Mother   . Breast cancer Father   . Anesthesia problems Neg Hx    Scheduled Meds: . feeding supplement  1 Container Oral TID BM   Continuous Infusions: . dextrose 1,000 mL (10/30/16 0041)   PRN Meds:.acetaminophen **OR** acetaminophen, ondansetron **OR** ondansetron (ZOFRAN) IV Medications Prior to Admission:  Prior to Admission medications   Medication Sig Start Date End Date Taking? Authorizing Provider  aspirin EC 81 MG  tablet Take 81 mg by mouth daily.     Yes [provider]  HYDROcodone-acetaminophen (NORCO) 5-325 MG tablet Take 1 tablet by mouth every 6 (six) hours as needed for moderate pain. 10/15/16  Yes Aviva Signs, MD  magnesium oxide (MAG-OX) 400 (241.3 Mg) MG tablet Take 2 tablets (800 mg total) by mouth 3 (three) times daily. 10/17/16  Yes Reyne Dumas, MD  metoprolol tartrate (LOPRESSOR) 50 MG tablet Take 1 tablet (50 mg total) by mouth daily. 10/28/2016  Yes Johnson, Clanford L, MD  ondansetron (ZOFRAN) 8 MG tablet Take 1 tablet (8 mg total) by mouth every 4 (four) hours as needed for nausea or vomiting. 09/23/16  Yes Twana First, MD  potassium chloride (KLOR-CON) 20 MEQ packet Take 40 mEq by mouth 3 (three) times daily. 10/17/16 11/16/16 Yes Reyne Dumas, MD  nystatin (MYCOSTATIN) 100000 UNIT/ML suspension Take 5 mLs (500,000 Units total) by mouth 4 (four) times daily. 10/23/2016   Murlean Iba, MD   Allergies  Allergen Reactions  . Morphine And Related Itching   Review of Systems  Unable to perform ROS: Mental status change    Physical Exam  Constitutional: No distress.  Appears frail and thin, chronically ill  HENT:  Head: Atraumatic.  Temporal wasting  Cardiovascular: Normal rate and regular rhythm.   Pulmonary/Chest: Effort normal. No respiratory distress.  Abdominal: Soft. She exhibits no distension.  Musculoskeletal: She exhibits no edema.  Muscle wasting  Neurological: She is alert.  Oriented to self only. Knows that we are in [facility] hospital. States the month is February.  Skin: Skin is warm and dry.  Nursing note and vitals reviewed.   Vital Signs: BP 129/81 (BP Location: Left Arm)   Pulse (!) 124   Temp 98.3 F (36.8 C) (Oral)   Resp (!) 24   Ht 5\' 7"  (1.702 m)   Wt 53.3 kg (117 lb 8.1 oz)   SpO2 94%   BMI 18.40 kg/m  Pain Assessment: No/denies pain   Pain Score: 0-No pain (denies pain but states back hurts when moving  in  bed)   SpO2: SpO2: 94 % O2 Device:SpO2: 94 % O2 Flow Rate: .   IO: Intake/output summary: No intake or output data in the 24 hours ending 10/30/16 1105  LBM: Last BM Date: 10/04/2016 Baseline Weight: Weight: 53.3 kg (117 lb 8.1 oz) Most recent weight: Weight: 53.3 kg (117 lb 8.1 oz)     Palliative Assessment/Data:   Flowsheet Rows     Most Recent Value  Intake Tab  Referral Department  Hospitalist  Unit at Time of Referral  Cardiac/Telemetry Unit  Palliative Care Primary Diagnosis  Other (Comment)  Date Notified  10/30/16  Palliative Care Type  New Palliative care  Date of Admission  10/30/16  Date first seen by Palliative Care  10/30/16  # of days Palliative referral response time  0 Day(s)  # of days IP prior to Palliative referral  0  Clinical Assessment  Palliative Performance Scale Score  40%  Pain Max last 24 hours  Not able to report  Pain Min Last 24 hours  Not able to report  Dyspnea Max Last 24 Hours  Not able to report  Dyspnea Min Last 24 hours  Not able to report  Psychosocial & Spiritual Assessment  Palliative Care Outcomes  Patient/Family meeting held?  Yes  Who was at the meeting?  patient at bedside      Time In: 1105 Time Out: 1225 Time Total: 80 minutes Greater than 50%  of this time was spent counseling and coordinating care related to the above assessment and plan.  Signed by: Drue Novel, NP   Please contact Palliative Medicine Team phone at [phone] for questions and concerns.  For individual provider: See Shea Evans

## 2016-10-30 NOTE — Progress Notes (Signed)
PT Cancellation Note  Patient Details Name: [name] MRN: [id number] DOB: Apr 16, 1945   Cancelled Treatment:    Reason Eval/Treat Not Completed: Other (comment) (Pt speaking with Chaplain; will check back as schedule permits.)   Sinan Tuch,KATHrine E 10/30/2016, 3:46 PM Carmelia Bake, PT, DPT 10/30/2016 Pager: [phone]

## 2016-10-30 NOTE — Progress Notes (Signed)
PROGRESS NOTE    [name]  ZOX:[id number] DOB: 11/09/1945 DOA: 10/25/2016 PCP: Patient, No Pcp Per    Brief Narrative:  71 y.o. female with history of metastatic breast cancer was a recent recurrence who was admitted to [facility] for dehydration and was discharged yesterday and was taken home. At home patient's friend found that patient was weak and not doing well. Parents also noticed that patient had some blood in the underpants.  Assessment & Plan:   Principal Problem:   Acute GI bleeding Active Problems:   Essential hypertension   Non-ischemic cardiomyopathy (HCC)   Acute blood loss anemia   Hypernatremia   Goals of care, counseling/discussion   Encounter for hospice care discussion   1. Possible acute GI bleed  1. Hgb has remained stable overnight. No signs of continued bleed. Pt denies abd pain 2. Have discussed case with gastroenterology. Recommendations to continue to monitor closely. If signs of rebleeding, then would formally consult gastroenterology at that time. 3. Repeat CBC in the morning 2. Dehydration with hypernatremia 1. Patient is continued on IV fluid hydration. 3. Metastatic breast cancer 1. was evaluated by oncologist 1 day prior to admission and recommended no systemic therapy given the patient's functional status.  2. Palliative care consulted, appreciate input. 3. Have consulted PT/OT for possible placement 4. History of nonischemic cardiomyopathy 1. Stable at this time . 5. Hypertension 1. Continue on metoprolol.  DVT prophylaxis: SCD's Code Status: DNR Family Communication: Pt in room, family not at bedsie Disposition Plan: Uncertain at this time  Consultants:   Palliative Care  Discussed case with GI  Procedures:     Antimicrobials: Anti-infectives    None       Subjective: No abd pain. No complaints  Objective: Vitals:   10/23/2016 2234 10/18/2016 2354 10/30/16 0654 10/30/16 1417  BP: 121/73 113/68 129/81 107/65   Pulse: (!) 121 (!) 119 (!) 124 (!) 117  Resp: (!) 27  (!) 24 17  Temp:  98.7 F (37.1 C) 98.3 F (36.8 C) 98.5 F (36.9 C)  TempSrc:  Oral Oral Oral  SpO2: 94% 92% 94% 97%  Weight: 53.3 kg (117 lb 8.1 oz)     Height: 5\' 7"  (1.702 m)       Intake/Output Summary (Last 24 hours) at 10/30/16 1548 Last data filed at 10/30/16 1422  Gross per 24 hour  Intake               90 ml  Output                0 ml  Net               90 ml   Filed Weights   10/15/2016 2234  Weight: 53.3 kg (117 lb 8.1 oz)    Examination:  General exam: Appears calm and comfortable  Respiratory system: Clear to auscultation. Respiratory effort normal. Cardiovascular system: S1 & S2 heard, RRR Gastrointestinal system: Abdomen is nondistended, soft and nontender. No organomegaly or masses felt. Normal bowel sounds heard. Central nervous system: Alert and oriented. No focal neurological deficits. Extremities: Symmetric 5 x 5 power. Skin: No rashes, lesions Psychiatry: Judgement and insight appear normal. Mood & affect appropriate.   Data Reviewed: I have personally reviewed following labs and imaging studies  CBC:  Recent Labs Lab 11/02/2016 0552 10/23/2016 2019 10/30/16 0014 10/30/16 0535 10/30/16 0812  WBC 7.6 8.3 8.2 8.5 8.0  NEUTROABS  --  6.1  --   --   --  HGB 8.0* 7.7* 7.8* 7.7* 8.1*  HCT 27.1* 24.6* 25.5* 24.6* 25.9*  MCV 89.1 86.0 85.9 84.5 85.5  PLT 148* 152 141* 133* 103*   Basic Metabolic Panel:  Recent Labs Lab 10/28/16 1351 10/19/2016 0552 10/17/2016 2019 10/30/16 0535  NA 145 150* 148* 147*  K 2.6* 3.5 3.3* 3.4*  CL 102 108 110 110  CO2 28 27 26 25   GLUCOSE 144* 114* 119* 127*  BUN 22* 19 17 15   CREATININE 0.65 0.58 0.50 0.41*  CALCIUM 9.3 8.8* 9.0 8.8*   GFR: Estimated Creatinine Clearance: 53.5 mL/min (A) (by C-G formula based on SCr of 0.41 mg/dL (L)). Liver Function Tests: No results for input(s): AST, ALT, ALKPHOS, BILITOT, PROT, ALBUMIN in the last 168 hours. No  results for input(s): LIPASE, AMYLASE in the last 168 hours. No results for input(s): AMMONIA in the last 168 hours. Coagulation Profile: No results for input(s): INR, PROTIME in the last 168 hours. Cardiac Enzymes: No results for input(s): CKTOTAL, CKMB, CKMBINDEX, TROPONINI in the last 168 hours. BNP (last 3 results) No results for input(s): PROBNP in the last 8760 hours. HbA1C: No results for input(s): HGBA1C in the last 72 hours. CBG:  Recent Labs Lab 10/28/16 2101 10/19/2016 0746 10/31/2016 1147  GLUCAP 121* 106* 149*   Lipid Profile: No results for input(s): CHOL, HDL, LDLCALC, TRIG, CHOLHDL, LDLDIRECT in the last 72 hours. Thyroid Function Tests: No results for input(s): TSH, T4TOTAL, FREET4, T3FREE, THYROIDAB in the last 72 hours. Anemia Panel:  Recent Labs  10/30/16 0812  VITAMINB12 285  FOLATE 2.1*  FERRITIN 1,203*  TIBC 181*  IRON 65  RETICCTPCT 2.7   Sepsis Labs: No results for input(s): PROCALCITON, LATICACIDVEN in the last 168 hours.  No results found for this or any previous visit (from the past 240 hour(s)).   Radiology Studies: No results found.  Scheduled Meds: . feeding supplement  1 Container Oral TID BM   Continuous Infusions: . dextrose 75 mL/hr at 10/30/16 1318     LOS: 1 day   Suriya Kovarik, Orpah Melter, MD [facility] Pager [phone]  If 7PM-7AM, please contact night-coverage [email or web address] Password [facility] 10/30/2016, 3:48 PM

## 2016-10-31 ENCOUNTER — Inpatient Hospital Stay (HOSPITAL_COMMUNITY): Payer: Medicare HMO

## 2016-10-31 ENCOUNTER — Encounter (HOSPITAL_COMMUNITY): Payer: Self-pay | Admitting: Radiology

## 2016-10-31 DIAGNOSIS — G463 Brain stem stroke syndrome: Secondary | ICD-10-CM

## 2016-10-31 DIAGNOSIS — D62 Acute posthemorrhagic anemia: Secondary | ICD-10-CM

## 2016-10-31 DIAGNOSIS — C801 Malignant (primary) neoplasm, unspecified: Secondary | ICD-10-CM

## 2016-10-31 DIAGNOSIS — R4182 Altered mental status, unspecified: Secondary | ICD-10-CM

## 2016-10-31 DIAGNOSIS — D649 Anemia, unspecified: Secondary | ICD-10-CM

## 2016-10-31 LAB — COMPREHENSIVE METABOLIC PANEL
ALBUMIN: 2.5 g/dL — AB (ref 3.5–5.0)
ALT: 19 U/L (ref 14–54)
ANION GAP: 10 (ref 5–15)
AST: 26 U/L (ref 15–41)
Alkaline Phosphatase: 228 U/L — ABNORMAL HIGH (ref 38–126)
BUN: 12 mg/dL (ref 6–20)
CO2: 27 mmol/L (ref 22–32)
Calcium: 8.6 mg/dL — ABNORMAL LOW (ref 8.9–10.3)
Chloride: 104 mmol/L (ref 101–111)
Creatinine, Ser: 0.45 mg/dL (ref 0.44–1.00)
GFR calc Af Amer: >60 mL/min (ref >60)
GFR calc non Af Amer: >60 mL/min (ref >60)
GLUCOSE: 114 mg/dL — AB (ref 65–99)
POTASSIUM: 3.1 mmol/L — AB (ref 3.5–5.1)
SODIUM: 141 mmol/L (ref 135–145)
Total Bilirubin: 0.9 mg/dL (ref 0.3–1.2)
Total Protein: 5.7 g/dL — ABNORMAL LOW (ref 6.5–8.1)

## 2016-10-31 LAB — CBC
HEMATOCRIT: 24.1 % — AB (ref 36.0–46.0)
HEMATOCRIT: 23.7 % — AB (ref 36.0–46.0)
HEMOGLOBIN: 7.6 g/dL — AB (ref 12.0–15.0)
HEMOGLOBIN: 7.5 g/dL — AB (ref 12.0–15.0)
MCH: 26 pg (ref 26.0–34.0)
MCH: 26.5 pg (ref 26.0–34.0)
MCHC: 31.5 g/dL (ref 30.0–36.0)
MCHC: 31.6 g/dL (ref 30.0–36.0)
MCV: 82.5 fL (ref 78.0–100.0)
MCV: 83.7 fL (ref 78.0–100.0)
PLATELETS: 135 10*3/uL — AB (ref 150–400)
Platelets: 128 10*3/uL — ABNORMAL LOW (ref 150–400)
RBC: 2.92 MIL/uL — ABNORMAL LOW (ref 3.87–5.11)
RBC: 2.83 MIL/uL — AB (ref 3.87–5.11)
RDW: 17.2 % — ABNORMAL HIGH (ref 11.5–15.5)
RDW: 17.2 % — ABNORMAL HIGH (ref 11.5–15.5)
WBC: 7.6 10*3/uL (ref 4.0–10.5)
WBC: 8.3 10*3/uL (ref 4.0–10.5)

## 2016-10-31 MED ORDER — POTASSIUM CHLORIDE CRYS ER 20 MEQ PO TBCR
40.0000 meq | EXTENDED_RELEASE_TABLET | Freq: Two times a day (BID) | ORAL | Status: DC
  Filled 2016-10-31 (×2): qty 2

## 2016-10-31 MED ORDER — IOPAMIDOL (ISOVUE-300) INJECTION 61%
100.0000 mL | Freq: Once | INTRAVENOUS | Status: AC | PRN
  Administered 2016-10-31: 100 mL via INTRAVENOUS

## 2016-10-31 MED ORDER — IOPAMIDOL (ISOVUE-300) INJECTION 61%
INTRAVENOUS | Status: AC
  Filled 2016-10-31: qty 30

## 2016-10-31 MED ORDER — METOPROLOL TARTRATE 25 MG PO TABS
12.5000 mg | ORAL_TABLET | Freq: Two times a day (BID) | ORAL | Status: DC
  Administered 2016-10-31: 12.5 mg via ORAL
  Filled 2016-10-31: qty 1

## 2016-10-31 MED ORDER — IOPAMIDOL (ISOVUE-370) INJECTION 76%
INTRAVENOUS | Status: AC
  Filled 2016-10-31: qty 100

## 2016-10-31 MED ORDER — IOPAMIDOL (ISOVUE-300) INJECTION 61%: 15.0000 mL | Freq: Two times a day (BID) | INTRAVENOUS | Status: DC | PRN

## 2016-10-31 MED ORDER — POTASSIUM CHLORIDE CRYS ER 10 MEQ PO TBCR
40.0000 meq | EXTENDED_RELEASE_TABLET | Freq: Two times a day (BID) | ORAL | Status: AC
  Filled 2016-10-31: qty 4

## 2016-10-31 MED ORDER — IOPAMIDOL (ISOVUE-300) INJECTION 61%
INTRAVENOUS | Status: AC
  Filled 2016-10-31: qty 100

## 2016-10-31 NOTE — Progress Notes (Signed)
PROGRESS NOTE    [name]  BMW:[id number] DOB: 1945/08/01 DOA: 10/12/2016 PCP: Patient, No Pcp Per    Brief Narrative:  71 y.o. female with history of metastatic breast cancer was a recent recurrence who was admitted to [facility] for dehydration and was discharged yesterday and was taken home. At home patient's friend found that patient was weak and not doing well. Parents also noticed that patient had some blood in the underpants.  Assessment & Plan:   Principal Problem:   Acute GI bleeding Active Problems:   Essential hypertension   Non-ischemic cardiomyopathy (HCC)   Acute blood loss anemia   Hypernatremia   Goals of care, counseling/discussion   Encounter for hospice care discussion   1. BRBPR/ GI bleed  1. Hgb has remained overall stable, denying abdominal pain 2. No further episodes of bright red blood per rectum until this morning. Nursing staff reports patient passing bright red blood with clots. 3. Will formally consult gastroenterology for any further recommendations at this time. 4. Repeat CBC in the morning 2. Dehydration with hypernatremia 1. Patient is continued on IV fluid hydration. 2. Stable at this time 3. Metastatic breast cancer 1. was evaluated by oncologist 1 day prior to admission and recommended no systemic therapy given the patient's functional status.  2. Palliative care consulted, appreciate input. 3. Have consulted PT/OT for possible placement, pending 4. History of nonischemic cardiomyopathy 1. Remains stable currently 5. Hypertension 1. Continue on metoprolol with hold parameters  DVT prophylaxis: SCD's Code Status: DNR Family Communication: Pt in room, family not at bedsie Disposition Plan: Uncertain at this time  Consultants:   Palliative Care  gastroenterology  Procedures:     Antimicrobials: Anti-infectives    None      Subjective: Without complaints this morning  Objective: Vitals:   10/31/16 0649  10/31/16 0845 10/31/16 0935 10/31/16 1100  BP: (!) 93/52 104/64 (!) 95/59 109/67  Pulse: (!) 117 (!) 119  (!) 114  Resp: 18     Temp: 99.1 F (37.3 C)   99.5 F (37.5 C)  TempSrc: Oral   Oral  SpO2: 94%   93%  Weight:      Height:        Intake/Output Summary (Last 24 hours) at 10/31/16 1421 Last data filed at 10/31/16 0600  Gross per 24 hour  Intake              390 ml  Output                0 ml  Net              390 ml   Filed Weights   10/15/2016 2234  Weight: 53.3 kg (117 lb 8.1 oz)    Examination: General exam: Awake, laying in bed, in nad Respiratory system: Normal respiratory effort, no wheezing Cardiovascular system: regular rate, s1, s2 Gastrointestinal system: Soft, nondistended, positive BS Central nervous system: CN2-12 grossly intact, strength intact Extremities: Perfused, no clubbing Skin: Normal skin turgor, no notable skin lesions seen Psychiatry: Mood normal // no visual hallucinations    Data Reviewed: I have personally reviewed following labs and imaging studies  CBC:  Recent Labs Lab 10/09/2016 2019 10/30/16 0014 10/30/16 0535 10/30/16 0812 10/31/16 0445  WBC 8.3 8.2 8.5 8.0 7.6  NEUTROABS 6.1  --   --   --   --   HGB 7.7* 7.8* 7.7* 8.1* 7.6*  HCT 24.6* 25.5* 24.6* 25.9* 24.1*  MCV 86.0  85.9 84.5 85.5 82.5  PLT 152 141* 133* 138* 616*   Basic Metabolic Panel:  Recent Labs Lab 10/28/16 1351 10/31/2016 0552 10/19/2016 2019 10/30/16 0535 10/31/16 0445  NA 145 150* 148* 147* 141  K 2.6* 3.5 3.3* 3.4* 3.1*  CL 102 108 110 110 104  CO2 28 27 26 25 27   GLUCOSE 144* 114* 119* 127* 114*  BUN 22* 19 17 15 12   CREATININE 0.65 0.58 0.50 0.41* 0.45  CALCIUM 9.3 8.8* 9.0 8.8* 8.6*   GFR: Estimated Creatinine Clearance: 53.5 mL/min (by C-G formula based on SCr of 0.45 mg/dL). Liver Function Tests:  Recent Labs Lab 10/31/16 0445  AST 26  ALT 19  ALKPHOS 228*  BILITOT 0.9  PROT 5.7*  ALBUMIN 2.5*   No results for input(s): LIPASE,  AMYLASE in the last 168 hours. No results for input(s): AMMONIA in the last 168 hours. Coagulation Profile: No results for input(s): INR, PROTIME in the last 168 hours. Cardiac Enzymes: No results for input(s): CKTOTAL, CKMB, CKMBINDEX, TROPONINI in the last 168 hours. BNP (last 3 results) No results for input(s): PROBNP in the last 8760 hours. HbA1C: No results for input(s): HGBA1C in the last 72 hours. CBG:  Recent Labs Lab 10/28/16 2101 11/02/2016 0746 10/08/2016 1147  GLUCAP 121* 106* 149*   Lipid Profile: No results for input(s): CHOL, HDL, LDLCALC, TRIG, CHOLHDL, LDLDIRECT in the last 72 hours. Thyroid Function Tests: No results for input(s): TSH, T4TOTAL, FREET4, T3FREE, THYROIDAB in the last 72 hours. Anemia Panel:  Recent Labs  10/30/16 0812  VITAMINB12 285  FOLATE 2.1*  FERRITIN 1,203*  TIBC 181*  IRON 65  RETICCTPCT 2.7   Sepsis Labs: No results for input(s): PROCALCITON, LATICACIDVEN in the last 168 hours.  No results found for this or any previous visit (from the past 240 hour(s)).   Radiology Studies: No results found.  Scheduled Meds: . feeding supplement  1 Container Oral TID BM  . metoprolol tartrate  12.5 mg Oral BID  . potassium chloride  40 mEq Oral BID   Continuous Infusions:    LOS: 2 days   CHIU, Orpah Melter, MD [facility] Pager [phone]  If 7PM-7AM, please contact night-coverage [email or web address] Password [email or web address] 10/31/2016, 2:21 PM

## 2016-10-31 NOTE — Consult Note (Signed)
Consultation  Referring Provider:  [facility] hospitalist/ Dr Earlie Counts Primary Care Physician:  Patient, No Pcp Per Primary Gastroenterologist:  none  Reason for Consultation:  GI bleeding  HPI: [name] is a 71 y.o. female who was admitted through the emergency room last night after brought in by a neighbor with complaints of increased weakness and had also noted some blood in the patient's underwear. Patient had maroonish stool in the rectum per ER M.D. She was not hypotensive but was tachycardic. Patient had hemoglobin of 8.6  -2 weeks ago, last evening hemoglobin 7.8., Today hemoglobin 7.6 Per nursing staff she has had 1 small stool this morning which did appear bloody with some small clots.  Patient is unable to give me any meaningful history. She is trying to talk but her speech is garbled. This is new this afternoon. Patient had a recent admission about 2 weeks ago with complaints of nausea and vomiting. She has just recently been diagnosed with widely metastatic breast cancer. She underwent biopsy of the left chest wall on 10/15/2016 which showed metastatic poorly differentiated carcinoma. CT angiogram of the chest done 09/05/2016 shows interval development of multiple large mediastinal and left hilar masses suspicious for metastatic adenopathy, interval multiple sclerotic vertebral masses, the T7 metastases also has paraspinal extension of tumor anteriorly. The large superior right mediastinal mass is displacing the trachea and esophagus to the left, there is also interval development of left superior mediastinal mass measuring 3.2 x 2.8 cm partially encasing the great vessels and a retrotracheal mass some central and peripheral air or gas. The esophagus is difficult to separate from some of these masses.  Patient has been seen by palliative care, per their notes she had previously declined chemotherapy. There is a note from oncology from 10/04/2016 stating that she would not likely be a  candidate for chemotherapy and the recommendation was for hospice at this time. Patient has 1 niece who lives in [place], palliative care has spoken with her and her niece per notes is in agreement with comfort care.  Patient cannot tell me if she has had prior colonoscopy, she had told the hospitalist earlier today that she had one 5-10 years ago. Patient is unaware that she has been having bleeding. She denied any abdominal pain or nausea.     Past Medical History:  Diagnosis Date  . Breast cancer ([place])    many years ago  . Hypertension     Past Surgical History:  Procedure Laterality Date  . MASTECTOMY     bilateral  . SKIN BIOPSY Left 10/15/2016   Procedure: BIOPSY LEFT CHEST WALL MASS;  Surgeon: Aviva Signs, MD;  Location: [facility];  Service: General;  Laterality: Left;  . TEE WITHOUT CARDIOVERSION  11/29/2010   Procedure: TRANSESOPHAGEAL ECHOCARDIOGRAM (TEE);  Surgeon: Cristopher Estimable. Lattie Haw, MD;  Location: [facility];  Service: Cardiovascular;  Laterality: N/A;  . tumor removal from hip      Prior to Admission medications   Medication Sig Start Date End Date Taking? Authorizing Provider  aspirin EC 81 MG tablet Take 81 mg by mouth daily.     Yes [provider]  HYDROcodone-acetaminophen (NORCO) 5-325 MG tablet Take 1 tablet by mouth every 6 (six) hours as needed for moderate pain. 10/15/16  Yes Aviva Signs, MD  magnesium oxide (MAG-OX) 400 (241.3 Mg) MG tablet Take 2 tablets (800 mg total) by mouth 3 (three) times daily. 10/17/16  Yes Reyne Dumas, MD  metoprolol tartrate (LOPRESSOR)  50 MG tablet Take 1 tablet (50 mg total) by mouth daily. 10/23/2016  Yes Johnson, Clanford L, MD  ondansetron (ZOFRAN) 8 MG tablet Take 1 tablet (8 mg total) by mouth every 4 (four) hours as needed for nausea or vomiting. 09/23/16  Yes Twana First, MD  potassium chloride (KLOR-CON) 20 MEQ packet Take 40 mEq by mouth 3 (three) times daily. 10/17/16 11/16/16 Yes Reyne Dumas, MD    nystatin (MYCOSTATIN) 100000 UNIT/ML suspension Take 5 mLs (500,000 Units total) by mouth 4 (four) times daily. 10/25/2016   Murlean Iba, MD    Current Facility-Administered Medications  Medication Dose Route Frequency Provider Last Rate Last Dose  . acetaminophen (TYLENOL) tablet 650 mg  650 mg Oral Q6H PRN Rise Patience, MD       Or  . acetaminophen (TYLENOL) suppository 650 mg  650 mg Rectal Q6H PRN Rise Patience, MD      . feeding supplement (BOOST / RESOURCE BREEZE) liquid 1 Container  1 Container Oral TID BM Rise Patience, MD   1 Container at 10/31/16 [phone]  . metoprolol tartrate (LOPRESSOR) tablet 12.5 mg  12.5 mg Oral BID Donne Hazel, MD   12.5 mg at 10/31/16 0845  . ondansetron (ZOFRAN) tablet 4 mg  4 mg Oral Q6H PRN Rise Patience, MD       Or  . ondansetron [facility]) injection 4 mg  4 mg Intravenous Q6H PRN Rise Patience, MD      . potassium chloride (K-DUR,KLOR-CON) CR tablet 40 mEq  40 mEq Oral BID Berton Mount, RPH        Allergies as of 10/14/2016 - Review Complete 10/08/2016  Allergen Reaction Noted  . Morphine and related Itching 11/25/2010    Family History  Problem Relation Age of Onset  . Congestive Heart Failure Mother   . Breast cancer Father   . Anesthesia problems Neg Hx     Social History   Social History  . Marital status: Legally Separated    Spouse name: N/A  . Number of children: N/A  . Years of education: N/A   Occupational History  . Nurses Aid     [employer]   Social History Main Topics  . Smoking status: Former Smoker    Quit date: 11/21/2010  . Smokeless tobacco: Never Used  . Alcohol use No  . Drug use: No  . Sexual activity: Not on file   Other Topics Concern  . Not on file   Social History Narrative   Lives in [place] things are her 5 grandchildren (4 girls and a boy), and cooking for family. Cornbread is her specialty; "secret is a dash of vanilla and a pinch of sugar -  tastes like a cake."       Review of Systems: Pertinent positive and negative review of systems were noted in the above HPI section.  All other review of systems was otherwise negative.n.  Physical Exam: Vital signs in last 24 hours: Temp:  [99.1 F (37.3 C)-99.5 F (37.5 C)] 99.5 F (37.5 C) (09/28 1100) Pulse Rate:  [114-120] 114 (09/28 1100) Resp:  [18] 18 (09/28 0649) BP: (93-109)/(52-67) 109/67 (09/28 1100) SpO2:  [93 %-94 %] 93 % (09/28 1100) Last BM Date: 10/30/16 General:   Alert,  Well-developed, elderly African-American female, cooperative and trying to give me a history but speech garbled and she is drooling a bit  Head:  Normocephalic and atraumatic. Eyes:  Sclera clear, no icterus.   Conjunctiva pink. Ears:  Normal auditory acuity. Nose:  No deformity, discharge,  or lesions. Mouth:  No deformity or lesions.   Neck:  Supple; no masses or thyromegaly. Lungs:  Clear throughout to auscultation.   No wheezes, crackles, or rhonchi. Heart:  Regular rate and rhythm; no murmurs, clicks, rubs,  or gallops. Abdomen:  Soft,nontender, BS active,nonpalp mass or hsm.   Rectal:  Deferred -nursing reports one small bloody bowel movement early this morning none since Msk:  Symmetrical without gross deformities. . Pulses:  Normal pulses noted. Extremities:  Without clubbing or edema. Neurologic:  Alert and  oriented x3;  garbled speech which is new this afternoon slight facial droop on the right Skin:  Intact without significant lesions or rashes..   Intake/Output from previous day: 09/27 0701 - 09/28 0700 In: 1388.8 [P.O.:90; I.V.:1298.8] Out: -  Intake/Output this shift: No intake/output data recorded.  Lab Results:  Recent Labs  10/30/16 0535 10/30/16 0812 10/31/16 0445  WBC 8.5 8.0 7.6  HGB 7.7* 8.1* 7.6*  HCT 24.6* 25.9* 24.1*  PLT 133* 138* 135*   BMET  Recent Labs  10/23/2016 2019 10/30/16 0535 10/31/16 0445  NA 148* 147* 141  K 3.3* 3.4* 3.1*  CL 110  110 104  CO2 26 25 27   GLUCOSE 119* 127* 114*  BUN 17 15 12   CREATININE 0.50 0.41* 0.45  CALCIUM 9.0 8.8* 8.6*   LFT  Recent Labs  10/31/16 0445  PROT 5.7*  ALBUMIN 2.5*  AST 26  ALT 19  ALKPHOS 228*  BILITOT 0.9       IMPRESSION:  #32 71 year old African-American female with recent diagnosis of a metastatic poorly differentiated carcinoma from left chest wall biopsy, in patient with history of breast cancer. Recent CT angiogram also showed large bilateral mediastinal masses, on the right the mass is encasing the great vessels, on the left causing displacement of the trachea and the esophagus. Patient did have initial oncology evaluation and not felt to be a candidate for chemotherapy and hospice was recommended #2 acute on chronic anemia with drift in hemoglobin overnight #3 bloody stool 1-etiology not clear, no prior GI evaluation and no recent abdominal imaging Patient may have metastatic disease in the abdomen, or involving the bowel, rule out diverticular #4 new acute mental status changed with garbled speech this afternoon-rule out CVA, rule out secondary to brain metastases #5 hypokalemia  Plan; Do not plan aggressive GI evaluation at this time-with managed supportively with transfusions to keep hemoglobin in the 7-8 range. Discussed speech changes with hospitalist Dr. Lolita Lenz will pursue stroke evaluation and head  CT. Also recommended if imaging to be done to obtain CT of the abdomen and pelvis. Ultimately patient should be transferred to hospice/comfort care.      Amy Esterwood  10/31/2016, 3:12 PM

## 2016-10-31 NOTE — Evaluation (Signed)
Occupational Therapy Evaluation Patient Details Name: [name] MRN: [id number] DOB: 31-Dec-1945 Today's Date: 10/31/2016    History of Present Illness 71 yo female admitted with acute GI bleeding, acute mediastinal mass. Hx of HTN, breast cancer   Clinical Impression   This 72 y/o F presents with the above. PLOF obtained from chart review as Pt presenting with mumbled speech and cognitive impairments this session. Pt requires ModA for bed mobility and Min-ModA to maintain static sitting EOB. Required total assist for perihygiene after incontinent BM at bed level, requires Max-total assist for LB ADLs. Pt will benefit from continued acute OT services and recommend additional services in SNF setting to advance Pt's safety and independence with ADLs and functional mobility prior to return home.     Follow Up Recommendations  SNF    Equipment Recommendations  Other (comment) (defer to next venue )           Precautions / Restrictions Precautions Precautions: Fall Restrictions Weight Bearing Restrictions: No      Mobility Bed Mobility Overal bed mobility: Needs Assistance Bed Mobility: Rolling;Sidelying to Sit;Sit to Sidelying Rolling: Min assist Sidelying to sit: Mod assist;HOB elevated     Sit to sidelying: Mod assist;HOB elevated General bed mobility comments: Heavy reliance on bedrail. assist to advance LEs over EOB and when returning to supine   Transfers                 General transfer comment: NT-pt unable to sit unsupported at EOB    Balance Overall balance assessment: Needs assistance Sitting-balance support: Feet supported Sitting balance-Leahy Scale: Poor Sitting balance - Comments: Pt leaning moderately to R side and anteriorly with cervical and trunk flexion. unable to sit unsupported                                    ADL either performed or assessed with clinical judgement   ADL Overall ADL's : Needs  assistance/impaired Eating/Feeding: Bed level;Minimal assistance   Grooming: Wash/dry face;Set up;Bed level   Upper Body Bathing: Sitting;Minimal assistance   Lower Body Bathing: Maximal assistance;Sitting/lateral leans;Bed level   Upper Body Dressing : Minimal assistance;Sitting;Bed level   Lower Body Dressing: Bed level;Sitting/lateral leans;Total assistance       Toileting- Clothing Manipulation and Hygiene: Total assistance;Bed level Toileting - Clothing Manipulation Details (indicate cue type and reason): Pt with incontinent BM, requires total assist for peri hygiene at bed level      Functional mobility during ADLs: Moderate assistance;Maximal assistance General ADL Comments: Pt requires Mod-MaxA for bed mobility, sat EOB approx 2-3 min with Min-ModA for static sitting balance, returned to supine in bed for perihygiene after incontinent BM with total assist at bed level                          Pertinent Vitals/Pain Pain Assessment: Faces Faces Pain Scale: Hurts even more Pain Location: L side, L LE, R LE, buttocks with movement Pain Descriptors / Indicators: Grimacing;Sore Pain Intervention(s): Limited activity within patient's tolerance;Monitored during session;Repositioned          Extremity/Trunk Assessment Upper Extremity Assessment Upper Extremity Assessment: Generalized weakness;LUE deficits/detail;RUE deficits/detail RUE Deficits / Details: increased edema noted, AROM appears WFL LUE Deficits / Details: Pt with painful AROM above shoulder flexion 0-90* LUE: Unable to fully assess due to pain   Lower Extremity Assessment Lower Extremity Assessment: Defer  to PT evaluation       Communication Communication Communication: Expressive difficulties;Receptive difficulties;Other (comment) (Pt with mumbled speech, difficulty understanding responses )   Cognition Arousal/Alertness: Awake/alert Behavior During Therapy: WFL for tasks  assessed/performed Overall Cognitive Status: Impaired/Different from baseline Area of Impairment: Orientation                 Orientation Level: Disoriented to;Situation;Time;Place             General Comments: Pt with mumbled speech, difficulty understanding responses to questions                     Home Living Family/patient expects to be discharged to:: Skilled nursing facility Living Arrangements: Alone Available Help at Discharge: Neighbor;Available PRN/intermittently Type of Home: House Home Access: Level entry     Home Layout: One level               Home Equipment: Walker - 2 wheels   Additional Comments: PLOF obtained through chart review       Prior Functioning/Environment Level of Independence: Independent with assistive device(s)        Comments: per pt, she uses RW PRN        OT Problem List: Decreased strength;Impaired balance (sitting and/or standing);Decreased cognition;Decreased range of motion;Decreased activity tolerance;Pain      OT Treatment/Interventions: Self-care/ADL training;Therapeutic activities;Balance training;DME and/or AE instruction;Therapeutic exercise;Energy conservation;Patient/family education    OT Goals(Current goals can be found in the care plan section) Acute Rehab OT Goals Patient Stated Goal: to get better OT Goal Formulation: With patient Time For Goal Achievement: 11/14/16 Potential to Achieve Goals: Fair  OT Frequency: Min 2X/week                             AM-PAC PT "6 Clicks" Daily Activity     Outcome Measure Help from another person eating meals?: A Little Help from another person taking care of personal grooming?: A Little Help from another person toileting, which includes using toliet, bedpan, or urinal?: Total Help from another person bathing (including washing, rinsing, drying)?: A Lot Help from another person to put on and taking off regular upper body clothing?: A  Little Help from another person to put on and taking off regular lower body clothing?: Total 6 Click Score: 13   End of Session Nurse Communication: Mobility status  Activity Tolerance: Patient tolerated treatment well Patient left: in bed;with call bell/phone within reach;with bed alarm set  OT Visit Diagnosis: Muscle weakness (generalized) (M62.81);Other symptoms and signs involving cognitive function                Time: 1062-6948 OT Time Calculation (min): 27 min Charges:  OT General Charges $OT Visit: 1 Visit OT Evaluation $OT Eval Moderate Complexity: 1 Mod OT Treatments $Self Care/Home Management : 8-22 mins G-Codes:     [name], OT Pager [phone] 10/31/2016   [name] 10/31/2016, 4:23 PM

## 2016-10-31 NOTE — Significant Event (Signed)
Rapid Response Event Note  Overview: Time Called: 1604 Arrival Time: 1608 Event Type: Neurologic  Called by bedside RN in regards to patient having possible stroke. Upon arrival, patient was having slurred speech and right sided facial droop. Called Code Stroke.  Initial Focused Assessment: Neuro: Patient able to follow basic commands such stick out tongue and grip hands. Motor responses seemed to be purposeful and equal throughout, however patient is very weak with lower extremities. Pupils were 3, equal and reactive to light. Patient did have slurred speech, difficulty swallowing, and right sided facial droop. Last seen normal approximately 1530 today by RN.  Cardiac: ST, s1 and s2 heart sounds heard upon auscultation. Patient BP WNL see vitals, pulses 1-2+ in radial and pedal regions.  Lungs: Breath sounds clear and diminished throughout in all lung fields, O2 Saturations 98-99%, RR mid 20s-see vitals Interventions: MD Wyline Copas notified and seen patient Called Code Stroke STAT CT of head ordered MD Leonel Ramsay with Neuro on way to see patient   Plan of Care (if not transferred): Patient currently DNR and palliative consult as well. Patient has hx of metastatic breast cancer. Will continue to monitor and assess patient in Telemetry at this time. Call Rapid Response again if needed.  Event Summary:  [name] C

## 2016-10-31 NOTE — Progress Notes (Signed)
Pt. Refused PO metoprolol and KDUR. HR currently in the 120's and am labs showed K was 3.1. On call MD Blount paged and made aware. Will continue to monitor pt.

## 2016-10-31 NOTE — Plan of Care (Signed)
Call to niece, [name] who lives in [place], [place]. [phone]. [name] is the only available next of kin.  She comes to see her aunt "Amanda Chapel" Yates usually twice per month. At this point, [name] is main decision-maker by default. [name] is ready and willing to accept this responsibility. Her desire is comfort for her aunt.  Please contact her regarding patient care/choices. Quinn Axe, Palliative NP

## 2016-10-31 NOTE — Consult Note (Signed)
Neurology Consultation Reason for Consult: Stroke Referring Physician: Rhetta Mura  CC: Stroke  History is obtained from: Patient  HPI: [name] is a 71 y.o. female who was in her normal state of health around 2 PM, but subsequently was discovered to have severe dysarthria. Echo showed was activated and she was taken for stat CT.   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  Also, she has recently been moving towards a comfort only approach. Given her severe advanced cancer with no treatment options  LKW: 2 PM tpa given?: no, GI bleed    ROS: Unable to obtain due to severe dysarthria  Past Medical History:  Diagnosis Date  . Breast cancer ([place])    many years ago  . Hypertension      Family History  Problem Relation Age of Onset  . Congestive Heart Failure Mother   . Breast cancer Father   . Anesthesia problems Neg Hx      Social History:  reports that she quit smoking about 5 years ago. She has never used smokeless tobacco. She reports that she does not drink alcohol or use drugs.   Exam: Current vital signs: BP 109/67 (BP Location: Right Arm)   Pulse (!) 114   Temp 99.5 F (37.5 C) (Oral)   Resp 18   Ht 5\' 7"  (1.702 m)   Wt 53.3 kg (117 lb 8.1 oz)   SpO2 93%   BMI 18.40 kg/m  Vital signs in last 24 hours: Temp:  [99.1 F (37.3 C)-99.5 F (37.5 C)] 99.5 F (37.5 C) (09/28 1100) Pulse Rate:  [114-120] 114 (09/28 1100) Resp:  [18] 18 (09/28 0649) BP: (93-109)/(52-67) 109/67 (09/28 1100) SpO2:  [93 %-94 %] 93 % (09/28 1100)   Physical Exam  Constitutional: Appears Chronically ill Psych: Affect appropriate to situation Eyes: No scleral injection HENT: No OP obstrucion Head: Normocephalic.  Cardiovascular: Normal rate and regular rhythm.  Respiratory: Effort normal and breath sounds normal to anterior ascultation GI: Soft.  No distension.  There is no tenderness.  Skin: WDI  Neuro: Mental Status: Patient is awake, alert, she does not appear to be aphasic, but her speech is so dysarthric that I am not able to understand really anything that she says. Cranial Nerves: II: Visual Fields are full. Pupils are equal, round, and reactive to light.   III,IV, VI: EOMI without ptosis or diploplia.  V: Facial sensation is symmetric to temperature VII: Facial movement is decreased on the right VIII: hearing is intact to voice X: Uvula elevates symmetrically XI: Shoulder shrug is symmetric. XII: tongue is midline without atrophy or fasciculations.  Motor: She does not give good effort anywhere, with weakness throughout. Difficult to decide if she is asymmetric. Sensory: Sensation is decreased on the left Cerebellar: She is markedly ataxic in the right arm and leg.     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 She is not a candidate for any type of intervention. Given that she is transitioning to comfort care, I would not favor any further workup. Due to the fact that she has GI bleed, and even antiplatelet therapy at this point I feel would not offer her a benefit outweighing  risk.  Recommendations: 1) if the patient decides on full aggressive care, then a stroke workup could be performed, though given her limited life expectancy I feel that this would be of limited benefit. 2) please call if further questions or concerns remain.   Roland Rack, MD [facility] [phone]  If 7pm- 7am, please page neurology on call as listed in [place].

## 2016-10-31 NOTE — Evaluation (Signed)
Physical Therapy Evaluation Patient Details Name: [name] MRN: [id number] DOB: 01-06-1946 Today's Date: 10/31/2016   History of Present Illness  71 yo female admitted with acute GI bleeding, acute mediastinal mass. Hx of HTN, breast cancer  Clinical Impression  On eval, pt required Mod assist for bed mobility. Sat EOB ~1-2 minutes with Mod assist for static sitting balance. Pt c/o "head swimming". Unable to assess BP due to risk for fall while sitting EOB. Assisted pt back to supine-BP 95/59. Noted some confusion as well during session. Noted blood on end of external catheter-made NT aware. No family present. Recommend SNF. Pt is not safe to d/c home alone. Will follow and progress activity as tolerated.     Follow Up Recommendations SNF    Equipment Recommendations  None recommended by PT    Recommendations for Other Services       Precautions / Restrictions Precautions Precautions: Fall Restrictions Weight Bearing Restrictions: No      Mobility  Bed Mobility Overal bed mobility: Needs Assistance Bed Mobility: Rolling;Sidelying to Sit;Sit to Sidelying Rolling: Min assist Sidelying to sit: Mod assist;HOB elevated     Sit to sidelying: Mod assist;HOB elevated General bed mobility comments: Heavy reliance on bedrail. Multiple attempts to get to EOB. Increased time. Limited by pain. Sat EOB ~1-2 minutes before returning to sidelying  Transfers                 General transfer comment: NT-pt unable to sit unsupported at EOB  Ambulation/Gait                Stairs            Wheelchair Mobility    Modified Rankin (Stroke Patients Only)       Balance Overall balance assessment: Needs assistance Sitting-balance support: Feet supported Sitting balance-Leahy Scale: Poor Sitting balance - Comments: Pt leaning moderately to R side and anteriorly with cervical and trunk flexion. Pt c/o "head swimming". Unable to sit unsupported.  Postural control:  Right lateral lean                                   Pertinent Vitals/Pain Pain Assessment: Faces Faces Pain Scale: Hurts whole lot Pain Location: L side, L LE, R LE, buttocks with movement Pain Intervention(s): Monitored during session;Limited activity within patient's tolerance;Repositioned    Home Living Family/patient expects to be discharged to:: Skilled nursing facility Living Arrangements: Alone Available Help at Discharge: Neighbor;Available PRN/intermittently Type of Home: House Home Access: Level entry     Home Layout: One level Home Equipment: Walker - 2 wheels      Prior Function Level of Independence: Independent with assistive device(s)         Comments: per pt, she uses RW PRN     Hand Dominance        Extremity/Trunk Assessment   Upper Extremity Assessment Upper Extremity Assessment: Generalized weakness    Lower Extremity Assessment Lower Extremity Assessment: Generalized weakness    Cervical / Trunk Assessment Cervical / Trunk Assessment: Normal  Communication   Communication: No difficulties  Cognition Arousal/Alertness: Awake/alert Behavior During Therapy: WFL for tasks assessed/performed Overall Cognitive Status: Impaired/Different from baseline Area of Impairment: Orientation                 Orientation Level: Disoriented to;Situation;Time;Place  General Comments      Exercises     Assessment/Plan    PT Assessment Patient needs continued PT services  PT Problem List Decreased strength;Decreased mobility;Decreased activity tolerance;Decreased balance;Decreased knowledge of use of DME;Pain;Decreased cognition       PT Treatment Interventions DME instruction;Gait training;Therapeutic activities;Therapeutic exercise;Patient/family education;Balance training;Functional mobility training    PT Goals (Current goals can be found in the Care Plan section)  Acute Rehab PT Goals Patient  Stated Goal: to get better PT Goal Formulation: Patient unable to participate in goal setting Time For Goal Achievement: 11/14/16 Potential to Achieve Goals: Fair    Frequency Min 3X/week   Barriers to discharge Decreased caregiver support      Co-evaluation               AM-PAC PT "6 Clicks" Daily Activity  Outcome Measure Difficulty turning over in bed (including adjusting bedclothes, sheets and blankets)?: Unable Difficulty moving from lying on back to sitting on the side of the bed? : Unable Difficulty sitting down on and standing up from a chair with arms (e.g., wheelchair, bedside commode, etc,.)?: Unable Help needed moving to and from a bed to chair (including a wheelchair)?: Total Help needed walking in hospital room?: Total Help needed climbing 3-5 steps with a railing? : Total 6 Click Score: 6    End of Session   Activity Tolerance: Patient limited by pain (Limited by dizziness) Patient left: in bed;with call bell/phone within reach;with bed alarm set   PT Visit Diagnosis: Muscle weakness (generalized) (M62.81);Difficulty in walking, not elsewhere classified (R26.2)    Time: 1610-9604 PT Time Calculation (min) (ACUTE ONLY): 23 min   Charges:   PT Evaluation $PT Eval Low Complexity: 1 Low PT Treatments $Therapeutic Activity: 8-22 mins   PT G Codes:          Weston Anna, MPT Pager: [phone]

## 2016-11-01 LAB — BASIC METABOLIC PANEL
Anion gap: 13 (ref 5–15)
BUN: 11 mg/dL (ref 6–20)
CHLORIDE: 101 mmol/L (ref 101–111)
CO2: 27 mmol/L (ref 22–32)
CREATININE: 0.46 mg/dL (ref 0.44–1.00)
Calcium: 8.1 mg/dL — ABNORMAL LOW (ref 8.9–10.3)
Glucose, Bld: 102 mg/dL — ABNORMAL HIGH (ref 65–99)
POTASSIUM: 2.8 mmol/L — AB (ref 3.5–5.1)
SODIUM: 141 mmol/L (ref 135–145)

## 2016-11-01 LAB — CBC
HEMATOCRIT: 24.8 % — AB (ref 36.0–46.0)
Hemoglobin: 7.7 g/dL — ABNORMAL LOW (ref 12.0–15.0)
MCH: 25.9 pg — ABNORMAL LOW (ref 26.0–34.0)
MCHC: 31 g/dL (ref 30.0–36.0)
MCV: 83.5 fL (ref 78.0–100.0)
PLATELETS: 139 10*3/uL — AB (ref 150–400)
RBC: 2.97 MIL/uL — AB (ref 3.87–5.11)
RDW: 17.5 % — AB (ref 11.5–15.5)
WBC: 8.4 10*3/uL (ref 4.0–10.5)

## 2016-11-01 MED ORDER — FENTANYL CITRATE (PF) 100 MCG/2ML IJ SOLN
12.5000 ug | Freq: Once | INTRAMUSCULAR | Status: AC
  Administered 2016-11-01: 12.5 ug via INTRAVENOUS
  Filled 2016-11-01: qty 2

## 2016-11-01 MED ORDER — METOPROLOL TARTRATE 5 MG/5ML IV SOLN
2.5000 mg | Freq: Four times a day (QID) | INTRAVENOUS | Status: DC
  Administered 2016-11-01 – 2016-11-03 (×8): 2.5 mg via INTRAVENOUS
  Filled 2016-11-01 (×10): qty 5

## 2016-11-01 MED ORDER — LABETALOL HCL 5 MG/ML IV SOLN
5.0000 mg | INTRAVENOUS | Status: DC | PRN
  Administered 2016-11-01: 5 mg via INTRAVENOUS
  Filled 2016-11-01 (×3): qty 4

## 2016-11-01 MED ORDER — KCL IN DEXTROSE-NACL 40-5-0.9 MEQ/L-%-% IV SOLN
INTRAVENOUS | Status: DC
  Administered 2016-11-01: 13:00:00 via INTRAVENOUS
  Administered 2016-11-02: 75 mL/h via INTRAVENOUS
  Administered 2016-11-03 (×2): via INTRAVENOUS
  Filled 2016-11-01 (×7): qty 1000

## 2016-11-01 MED ORDER — MORPHINE SULFATE (PF) 4 MG/ML IV SOLN
2.0000 mg | INTRAVENOUS | Status: AC | PRN
  Administered 2016-11-02 – 2016-11-03 (×3): 2 mg via INTRAVENOUS
  Filled 2016-11-01 (×3): qty 1

## 2016-11-01 MED ORDER — DIPHENHYDRAMINE HCL 50 MG/ML IJ SOLN: 12.5000 mg | Freq: Four times a day (QID) | INTRAMUSCULAR | Status: DC | PRN

## 2016-11-01 NOTE — Progress Notes (Signed)
PROGRESS NOTE    [name]  UMP:[id number] DOB: Sep 19, 1945 DOA: 10/21/2016 PCP: Patient, No Pcp Per    Brief Narrative:  71 y.o. female with history of metastatic breast cancer was a recent recurrence who was admitted to [facility] for dehydration and was discharged yesterday and was taken home. At home patient's friend found that patient was weak and not doing well. Parents also noticed that patient had some blood in the underpants.  Assessment & Plan:   Principal Problem:   Acute GI bleeding Active Problems:   Essential hypertension   Non-ischemic cardiomyopathy (HCC)   Acute blood loss anemia   Hypernatremia   Goals of care, counseling/discussion   Encounter for hospice care discussion   Altered mental status   Malignancy ([place])   1. BRBPR/ GI bleed  1. Hgb has remained overall stable, denying abdominal pain 2. No further episodes of bright red blood per rectum until this morning. Nursing staff reports patient passing bright red blood with clots. 3. Appreciate input by GI. Given poor overall prognosis, no further GI work up was recommended 4. Continue with supportive care 2. Dehydration with hypernatremia 1. Patient is continued on IV fluid hydration. 2. Currently stable 3. Metastatic breast cancer 1. was evaluated by oncologist 1 day prior to admission and recommended no systemic therapy given the patient's functional status.  2. Palliative care consulted, appreciate input. 3. Prognosis noted to be 28mos prior to admission 4. Given events of recent CVA, see below, suspect prognosis is now much worse 5. Question residential hospice 4. History of nonischemic cardiomyopathy 1. Remains stable currently 5. Hypertension 1. Continue on metoprolol with hold parameters 6. Acute CVA 1. See events from 9/28. Patient noted to have acute slurred speech and facial droop. Code stroke called and patient determined to have acute CVA 2. Neurology recommendations noted for no  further work up given already poor prognosis 3. No ASA given concerns of GI bleeding  DVT prophylaxis: SCD's Code Status: DNR Family Communication: Pt in room, family not at bedsie Disposition Plan: Uncertain at this time  Consultants:   Palliative Care  Gastroenterology  Neurology  Procedures:     Antimicrobials: Anti-infectives    None      Subjective: Unable to assess at this time  Objective: Vitals:   11/01/16 0652 11/01/16 0823 11/01/16 1127 11/01/16 1318  BP: 117/65 115/62 (!) 142/80 98/60  Pulse: (!) 128 (!) 128 (!) 145 (!) 121  Resp: 20 (!) 22    Temp: 98.9 F (37.2 C) 98 F (36.7 C) (!) 97.4 F (36.3 C) 98.1 F (36.7 C)  TempSrc: Oral Oral Axillary Axillary  SpO2: 92% 96% 94% 93%  Weight:      Height:        Intake/Output Summary (Last 24 hours) at 11/01/16 1641 Last data filed at 11/01/16 1400  Gross per 24 hour  Intake             47.5 ml  Output                0 ml  Net             47.5 ml   Filed Weights   10/26/2016 2234  Weight: 53.3 kg (117 lb 8.1 oz)    Examination: General exam: Conversant, in no acute distress Respiratory system: normal chest rise, clear, no audible wheezing Cardiovascular system: regular rhythm, s1-s2 Gastrointestinal system: Nondistended, nontender, pos BS Central nervous system: No seizures, no tremors Extremities: No  cyanosis, no joint deformities Skin: No rashes, no pallor Psychiatry: Unable to fully assess given level of mentation  Data Reviewed: I have personally reviewed following labs and imaging studies  CBC:  Recent Labs Lab 10/23/2016 2019  10/30/16 0535 10/30/16 0812 10/31/16 0445 10/31/16 1710 11/01/16 0910  WBC 8.3  < > 8.5 8.0 7.6 8.3 8.4  NEUTROABS 6.1  --   --   --   --   --   --   HGB 7.7*  < > 7.7* 8.1* 7.6* 7.5* 7.7*  HCT 24.6*  < > 24.6* 25.9* 24.1* 23.7* 24.8*  MCV 86.0  < > 84.5 85.5 82.5 83.7 83.5  PLT 152  < > 133* 138* 135* 128* 139*  < > = values in this interval not  displayed. Basic Metabolic Panel:  Recent Labs Lab 10/05/2016 0552 11/02/2016 2019 10/30/16 0535 10/31/16 0445 11/01/16 0910  NA 150* 148* 147* 141 141  K 3.5 3.3* 3.4* 3.1* 2.8*  CL 108 110 110 104 101  CO2 27 26 25 27 27   GLUCOSE 114* 119* 127* 114* 102*  BUN 19 17 15 12 11   CREATININE 0.58 0.50 0.41* 0.45 0.46  CALCIUM 8.8* 9.0 8.8* 8.6* 8.1*   GFR: Estimated Creatinine Clearance: 53.5 mL/min (by C-G formula based on SCr of 0.46 mg/dL). Liver Function Tests:  Recent Labs Lab 10/31/16 0445  AST 26  ALT 19  ALKPHOS 228*  BILITOT 0.9  PROT 5.7*  ALBUMIN 2.5*   No results for input(s): LIPASE, AMYLASE in the last 168 hours. No results for input(s): AMMONIA in the last 168 hours. Coagulation Profile: No results for input(s): INR, PROTIME in the last 168 hours. Cardiac Enzymes: No results for input(s): CKTOTAL, CKMB, CKMBINDEX, TROPONINI in the last 168 hours. BNP (last 3 results) No results for input(s): PROBNP in the last 8760 hours. HbA1C: No results for input(s): HGBA1C in the last 72 hours. CBG:  Recent Labs Lab 10/28/16 2101 10/26/2016 0746 10/26/2016 1147  GLUCAP 121* 106* 149*   Lipid Profile: No results for input(s): CHOL, HDL, LDLCALC, TRIG, CHOLHDL, LDLDIRECT in the last 72 hours. Thyroid Function Tests: No results for input(s): TSH, T4TOTAL, FREET4, T3FREE, THYROIDAB in the last 72 hours. Anemia Panel:  Recent Labs  10/30/16 0812  VITAMINB12 285  FOLATE 2.1*  FERRITIN 1,203*  TIBC 181*  IRON 65  RETICCTPCT 2.7   Sepsis Labs: No results for input(s): PROCALCITON, LATICACIDVEN in the last 168 hours.  No results found for this or any previous visit (from the past 240 hour(s)).   Radiology Studies: Ct Angio Head W Or Wo Contrast  Result Date: 10/31/2016 CLINICAL DATA:  Right facial droop and slurred speech. History of breast cancer. EXAM: CT ANGIOGRAPHY HEAD AND NECK TECHNIQUE: Multidetector CT imaging of the head and neck was performed using  the standard protocol during bolus administration of intravenous contrast. Multiplanar CT image reconstructions and MIPs were obtained to evaluate the vascular anatomy. Carotid stenosis measurements (when applicable) are obtained utilizing NASCET criteria, using the distal internal carotid diameter as the denominator. CONTRAST:  144mL ISOVUE-300 IOPAMIDOL (ISOVUE-300) INJECTION 61% COMPARISON:  Chest CTA 09/05/2016 FINDINGS: CTA NECK FINDINGS Aortic arch: Normal variant aortic arch branching pattern with aberrant right subclavian artery and common origin of the common carotid arteries. 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 Right carotid system: Patent without evidence of stenosis, dissection, or significant atherosclerosis. Left carotid system: Patent without  evidence of stenosis, dissection, or significant atherosclerosis. Partially retropharyngeal course of the proximal ICA. Vertebral arteries: Patent without evidence of stenosis, dissection, or significant atherosclerosis. Mildly dominant left vertebral artery. Skeleton: Interbody and posterior element ankylosis at C2-3 which may be congenital. Heterogeneous/patchy sclerotic appearance of multiple cervical and thoracic vertebrae as described on recent chest CTA suspicious for metastases. Other neck: No neck mass. Upper chest: Multiple large mediastinal masses as seen on the prior chest CTA. The largest measures 4.9 cm in the high right paratracheal region near the thoracic inlet and is similar in size to the prior CTA, while a left paratracheal/prevascular mass has enlarged and now measures 4.3 x 3.9 cm (previously 3.2 x 2.8 cm). There is wall thickening and debris in the proximal thoracic esophagus as before. Small pleural effusions are partially visualized. There is centrilobular emphysema. A left internal mammary chain lymph node has enlarged and now measures 2.0 x 1.7 cm  (previously 1.3 x 1.2 cm). Review of the MIP images confirms the above findings CTA HEAD FINDINGS Anterior circulation: The internal carotid arteries are patent from skullbase to carotid termini. There is minimal siphon atherosclerosis without stenosis. ACAs and MCAs are patent without evidence of proximal branch occlusion or significant stenosis. No aneurysm. Posterior circulation: The intracranial vertebral arteries are widely patent to the basilar. Patent AICA and SCA origins are visualized bilaterally. The basilar artery is widely patent. There are large posterior communicating arteries bilaterally with asymmetric hypoplasia of the left P1 segment. PCAs are patent without evidence of significant stenosis. No aneurysm. Venous sinuses: Patent. Anatomic variants: None of significance. Delayed phase: 2.2 cm enhancing anterior right frontal mass with mild vasogenic edema, favored to be intra-axial though this is not definite. Two smaller extra-axial masses nearby over the anterior right frontal convexity measure 1.7 cm and 1.4 cm, and there is a 9 mm enhancing extra-axial mass over the left anterior frontal convexity. Review of the MIP images confirms the above findings IMPRESSION: 1. No emergent large vessel occlusion or significant stenosis. 2. 2.2 cm right frontal mass with mild vasogenic edema. Two smaller right frontal masses and one left frontal mass are extra-axial and without edema. These are concerning for metastases. 3. Progressive metastatic thoracic lymphadenopathy. 4.  Emphysema (ICD10-J43.9). Preliminary findings of no large vessel occlusion and presence of intracranial masses were communicated via telephone to Dr. Leonel Ramsay on 10/31/2016 at 4:48 p.m. Electronically Signed   By: Logan Bores M.D.   On: 10/31/2016 17:44   Ct Head Wo Contrast  Result Date: 10/31/2016 CLINICAL DATA:  Right facial droop. Slurred speech. Possible brainstem infarct. History of breast cancer. EXAM: CT HEAD WITHOUT  CONTRAST TECHNIQUE: Contiguous axial images were obtained from the base of the skull through the vertex without intravenous contrast. COMPARISON:  None. FINDINGS: Brain: There are small bilateral parietal cortical infarcts. The right parietal infarct is larger than the left and has more of a chronic appearance. The left parietal infarct may be subacute or chronic. No definite acute large territory infarct is identified. There is a small region of low density in the white matter of the anterior right frontal lobe consistent with vasogenic edema with evidence of an associated 2 cm hyperattenuating soft tissue mass located along the gray-white junction. Two additional smaller soft tissue masses over the anterior right frontal convexity are more extra-axial in appearance. No acute intracranial hemorrhage, midline shift, or extra-axial fluid collection is seen. The ventricles are normal. Vascular: No hyperdense vessel. Skull: No fracture or focal osseous lesion. Sinuses/Orbits:  Paranasal sinuses and mastoid air cells are clear. Unremarkable orbits. Other: 1.9 cm intermediate density the nodule in the left temporoparietal scalp, indeterminate. IMPRESSION: 1. No intracranial hemorrhage or definite acute infarct. 2. Small bilateral parietal cortical infarcts, favored to be chronic or possibly subacute. 3. Multiple right frontal masses measuring up to 2 cm in size with mild edema. These will be more fully evaluated on pending CTA. These results were called by telephone at the time of interpretation on 10/31/2016 at 4:48 pm to Dr. Leonel Ramsay, who verbally acknowledged these results. Electronically Signed   By: Logan Bores M.D.   On: 10/31/2016 16:58   Ct Angio Neck W Or Wo Contrast  Result Date: 10/31/2016 CLINICAL DATA:  Right facial droop and slurred speech. History of breast cancer. EXAM: CT ANGIOGRAPHY HEAD AND NECK TECHNIQUE: Multidetector CT imaging of the head and neck was performed using the standard protocol  during bolus administration of intravenous contrast. Multiplanar CT image reconstructions and MIPs were obtained to evaluate the vascular anatomy. Carotid stenosis measurements (when applicable) are obtained utilizing NASCET criteria, using the distal internal carotid diameter as the denominator. CONTRAST:  1109mL ISOVUE-300 IOPAMIDOL (ISOVUE-300) INJECTION 61% COMPARISON:  Chest CTA 09/05/2016 FINDINGS: CTA NECK FINDINGS Aortic arch: Normal variant aortic arch branching pattern with aberrant right subclavian artery and common origin of the common carotid arteries. 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 Right carotid system: Patent without evidence of stenosis, dissection, or significant atherosclerosis. Left carotid system: Patent without evidence of stenosis, dissection, or significant atherosclerosis. Partially retropharyngeal course of the proximal ICA. Vertebral arteries: Patent without evidence of stenosis, dissection, or significant atherosclerosis. Mildly dominant left vertebral artery. Skeleton: Interbody and posterior element ankylosis at C2-3 which may be congenital. Heterogeneous/patchy sclerotic appearance of multiple cervical and thoracic vertebrae as described on recent chest CTA suspicious for metastases. Other neck: No neck mass. Upper chest: Multiple large mediastinal masses as seen on the prior chest CTA. The largest measures 4.9 cm in the high right paratracheal region near the thoracic inlet and is similar in size to the prior CTA, while a left paratracheal/prevascular mass has enlarged and now measures 4.3 x 3.9 cm (previously 3.2 x 2.8 cm). There is wall thickening and debris in the proximal thoracic esophagus as before. Small pleural effusions are partially visualized. There is centrilobular emphysema. A left internal mammary chain lymph node has enlarged and now measures 2.0 x 1.7 cm (previously 1.3 x 1.2  cm). Review of the MIP images confirms the above findings CTA HEAD FINDINGS Anterior circulation: The internal carotid arteries are patent from skullbase to carotid termini. There is minimal siphon atherosclerosis without stenosis. ACAs and MCAs are patent without evidence of proximal branch occlusion or significant stenosis. No aneurysm. Posterior circulation: The intracranial vertebral arteries are widely patent to the basilar. Patent AICA and SCA origins are visualized bilaterally. The basilar artery is widely patent. There are large posterior communicating arteries bilaterally with asymmetric hypoplasia of the left P1 segment. PCAs are patent without evidence of significant stenosis. No aneurysm. Venous sinuses: Patent. Anatomic variants: None of significance. Delayed phase: 2.2 cm enhancing anterior right frontal mass with mild vasogenic edema, favored to be intra-axial though this is not definite. Two smaller extra-axial masses nearby over the anterior right frontal convexity measure 1.7 cm and 1.4 cm, and there is a 9 mm enhancing extra-axial mass over the left anterior frontal convexity. Review of the MIP images  confirms the above findings IMPRESSION: 1. No emergent large vessel occlusion or significant stenosis. 2. 2.2 cm right frontal mass with mild vasogenic edema. Two smaller right frontal masses and one left frontal mass are extra-axial and without edema. These are concerning for metastases. 3. Progressive metastatic thoracic lymphadenopathy. 4.  Emphysema (ICD10-J43.9). Preliminary findings of no large vessel occlusion and presence of intracranial masses were communicated via telephone to Dr. Leonel Ramsay on 10/31/2016 at 4:48 p.m. Electronically Signed   By: Logan Bores M.D.   On: 10/31/2016 17:44   Ct Abdomen Pelvis W Contrast  Result Date: 10/31/2016 CLINICAL DATA:  Abdominal distension EXAM: CT ABDOMEN AND PELVIS WITH CONTRAST TECHNIQUE: Multidetector CT imaging of the abdomen and pelvis was  performed using the standard protocol following bolus administration of intravenous contrast. CONTRAST:  100 mL Isovue 370 intravenous COMPARISON:  CT chest 09/05/2016 FINDINGS: Lower chest: Small bilateral pleural effusions. Mild passive atelectasis in the posterior lung bases. Normal heart size. Hepatobiliary: Heterogenous fatty infiltration. More focal heterogeneous ill-defined hypodensity near the falciform ligament. No calcified gallstones or biliary dilatation. Pancreas: Unremarkable. No pancreatic ductal dilatation or surrounding inflammatory changes. Spleen: Normal in size without focal abnormality. Adrenals/Urinary Tract: Adrenal glands are within normal limits. Delayed phase imaging of the kidneys. No hydronephrosis. The bladder is grossly unremarkable Stomach/Bowel: The stomach is nonenlarged. No dilated small bowel. Moderate stool in the colon. Normal appendix. No colon wall thickening. Vascular/Lymphatic: Aortic atherosclerosis. No aneurysmal dilatation. Pre aortic lymph node measuring 1 cm. Subcentimeter retroperitoneal lymph nodes. No significant pelvic adenopathy. Reproductive: Status post hysterectomy. No adnexal masses. Other: Negative for free air or free fluid. Musculoskeletal: Multiple lucent lesions within the vertebral bodies and faint foci of sclerosis within the vertebral bodies. Right greater than left sclerosis of the iliac bone. Soft tissue thickening around the right posterior iliac bone with widening of the right SI joint. Mild irregular sclerosis at the inferior pubic rami and ischium. Fusion of the L4 and L5 vertebral bodies. IMPRESSION: 1. Negative for a bowel obstruction.  Moderate stool in the colon. 2. Small bilateral pleural effusions 3. Heterogenous fat infiltration of liver. Ill-defined hypodensity near the falciform ligament, although location would suggest focal fat infiltration, consideration is also given to a metastatic lesion given its ill-defined appearance. 4. Small  upper abdominal and periaortic lymph nodes. 5. Multiple lucent and sclerotic lesions within the vertebral bodies and pelvis consistent with skeletal metastatic disease. Electronically Signed   By: Donavan Foil M.D.   On: 10/31/2016 18:16   Dg Chest Port 1 View  Result Date: 10/31/2016 CLINICAL DATA:  Assess for aspiration. History of metastatic breast cancer. EXAM: PORTABLE CHEST 1 VIEW COMPARISON:  Chest radiograph October 13, 2016 FINDINGS: Bibasilar airspace opacities and small pleural effusions. Similar mild chronic interstitial prominence. Cardiac silhouette is normal. Calcified aortic knob. Tortuous aorta. No pneumothorax. Bilateral mastectomies with surgical clips in axilla. Osteopenia. IMPRESSION: Bibasilar airspace opacities, potential pneumonia with small pleural effusions. Aortic Atherosclerosis (ICD10-I70.0). Electronically Signed   By: Elon Alas M.D.   On: 10/31/2016 16:15    Scheduled Meds: . metoprolol tartrate  2.5 mg Intravenous Q6H   Continuous Infusions: . dextrose 5 % and 0.9 % NaCl with KCl 40 mEq/L 75 mL/hr at 11/01/16 1322     LOS: 3 days   Aldrich Lloyd, Orpah Melter, MD [facility] Pager [phone]  If 7PM-7AM, please contact night-coverage [email or web address] Password [facility] 11/01/2016, 4:41 PM

## 2016-11-01 NOTE — Progress Notes (Signed)
Pt. Refused AM labs. On call MD Blount paged and made aware. Will continue to monitor.

## 2016-11-02 DIAGNOSIS — T17908A Unspecified foreign body in respiratory tract, part unspecified causing other injury, initial encounter: Secondary | ICD-10-CM

## 2016-11-02 DIAGNOSIS — R400 Somnolence: Secondary | ICD-10-CM

## 2016-11-02 NOTE — Progress Notes (Signed)
PROGRESS NOTE    [name]  WHQ:[id number] DOB: 25-May-1945 DOA: 10/08/2016 PCP: Patient, No Pcp Per    Brief Narrative:  71 y.o. female with history of metastatic breast cancer was a recent recurrence who was admitted to [facility] for dehydration and was discharged yesterday and was taken home. At home patient's friend found that patient was weak and not doing well. Parents also noticed that patient had some blood in the underpants.  Assessment & Plan:   Principal Problem:   Acute GI bleeding Active Problems:   Essential hypertension   Non-ischemic cardiomyopathy (HCC)   Acute blood loss anemia   Hypernatremia   Goals of care, counseling/discussion   Encounter for hospice care discussion   Altered mental status   Malignancy ([place])   1. BRBPR/ GI bleed  1. Hgb has remained overall stable, denying abdominal pain 2. Nursing staff reports patient passing bright red blood with clots recently 3. Appreciate input by GI. Given poor overall prognosis, no further GI work up was recommended 4. Continue with supportive care 2. Dehydration with hypernatremia 1. Patient is continued on IV fluid hydration. 2. Remains stable 3. Metastatic breast cancer 1. was evaluated by oncologist 1 day prior to admission and recommended no systemic therapy given the patient's functional status.  2. Palliative care consulted, appreciate input. 3. Prognosis noted to be 37mos prior to admission 4. Given events of recent CVA, see below, suspect prognosis is now much worse 5. Appreciate input by Palliative Care. Discussed case with Dr. Domingo Cocking. Following family meeting, patients wishes are for focus on comfort with plans for residential hospice 4. History of nonischemic cardiomyopathy 1. Remains stable currently 5. Hypertension 1. Continue on metoprolol with hold parameters 6. Acute CVA 1. See events from 9/28. Patient noted to have acute slurred speech and facial droop. Code stroke called and  patient determined to have acute CVA 2. Neurology recommendations noted for no further work up given already poor prognosis 3. No ASA given concerns of GI bleeding 4. Seen by palliative care. Plans for transition to residential hospice  DVT prophylaxis: SCD's Code Status: DNR Family Communication: Pt in room, family not at bedsie Disposition Plan: Uncertain at this time  Consultants:   Palliative Care  Gastroenterology  Neurology  Procedures:     Antimicrobials: Anti-infectives    None      Subjective: Difficult to assess  Objective: Vitals:   11/01/16 2142 11/02/16 0018 11/02/16 0521 11/02/16 1528  BP: 101/66 116/67 (!) 108/55 113/60  Pulse: (!) 120  (!) 119 (!) 120  Resp: 18  20 18   Temp: 97.8 F (36.6 C)  98.7 F (37.1 C) 98.6 F (37 C)  TempSrc: Axillary  Axillary Axillary  SpO2: 98%  98% 98%  Weight:      Height:        Intake/Output Summary (Last 24 hours) at 11/02/16 1649 Last data filed at 11/02/16 1529  Gross per 24 hour  Intake             1180 ml  Output                0 ml  Net             1180 ml   Filed Weights   10/06/2016 2234  Weight: 53.3 kg (117 lb 8.1 oz)    Examination: General exam: laying in bed, appears lethargic Respiratory system: normal chest rise, clear, no audible wheezing Cardiovascular system: regular rhythm, s1-s2 Gastrointestinal system:  Nondistended, nontender, pos BS Central nervous system: No seizures, no tremors Extremities: No cyanosis, no joint deformities Skin: No rashes, no pallor Psychiatry: cannot assess given current mentation   Data Reviewed: I have personally reviewed following labs and imaging studies  CBC:  Recent Labs Lab 10/05/2016 2019  10/30/16 0535 10/30/16 0812 10/31/16 0445 10/31/16 1710 11/01/16 0910  WBC 8.3  < > 8.5 8.0 7.6 8.3 8.4  NEUTROABS 6.1  --   --   --   --   --   --   HGB 7.7*  < > 7.7* 8.1* 7.6* 7.5* 7.7*  HCT 24.6*  < > 24.6* 25.9* 24.1* 23.7* 24.8*  MCV 86.0  < >  84.5 85.5 82.5 83.7 83.5  PLT 152  < > 133* 138* 135* 128* 139*  < > = values in this interval not displayed. Basic Metabolic Panel:  Recent Labs Lab 10/07/2016 0552 10/14/2016 2019 10/30/16 0535 10/31/16 0445 11/01/16 0910  NA 150* 148* 147* 141 141  K 3.5 3.3* 3.4* 3.1* 2.8*  CL 108 110 110 104 101  CO2 27 26 25 27 27   GLUCOSE 114* 119* 127* 114* 102*  BUN 19 17 15 12 11   CREATININE 0.58 0.50 0.41* 0.45 0.46  CALCIUM 8.8* 9.0 8.8* 8.6* 8.1*   GFR: Estimated Creatinine Clearance: 53.5 mL/min (by C-G formula based on SCr of 0.46 mg/dL). Liver Function Tests:  Recent Labs Lab 10/31/16 0445  AST 26  ALT 19  ALKPHOS 228*  BILITOT 0.9  PROT 5.7*  ALBUMIN 2.5*   No results for input(s): LIPASE, AMYLASE in the last 168 hours. No results for input(s): AMMONIA in the last 168 hours. Coagulation Profile: No results for input(s): INR, PROTIME in the last 168 hours. Cardiac Enzymes: No results for input(s): CKTOTAL, CKMB, CKMBINDEX, TROPONINI in the last 168 hours. BNP (last 3 results) No results for input(s): PROBNP in the last 8760 hours. HbA1C: No results for input(s): HGBA1C in the last 72 hours. CBG:  Recent Labs Lab 10/28/16 2101 10/10/2016 0746 10/20/2016 1147  GLUCAP 121* 106* 149*   Lipid Profile: No results for input(s): CHOL, HDL, LDLCALC, TRIG, CHOLHDL, LDLDIRECT in the last 72 hours. Thyroid Function Tests: No results for input(s): TSH, T4TOTAL, FREET4, T3FREE, THYROIDAB in the last 72 hours. Anemia Panel: No results for input(s): VITAMINB12, FOLATE, FERRITIN, TIBC, IRON, RETICCTPCT in the last 72 hours. Sepsis Labs: No results for input(s): PROCALCITON, LATICACIDVEN in the last 168 hours.  No results found for this or any previous visit (from the past 240 hour(s)).   Radiology Studies: Ct Angio Head W Or Wo Contrast  Result Date: 10/31/2016 CLINICAL DATA:  Right facial droop and slurred speech. History of breast cancer. EXAM: CT ANGIOGRAPHY HEAD AND NECK  TECHNIQUE: Multidetector CT imaging of the head and neck was performed using the standard protocol during bolus administration of intravenous contrast. Multiplanar CT image reconstructions and MIPs were obtained to evaluate the vascular anatomy. Carotid stenosis measurements (when applicable) are obtained utilizing NASCET criteria, using the distal internal carotid diameter as the denominator. CONTRAST:  164mL ISOVUE-300 IOPAMIDOL (ISOVUE-300) INJECTION 61% COMPARISON:  Chest CTA 09/05/2016 FINDINGS: CTA NECK FINDINGS Aortic arch: Normal variant aortic arch branching pattern with aberrant right subclavian artery and common origin of the common carotid arteries. 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 Right carotid system: Patent without evidence of stenosis, dissection, or significant atherosclerosis. Left carotid system: Patent without  evidence of stenosis, dissection, or significant atherosclerosis. Partially retropharyngeal course of the proximal ICA. Vertebral arteries: Patent without evidence of stenosis, dissection, or significant atherosclerosis. Mildly dominant left vertebral artery. Skeleton: Interbody and posterior element ankylosis at C2-3 which may be congenital. Heterogeneous/patchy sclerotic appearance of multiple cervical and thoracic vertebrae as described on recent chest CTA suspicious for metastases. Other neck: No neck mass. Upper chest: Multiple large mediastinal masses as seen on the prior chest CTA. The largest measures 4.9 cm in the high right paratracheal region near the thoracic inlet and is similar in size to the prior CTA, while a left paratracheal/prevascular mass has enlarged and now measures 4.3 x 3.9 cm (previously 3.2 x 2.8 cm). There is wall thickening and debris in the proximal thoracic esophagus as before. Small pleural effusions are partially visualized. There is centrilobular emphysema. A left  internal mammary chain lymph node has enlarged and now measures 2.0 x 1.7 cm (previously 1.3 x 1.2 cm). Review of the MIP images confirms the above findings CTA HEAD FINDINGS Anterior circulation: The internal carotid arteries are patent from skullbase to carotid termini. There is minimal siphon atherosclerosis without stenosis. ACAs and MCAs are patent without evidence of proximal branch occlusion or significant stenosis. No aneurysm. Posterior circulation: The intracranial vertebral arteries are widely patent to the basilar. Patent AICA and SCA origins are visualized bilaterally. The basilar artery is widely patent. There are large posterior communicating arteries bilaterally with asymmetric hypoplasia of the left P1 segment. PCAs are patent without evidence of significant stenosis. No aneurysm. Venous sinuses: Patent. Anatomic variants: None of significance. Delayed phase: 2.2 cm enhancing anterior right frontal mass with mild vasogenic edema, favored to be intra-axial though this is not definite. Two smaller extra-axial masses nearby over the anterior right frontal convexity measure 1.7 cm and 1.4 cm, and there is a 9 mm enhancing extra-axial mass over the left anterior frontal convexity. Review of the MIP images confirms the above findings IMPRESSION: 1. No emergent large vessel occlusion or significant stenosis. 2. 2.2 cm right frontal mass with mild vasogenic edema. Two smaller right frontal masses and one left frontal mass are extra-axial and without edema. These are concerning for metastases. 3. Progressive metastatic thoracic lymphadenopathy. 4.  Emphysema (ICD10-J43.9). Preliminary findings of no large vessel occlusion and presence of intracranial masses were communicated via telephone to Dr. Leonel Ramsay on 10/31/2016 at 4:48 p.m. Electronically Signed   By: Logan Bores M.D.   On: 10/31/2016 17:44   Ct Angio Neck W Or Wo Contrast  Result Date: 10/31/2016 CLINICAL DATA:  Right facial droop and slurred  speech. History of breast cancer. EXAM: CT ANGIOGRAPHY HEAD AND NECK TECHNIQUE: Multidetector CT imaging of the head and neck was performed using the standard protocol during bolus administration of intravenous contrast. Multiplanar CT image reconstructions and MIPs were obtained to evaluate the vascular anatomy. Carotid stenosis measurements (when applicable) are obtained utilizing NASCET criteria, using the distal internal carotid diameter as the denominator. CONTRAST:  162mL ISOVUE-300 IOPAMIDOL (ISOVUE-300) INJECTION 61% COMPARISON:  Chest CTA 09/05/2016 FINDINGS: CTA NECK FINDINGS Aortic arch: Normal variant aortic arch branching pattern with aberrant right subclavian artery and common origin of the common carotid arteries. 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 Right carotid system: Patent without evidence of stenosis, dissection, or significant atherosclerosis. Left carotid system: Patent without evidence of stenosis, dissection, or significant atherosclerosis. Partially retropharyngeal course of the proximal ICA. Vertebral arteries:  Patent without evidence of stenosis, dissection, or significant atherosclerosis. Mildly dominant left vertebral artery. Skeleton: Interbody and posterior element ankylosis at C2-3 which may be congenital. Heterogeneous/patchy sclerotic appearance of multiple cervical and thoracic vertebrae as described on recent chest CTA suspicious for metastases. Other neck: No neck mass. Upper chest: Multiple large mediastinal masses as seen on the prior chest CTA. The largest measures 4.9 cm in the high right paratracheal region near the thoracic inlet and is similar in size to the prior CTA, while a left paratracheal/prevascular mass has enlarged and now measures 4.3 x 3.9 cm (previously 3.2 x 2.8 cm). There is wall thickening and debris in the proximal thoracic esophagus as before. Small pleural effusions  are partially visualized. There is centrilobular emphysema. A left internal mammary chain lymph node has enlarged and now measures 2.0 x 1.7 cm (previously 1.3 x 1.2 cm). Review of the MIP images confirms the above findings CTA HEAD FINDINGS Anterior circulation: The internal carotid arteries are patent from skullbase to carotid termini. There is minimal siphon atherosclerosis without stenosis. ACAs and MCAs are patent without evidence of proximal branch occlusion or significant stenosis. No aneurysm. Posterior circulation: The intracranial vertebral arteries are widely patent to the basilar. Patent AICA and SCA origins are visualized bilaterally. The basilar artery is widely patent. There are large posterior communicating arteries bilaterally with asymmetric hypoplasia of the left P1 segment. PCAs are patent without evidence of significant stenosis. No aneurysm. Venous sinuses: Patent. Anatomic variants: None of significance. Delayed phase: 2.2 cm enhancing anterior right frontal mass with mild vasogenic edema, favored to be intra-axial though this is not definite. Two smaller extra-axial masses nearby over the anterior right frontal convexity measure 1.7 cm and 1.4 cm, and there is a 9 mm enhancing extra-axial mass over the left anterior frontal convexity. Review of the MIP images confirms the above findings IMPRESSION: 1. No emergent large vessel occlusion or significant stenosis. 2. 2.2 cm right frontal mass with mild vasogenic edema. Two smaller right frontal masses and one left frontal mass are extra-axial and without edema. These are concerning for metastases. 3. Progressive metastatic thoracic lymphadenopathy. 4.  Emphysema (ICD10-J43.9). Preliminary findings of no large vessel occlusion and presence of intracranial masses were communicated via telephone to Dr. Leonel Ramsay on 10/31/2016 at 4:48 p.m. Electronically Signed   By: Logan Bores M.D.   On: 10/31/2016 17:44   Ct Abdomen Pelvis W  Contrast  Result Date: 10/31/2016 CLINICAL DATA:  Abdominal distension EXAM: CT ABDOMEN AND PELVIS WITH CONTRAST TECHNIQUE: Multidetector CT imaging of the abdomen and pelvis was performed using the standard protocol following bolus administration of intravenous contrast. CONTRAST:  100 mL Isovue 370 intravenous COMPARISON:  CT chest 09/05/2016 FINDINGS: Lower chest: Small bilateral pleural effusions. Mild passive atelectasis in the posterior lung bases. Normal heart size. Hepatobiliary: Heterogenous fatty infiltration. More focal heterogeneous ill-defined hypodensity near the falciform ligament. No calcified gallstones or biliary dilatation. Pancreas: Unremarkable. No pancreatic ductal dilatation or surrounding inflammatory changes. Spleen: Normal in size without focal abnormality. Adrenals/Urinary Tract: Adrenal glands are within normal limits. Delayed phase imaging of the kidneys. No hydronephrosis. The bladder is grossly unremarkable Stomach/Bowel: The stomach is nonenlarged. No dilated small bowel. Moderate stool in the colon. Normal appendix. No colon wall thickening. Vascular/Lymphatic: Aortic atherosclerosis. No aneurysmal dilatation. Pre aortic lymph node measuring 1 cm. Subcentimeter retroperitoneal lymph nodes. No significant pelvic adenopathy. Reproductive: Status post hysterectomy. No adnexal masses. Other: Negative for free air or free fluid. Musculoskeletal: Multiple lucent lesions within  the vertebral bodies and faint foci of sclerosis within the vertebral bodies. Right greater than left sclerosis of the iliac bone. Soft tissue thickening around the right posterior iliac bone with widening of the right SI joint. Mild irregular sclerosis at the inferior pubic rami and ischium. Fusion of the L4 and L5 vertebral bodies. IMPRESSION: 1. Negative for a bowel obstruction.  Moderate stool in the colon. 2. Small bilateral pleural effusions 3. Heterogenous fat infiltration of liver. Ill-defined hypodensity  near the falciform ligament, although location would suggest focal fat infiltration, consideration is also given to a metastatic lesion given its ill-defined appearance. 4. Small upper abdominal and periaortic lymph nodes. 5. Multiple lucent and sclerotic lesions within the vertebral bodies and pelvis consistent with skeletal metastatic disease. Electronically Signed   By: Donavan Foil M.D.   On: 10/31/2016 18:16    Scheduled Meds: . metoprolol tartrate  2.5 mg Intravenous Q6H   Continuous Infusions: . dextrose 5 % and 0.9 % NaCl with KCl 40 mEq/L 75 mL/hr (11/02/16 0205)     LOS: 4 days   Darrah Dredge, Orpah Melter, MD [facility] Pager [phone]  If 7PM-7AM, please contact night-coverage [email or web address] Password [email or web address] 11/02/2016, 4:49 PM

## 2016-11-02 NOTE — Progress Notes (Signed)
Pt continues to have sutures present L breast area.

## 2016-11-03 MED ORDER — LORAZEPAM 2 MG/ML IJ SOLN: 1.0000 mg | INTRAMUSCULAR | Status: DC | PRN

## 2016-11-03 MED ORDER — ATROPINE SULFATE 1 % OP SOLN
2.0000 [drp] | Freq: Three times a day (TID) | OPHTHALMIC | Status: DC | PRN
  Filled 2016-11-03: qty 2

## 2016-11-03 NOTE — Care Management Note (Signed)
Case Management Note  Patient Details  Name: [name] MRN: [id number] Date of Birth: [birth year]  Subjective/Objective: Palliative recc residential hospice-CSW already following.                   Action/Plan:d/c residential hospice.   Expected Discharge Date:  11/01/16               Expected Discharge Plan:  [place]  In-House Referral:  Clinical Social Work  Discharge planning Services  CM Consult  Post Acute Care Choice:    Choice offered to:     DME Arranged:    DME Agency:     HH Arranged:    [place] Agency:     Status of Service:  Completed, signed off  If discussed at [employer], dates discussed:    Additional Comments:  Dessa Phi, RN 11/03/2016, 12:46 PM

## 2016-11-03 NOTE — Evaluation (Signed)
SLP Cancellation Note  Patient Details Name: [name] MRN: [id number] DOB: February 10, 1945   Cancelled treatment:       Reason Eval/Treat Not Completed: Fatigue/lethargy limiting ability to participate (pt with lethargy, did not awaken adequately for po or evaluation, will continue efforts)   Luanna Salk, [facility] SLP [phone]

## 2016-11-03 NOTE — Progress Notes (Signed)
PROGRESS NOTE    [name]  XNA:[id number] DOB: Nov 02, 1945 DOA: 10/23/2016 PCP: Patient, No Pcp Per    Brief Narrative:  71 y.o. female with history of metastatic breast cancer was a recent recurrence who was admitted to [facility] for dehydration and was discharged yesterday and was taken home. At home patient's friend found that patient was weak and not doing well. Parents also noticed that patient had some blood in the underpants.  Assessment & Plan:   Principal Problem:   Acute GI bleeding Active Problems:   Essential hypertension   Non-ischemic cardiomyopathy (HCC)   Acute blood loss anemia   Hypernatremia   Goals of care, counseling/discussion   Encounter for hospice care discussion   Altered mental status   Malignancy ([place])   1. BRBPR/ GI bleed  1. Hgb has remained overall stable, denying abdominal pain 2. Nursing staff reports patient passing bright red blood with clots recently 3. Appreciate input by GI. Given poor overall prognosis, no further GI work up was recommended 4. Plan to focus on comfort 2. Dehydration with hypernatremia 1. Patient is continued on IV fluid hydration. 2. Remains stable at present 3. Metastatic breast cancer 1. was evaluated by oncologist 1 day prior to admission and recommended no systemic therapy given the patient's functional status.  2. Palliative care consulted, appreciate input. 3. Prognosis noted to be 39mos prior to admission 4. Given events of recent CVA, see below, suspect prognosis is now much worse 5. Appreciate input by Palliative Care. Discussed case with Dr. Domingo Cocking. Following family meeting, patients wishes are for focus on comfort with plans for residential hospice 6. Discharge planning in progress. No beds available at pt's home county 4. History of nonischemic cardiomyopathy 1. Remains stable currently 5. Hypertension 1. Continue on metoprolol with hold parameters 2. Stable currently 6. Acute CVA 1. See  events from 9/28. Patient noted to have acute slurred speech and facial droop. Code stroke called and patient determined to have acute CVA 2. Neurology recommendations noted for no further work up given already poor prognosis 3. No ASA given concerns of GI bleeding 4. Seen by palliative care. Plans for transition to residential hospice. D/c planning in progress  DVT prophylaxis: SCD's Code Status: DNR Family Communication: Pt in room, family not at bedsie Disposition Plan: Uncertain at this time  Consultants:   Palliative Care  Gastroenterology  Neurology  Procedures:     Antimicrobials: Anti-infectives    None      Subjective: Cannot obtain. Pleasantly confused  Objective: Vitals:   11/02/16 1528 11/02/16 2119 11/03/16 0006 11/03/16 0611  BP: 113/60 120/61 107/60 (!) 107/56  Pulse: (!) 120 (!) 123 (!) 124 (!) 135  Resp: 18 18  17   Temp: 98.6 F (37 C) 98.8 F (37.1 C)  99.6 F (37.6 C)  TempSrc: Axillary Oral  Oral  SpO2: 98% 94%  93%  Weight:      Height:        Intake/Output Summary (Last 24 hours) at 11/03/16 1327 Last data filed at 11/03/16 1157  Gross per 24 hour  Intake             1820 ml  Output                0 ml  Net             1820 ml   Filed Weights   10/17/2016 2234  Weight: 53.3 kg (117 lb 8.1 oz)  Examination: General exam: laying in bed, in no acute distress Respiratory system: normal chest rise, clear, no audible wheezing Cardiovascular system: regular rhythm, s1-s2 Gastrointestinal system: Nondistended, nontender, pos BS Central nervous system: No seizures, no tremors Extremities: No cyanosis, no joint deformities Skin: No rashes, no pallor Psychiatry: Unable to assess   Data Reviewed: I have personally reviewed following labs and imaging studies  CBC:  Recent Labs Lab 10/08/2016 2019  10/30/16 0535 10/30/16 0812 10/31/16 0445 10/31/16 1710 11/01/16 0910  WBC 8.3  < > 8.5 8.0 7.6 8.3 8.4  NEUTROABS 6.1  --   --   --    --   --   --   HGB 7.7*  < > 7.7* 8.1* 7.6* 7.5* 7.7*  HCT 24.6*  < > 24.6* 25.9* 24.1* 23.7* 24.8*  MCV 86.0  < > 84.5 85.5 82.5 83.7 83.5  PLT 152  < > 133* 138* 135* 128* 139*  < > = values in this interval not displayed. Basic Metabolic Panel:  Recent Labs Lab 10/22/2016 0552 10/08/2016 2019 10/30/16 0535 10/31/16 0445 11/01/16 0910  NA 150* 148* 147* 141 141  K 3.5 3.3* 3.4* 3.1* 2.8*  CL 108 110 110 104 101  CO2 27 26 25 27 27   GLUCOSE 114* 119* 127* 114* 102*  BUN 19 17 15 12 11   CREATININE 0.58 0.50 0.41* 0.45 0.46  CALCIUM 8.8* 9.0 8.8* 8.6* 8.1*   GFR: Estimated Creatinine Clearance: 53.5 mL/min (by C-G formula based on SCr of 0.46 mg/dL). Liver Function Tests:  Recent Labs Lab 10/31/16 0445  AST 26  ALT 19  ALKPHOS 228*  BILITOT 0.9  PROT 5.7*  ALBUMIN 2.5*   No results for input(s): LIPASE, AMYLASE in the last 168 hours. No results for input(s): AMMONIA in the last 168 hours. Coagulation Profile: No results for input(s): INR, PROTIME in the last 168 hours. Cardiac Enzymes: No results for input(s): CKTOTAL, CKMB, CKMBINDEX, TROPONINI in the last 168 hours. BNP (last 3 results) No results for input(s): PROBNP in the last 8760 hours. HbA1C: No results for input(s): HGBA1C in the last 72 hours. CBG:  Recent Labs Lab 10/28/16 2101 10/26/2016 0746 10/08/2016 1147  GLUCAP 121* 106* 149*   Lipid Profile: No results for input(s): CHOL, HDL, LDLCALC, TRIG, CHOLHDL, LDLDIRECT in the last 72 hours. Thyroid Function Tests: No results for input(s): TSH, T4TOTAL, FREET4, T3FREE, THYROIDAB in the last 72 hours. Anemia Panel: No results for input(s): VITAMINB12, FOLATE, FERRITIN, TIBC, IRON, RETICCTPCT in the last 72 hours. Sepsis Labs: No results for input(s): PROCALCITON, LATICACIDVEN in the last 168 hours.  No results found for this or any previous visit (from the past 240 hour(s)).   Radiology Studies: No results found.  Scheduled Meds: . metoprolol  tartrate  2.5 mg Intravenous Q6H   Continuous Infusions: . dextrose 5 % and 0.9 % NaCl with KCl 40 mEq/L 75 mL/hr at 11/03/16 0737     LOS: 5 days   CHIU, Orpah Melter, MD [facility] Pager [phone]  If 7PM-7AM, please contact night-coverage [email or web address] Password [email or web address] 11/03/2016, 1:27 PM

## 2016-11-03 NOTE — Clinical Social Work Note (Signed)
Clinical Social Work Assessment  Patient Details  Name: [name] MRN: [id number] Date of Birth: [birth year]  Date of referral:  11/03/16               Reason for consult:  Facility Placement, End of Life/Hospice                Permission sought to share information with:  Facility Art therapist granted to share information::  Yes, Verbal Permission Granted  Name::        Agency::     Relationship::     Contact Information:     Housing/Transportation Living arrangements for the past 2 months:  Apartment Source of Information:  Other (Comment Required) (Family niece [name] and [name]) Patient Interpreter Needed:  None Criminal Activity/Legal Involvement Pertinent to Current Situation/Hospitalization:  No - Comment as needed Significant Relationships:  Other Family Members (nieces) Lives with:  Self Do you feel safe going back to the place where you live?    Need for family participation in patient care:  Yes (Comment)  Care giving concerns:  CSW consulted for SNF placement. Patient from home alone with history of metastatic breast cancer, palliative recommending residential hospice.    Social Worker assessment / plan:  CSW informed by [facility] that patient's family is interested in residential hospice. CSW contacted by Palliative NP Aniceto Boss from [facility] and informed that [place] [facility] did not have any beds available. Palliative NP reported that she had been in contact with patient's niece [name] [phone]) who is patient's decision maker and would update her about bed availability. CSW contacted patient's niece [name] who confirmed plan for dc to residential hospice, patient reported that her second choice was [facility]. CSW agreed to notify [facility] liaision about family's interest in [facility].   CSW contacted [place], awaiting return call.   Employment status:  Retired Nurse, adult PT  Recommendations:  Not assessed at this time Information / Referral to community resources:  Other (Comment Required) (Residential Hospice)  Patient/Family's Response to care:  Patient's family agreeable to residential hospice.   Patient/Family's Understanding of and Emotional Response to Diagnosis, Current Treatment, and Prognosis:  Patient was asleep and not able to participate in assessment. Patient's niece is from [place] and has been driving back and forth to participate in patient's care. Patient's niece verbalized understanding of patient's care and verbalized plan to dc to residential hospice.   Emotional Assessment Appearance:  Appears older than stated age Attitude/Demeanor/Rapport:  Unable to Assess (Patient asleep) Affect (typically observed):  Unable to Assess (Patient asleep) Orientation:  Oriented to Self Alcohol / Substance use:  Not Applicable Psych involvement (Current and /or in the community):  No (Comment)  Discharge Needs  Concerns to be addressed:  Care Coordination Readmission within the last 30 days:  Yes Current discharge risk:  None Barriers to Discharge:  Hospice Bed not available   Burnis Medin, LCSW 11/03/2016, 2:51 PM

## 2016-11-03 NOTE — Progress Notes (Signed)
PT Cancellation Note  Patient Details Name: [name] MRN: [id number] DOB: 25-Nov-1945   Cancelled Treatment:    Reason Eval/Treat Not Completed: Per palliative care, recommendation is now for comfort care and residential hospice. Will sign off at this time.    Weston Anna, MPT Pager: [phone]

## 2016-11-03 NOTE — Progress Notes (Signed)
CSW notified by [facility] liaison Harmon Pier that they would be able to accept patient tomorrow. CSW provided [facility] liaison with contact information for patient's niece that lives locally Odette Horns [phone]) to complete paperwork.    CSW provided update to patient's attending MD.   CSW provided update to patient's niece [name] [phone]), patient's niece thanked CSW for assistance with residential hospice placement.   CSW will continue to follow and assist with dc planning.   Abundio Miu, [place] Social Worker [facility] Cell#: [phone]

## 2016-11-03 NOTE — Progress Notes (Signed)
Provider contacted via pager system due decreased BP. 88/40. Will reevaluate.

## 2016-11-03 NOTE — Progress Notes (Signed)
OT Cancellation Note  Patient Details Name: [name] MRN: [id number] DOB: 02-01-1946   Cancelled Treatment:    Reason Eval/Treat Not Completed: Other (comment)  Per palliative care, recommendation is now for comfort care and residential hospice. Will sign off at this time.    Betsy Pries 11/03/2016, 12:57 PM

## 2016-11-03 NOTE — Consult Note (Signed)
[facility] [employer]  Received request from [place] for family interest in [facility]. Chart reviewed and room offered for tomorrow. Spoke with Lennie Muckle by phone to confirm cousin Research officer, trade union can complete paper work on her behalf. [name] provided financial information and requested that [facility] MD Orpah Melter assume care. Met with [place] who completed paper work in anticipation of transfer tomorrow if transfer still makes sense.   Will need discharge summary faxed to [phone].  RN please call report to [phone].  Thank you.  Erling Conte, [place]

## 2016-11-03 NOTE — Progress Notes (Signed)
Daily Progress Note   Patient Name: [name]       Date: 11/03/2016 DOB: 1945-08-28  Age: 71 y.o. MRN#: [id number] Attending Physician: Donne Hazel, MD Primary Care Physician: Patient, No Pcp Per Admit Date: 10/11/2016  Reason for Consultation/Follow-up: Establishing goals of care, Inpatient hospice referral and Psychosocial/spiritual support  Subjective: Coordination of care from [facility]. Case discussed with Dr. Micheline Rough who saw patient and family yesterday.   Call to social worker on duty today, Joelene Millin.  Call to Niece/responsible party, [employer]. Detailed voicemail message regarding hospice home placement, psychosocial support offered. Unfortunately, [name] home county, [place], hospice home does not have beds available at this time.   Return call to social worker to update on the placement choices.   Length of Stay: 5  Current Medications: Scheduled Meds:  . metoprolol tartrate  2.5 mg Intravenous Q6H    Continuous Infusions: . dextrose 5 % and 0.9 % NaCl with KCl 40 mEq/L 75 mL/hr at 11/03/16 0737    PRN Meds: acetaminophen **OR** acetaminophen, diphenhydrAMINE, iopamidol, labetalol, LORazepam, ondansetron **OR** ondansetron (ZOFRAN) IV  Physical Exam  Nursing note and vitals reviewed.           Vital Signs: BP (!) 107/56 (BP Location: Left Arm)   Pulse (!) 135   Temp 99.6 F (37.6 C) (Oral)   Resp 17   Ht 5\' 7"  (1.702 m)   Wt 53.3 kg (117 lb 8.1 oz)   SpO2 93%   BMI 18.40 kg/m  SpO2: SpO2: 93 % O2 Device: O2 Device: Nasal Cannula O2 Flow Rate: O2 Flow Rate (L/min): 2 L/min  Intake/output summary:  Intake/Output Summary (Last 24 hours) at 11/03/16 1100 Last data filed at 11/03/16 0600  Gross per 24 hour  Intake              1820 ml  Output                0 ml  Net             1820 ml   LBM: Last BM Date: 11/01/16 Baseline Weight: Weight: 53.3 kg (117 lb 8.1 oz) Most recent weight: Weight: 53.3 kg (117 lb 8.1 oz)       Palliative Assessment/Data:    Flowsheet Rows  Most Recent Value  Intake Tab  Referral Department  Hospitalist  Unit at Time of Referral  Cardiac/Telemetry Unit  Palliative Care Primary Diagnosis  Other (Comment)  Date Notified  10/30/16  Palliative Care Type  New Palliative care  Date of Admission  10/30/16  Date first seen by Palliative Care  10/30/16  # of days Palliative referral response time  0 Day(s)  # of days IP prior to Palliative referral  0  Clinical Assessment  Palliative Performance Scale Score  40%  Pain Max last 24 hours  Not able to report  Pain Min Last 24 hours  Not able to report  Dyspnea Max Last 24 Hours  Not able to report  Dyspnea Min Last 24 hours  Not able to report  Psychosocial & Spiritual Assessment  Palliative Care Outcomes  Patient/Family meeting held?  Yes  Who was at the meeting?  patient at bedside      Patient Active Problem List   Diagnosis Date Noted  . Altered mental status   . Malignancy ([place])   . Goals of care, counseling/discussion   . Encounter for hospice care discussion   . Acute GI bleeding 10/04/2016  . Acute blood loss anemia 10/04/2016  . Hypernatremia 11/02/2016  . Palliative care encounter   . Thrush 10/28/2016  . Dehydration 10/13/2016  . Protein-calorie malnutrition, severe 10/11/2016  . Sinus tachycardia 10/10/2016  . Hypovolemia associated with vomiting 10/10/2016  . Breast mass, left 09/22/2016  . Non-ischemic cardiomyopathy ([place]) 11/30/2010  . Sepsis due to Staphylococcus aureus ([place]) 11/28/2010  . 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 Shoulder region pain 11/25/2010  . Tobacco abuse 11/25/2010  . Hypokalemia 11/25/2010  . Anemia 11/25/2010  . ANXIETY 01/01/2006  . Essential  hypertension 01/01/2006  . CONGESTIVE HEART FAILURE 01/01/2006  . COPD 01/01/2006  . LOW BACK PAIN 01/01/2006  . BREAST CANCER, HX OF 01/01/2006    Palliative Care Assessment & Plan   Patient Profile: 71 y.o. female  with past medical history of hypertension, remote breast cancer, new diagnosis of mediastinal mass with esophageal displacement admitted on 10/23/2016 with likely diverticulum bleed.   Assessment: diverticulum bleed: complicated by stage IV breast cancer without treatment being offered and now stroke. Family is requesting comfort measure.   Recommendations/Plan:  Family is requesting inpatient hospice services.  Unfortunately, there are no beds [facility], [name] home county.  Goals of Care and Additional Recommendations:  Limitations on Scope of Treatment: Full Comfort Care  Code Status:    Code Status Orders        Start     Ordered   10/30/16 1405  Do not attempt resuscitation (DNR)  Continuous    Question Answer Comment  In the event of cardiac or respiratory ARREST Do not call a "code blue"   In the event of cardiac or respiratory ARREST Do not perform Intubation, CPR, defibrillation or ACLS   In the event of cardiac or respiratory ARREST Use medication by any route, position, wound care, and other measures to relive pain and suffering. May use oxygen, suction and manual treatment of airway obstruction as needed for comfort.      10/30/16 1405    Code Status History    Date Active Date Inactive Code Status Order ID Comments User Context   10/30/2016 11:25 PM 10/30/2016  2:05 PM Full Code [id number]  Rise Patience, MD ED   10/28/2016  8:41 PM 10/24/2016  5:52 PM  Full Code [id number]  Karmen Bongo, MD Inpatient   10/11/2016  4:16 PM 10/17/2016  4:56 PM DNR [id number]  Cherene Altes, MD Inpatient   10/10/2016  9:57 PM 10/11/2016  4:16 PM Full Code [id number]  Vianne Bulls, MD ED   11/26/2010 12:16 AM 12/03/2010  6:34 PM  Full Code [id number]  Bunch-Wilson, Alinda Money, RN Inpatient    Advance Directive Documentation     Most Recent Value  Type of Advance Directive  Living will  Pre-existing out of facility DNR order (yellow form or pink MOST form)  -  "MOST" Form in Place?  -       Prognosis:   < 2 weeks  Discharge Planning:  [name] is requesting inpatient hospice services.  Care plan was discussed with Education officer, museum.   Thank you for allowing the Palliative Medicine Team to assist in the care of this patient.   Time In: 1045 Time Out: 1110 Total Time 25 minutes Prolonged Time Billed  no       Greater than 50%  of this time was spent counseling and coordinating care related to the above assessment and plan.  Drue Novel, NP  Please contact Palliative Medicine Team phone at [phone] for questions and concerns.

## 2016-11-03 NOTE — Evaluation (Signed)
SLP Cancellation Note  Patient Details Name: [name] MRN: [id number] DOB: 08-24-1945   Cancelled treatment:       Reason Eval/Treat Not Completed: Other (comment) (pt now full comfort, not adequately alert today for po, will sign off, please reorder if desire)  Luanna Salk, [facility] SLP [phone]

## 2016-11-03 NOTE — Progress Notes (Signed)
Palliative care progress note  Reason for encounter: Goals of care in light of breast cancer and new onset stroke with dysphagia  I met today with Ms. Krotz and her family. She remains unable to participate in goals conversation.  I discussed with her niece, [name], as well as several other family members (cousins) who were present. We have not been able to get in touch with Ms. Mckoy daughter, and [name] reports that she has heard from her via text and she is not interested in participating in decision-making process.  Values and goals of care important to patient and family were attempted to be elicited.  We discussed clinical course over the past few days including new development of stroke with complications of dysphagia and likely aspiration.We discussed difference between a aggressive medical intervention path and a palliative, comfort focused care path.    Her family is in agreement that goal moving forward should be to ensure that she has comfort and dignity as she approaches the end of her life. We discussed a plan to have her evaluated by speech therapy in order for them to help [place] better characterize the severity of her dysphagia and aspiration. It appears to me today that she is grossly aspirating on her secretions and I believe this will continue to be the case.  With these new developments, I believe that her likely prognosis is less than 2 weeks due to the fact that she is no longer taking in nutrition or hydration and she is also likely aspirating on her own secretions. I discussed with family about options for comfort care and they are in agreement that she would likely best be served by placement at residential hospice facility. We will wait input by speech therapy, but I recommended that we begin process of evaluation for residential hospice after we get their input.  Questions and concerns addressed.   PMT will continue to support holistically.  Total time: 50  minutes  Greater than 50%  of this time was spent counseling and coordinating care related to the above assessment and plan.  Micheline Rough, MD [place] Team [phone]

## 2016-11-03 NOTE — Plan of Care (Signed)
Problem: Respiratory: Goal: Verbalizations of increased ease of respirations will increase Outcome: Completed/Met Date Met: 11/03/16 Discussed with family at bedside. Will continue to monitor

## 2016-11-03 NOTE — Progress Notes (Signed)
Nutrition Brief Note  Pt screened on low [place] screening report. Chart reviewed. She was recently discharged from [facility]. She has hx of metastatic breast cancer. Palliative Care met with pt's niece yesterday afternoon. Per note, plan is for SLP evaluation (SLP attempted this AM but pt was too lethargic/unresponsive to participate). Palliative Care note states prognosis of likely <2 weeks given no PO intakes and likely aspiration on own secretions. Family in agreement with comfort care focus and placement at residential hospice facility.  Pt now transitioning to comfort care.  No nutrition interventions warranted at this time.  Please re-consult as needed.     Jarome Matin, MS, RD, LDN, [facility] Inpatient Clinical Dietitian Pager # [phone] After hours/weekend pager # [phone]

## 2016-11-03 DEATH — deceased

## 2016-11-06 ENCOUNTER — Ambulatory Visit (HOSPITAL_COMMUNITY): Payer: Medicare HMO

## 2016-12-04 NOTE — Progress Notes (Addendum)
On exam the patient was absent of breath sounds and heart sounds. The patient did not respond to verbal or physical stimuli. Absent peripheral pulses. Pupils are fixed and dilated. Patient pronounced dead at 1038. Hilary Holderness RN, Camera operator was the second nurse to pronounce dead. Attending provider notified. Next of kin/family was at bedside (niece). Left voicemail for daughter to contact hospital Salli Real). Number is in emergency contact.

## 2016-12-04 NOTE — Care Management Note (Signed)
Case Management Note  Patient Details  Name: [name] MRN: [id number] Date of Birth: [birth year]  Subjective/Objective:                    Action/Plan:Expired   Expected Discharge Date:  11/01/16               Expected Discharge Plan:  Expired  In-House Referral:  Clinical Social Work  Discharge planning Services  CM Consult  Post Acute Care Choice:    Choice offered to:     DME Arranged:    DME Agency:     HH Arranged:    [place] Agency:     Status of Service:  Completed, signed off  If discussed at [employer], dates discussed:    Additional Comments:  Dessa Phi, RN Dec 02, 2016, 11:43 AM

## 2016-12-04 NOTE — Discharge Summary (Signed)
Death Summary  [name] GNF:[id number] DOB: 1945/08/18 DOA: 06-Nov-2016  PCP: Patient, No Pcp Per  Admit date: 11-06-2016 Date of Death: 11/12/16 Time of Death: 05-09-1036 Notification: Patient, No Pcp Per notified of death of 12-Nov-2016   History of present illness:  71 y.o.femalewith history of metastatic breast cancer was a recent recurrence who was admitted to [facility] for dehydration and was discharged yesterday and was taken home. At home patient's friend found that patient was weak and not doing well. Parents also noticed that patient had some blood in the underpants.  1. BRBPR/ GI bleed  1. Hgb has remained overall stable, denying abdominal pain 2. Nursing staff reports patient passing bright red blood with clots recently 3. Appreciate input by GI. Given poor overall prognosis, no further GI work up was recommended 4. Patient was continued on comfort measures 2. Dehydration with hypernatremia 1. Patient was continued on IV fluid hydration. 2. Continued on comfort measures 3. Metastatic breast cancer 1. was evaluated by oncologist 1 day prior to admission and recommended no systemic therapy given the patient's functional status.  2. Palliative care consulted, appreciate input. 3. Prognosis noted to be 39mos prior to admission 4. Given events of recent CVA, see below, suspect prognosis is now much worse 5. Appreciate input by Palliative Care. Discussed case with Dr. Domingo Cocking. Following family meeting, patients wishes are for focus on comfort 4. History of nonischemic cardiomyopathy 1. On comfort measures 5. Hypertension 1. Continued on metoprolol with hold parameters 2. Comfort measures 6. Acute CVA 1. See events from 9/28. Patient noted to have acute slurred speech and facial droop. Code stroke called and patient determined to have acute CVA 2. Neurology recommendations noted for no further work up given already poor prognosis 3. No ASA given concerns of GI  bleeding 4. Seen by palliative care. While awaiting transfer to residential hospice, patient was found without spontaneous respirations or pulse. Patient was pronounced at 1038  Final Diagnoses:  Principal Problem:   Acute GI bleeding Active Problems:   Essential hypertension   Non-ischemic cardiomyopathy (HCC)   Acute blood loss anemia   Hypernatremia   Goals of care, counseling/discussion   Encounter for hospice care discussion   Altered mental status   Malignancy ([place])   Acute CVA   The results of significant diagnostics from this hospitalization (including imaging, microbiology, ancillary and laboratory) are listed below for reference.    Significant Diagnostic Studies: Ct Angio Head W Or Wo Contrast  Result Date: 10/31/2016 CLINICAL DATA:  Right facial droop and slurred speech. History of breast cancer. EXAM: CT ANGIOGRAPHY HEAD AND NECK TECHNIQUE: Multidetector CT imaging of the head and neck was performed using the standard protocol during bolus administration of intravenous contrast. Multiplanar CT image reconstructions and MIPs were obtained to evaluate the vascular anatomy. Carotid stenosis measurements (when applicable) are obtained utilizing NASCET criteria, using the distal internal carotid diameter as the denominator. CONTRAST:  126mL ISOVUE-300 IOPAMIDOL (ISOVUE-300) INJECTION 61% COMPARISON:  Chest CTA 09/05/2016 FINDINGS: CTA NECK FINDINGS Aortic arch: Normal variant aortic arch branching pattern with aberrant right subclavian artery and common origin of the common carotid arteries. 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 Right carotid system: Patent without evidence of stenosis, dissection, or significant atherosclerosis. Left carotid system: Patent without evidence of stenosis, dissection, or significant atherosclerosis. Partially retropharyngeal course of the proximal ICA. Vertebral  arteries: Patent without evidence of stenosis, dissection,  or significant atherosclerosis. Mildly dominant left vertebral artery. Skeleton: Interbody and posterior element ankylosis at C2-3 which may be congenital. Heterogeneous/patchy sclerotic appearance of multiple cervical and thoracic vertebrae as described on recent chest CTA suspicious for metastases. Other neck: No neck mass. Upper chest: Multiple large mediastinal masses as seen on the prior chest CTA. The largest measures 4.9 cm in the high right paratracheal region near the thoracic inlet and is similar in size to the prior CTA, while a left paratracheal/prevascular mass has enlarged and now measures 4.3 x 3.9 cm (previously 3.2 x 2.8 cm). There is wall thickening and debris in the proximal thoracic esophagus as before. Small pleural effusions are partially visualized. There is centrilobular emphysema. A left internal mammary chain lymph node has enlarged and now measures 2.0 x 1.7 cm (previously 1.3 x 1.2 cm). Review of the MIP images confirms the above findings CTA HEAD FINDINGS Anterior circulation: The internal carotid arteries are patent from skullbase to carotid termini. There is minimal siphon atherosclerosis without stenosis. ACAs and MCAs are patent without evidence of proximal branch occlusion or significant stenosis. No aneurysm. Posterior circulation: The intracranial vertebral arteries are widely patent to the basilar. Patent AICA and SCA origins are visualized bilaterally. The basilar artery is widely patent. There are large posterior communicating arteries bilaterally with asymmetric hypoplasia of the left P1 segment. PCAs are patent without evidence of significant stenosis. No aneurysm. Venous sinuses: Patent. Anatomic variants: None of significance. Delayed phase: 2.2 cm enhancing anterior right frontal mass with mild vasogenic edema, favored to be intra-axial though this is not definite. Two smaller extra-axial masses nearby over the  anterior right frontal convexity measure 1.7 cm and 1.4 cm, and there is a 9 mm enhancing extra-axial mass over the left anterior frontal convexity. Review of the MIP images confirms the above findings IMPRESSION: 1. No emergent large vessel occlusion or significant stenosis. 2. 2.2 cm right frontal mass with mild vasogenic edema. Two smaller right frontal masses and one left frontal mass are extra-axial and without edema. These are concerning for metastases. 3. Progressive metastatic thoracic lymphadenopathy. 4.  Emphysema (ICD10-J43.9). Preliminary findings of no large vessel occlusion and presence of intracranial masses were communicated via telephone to Dr. Leonel Ramsay on 10/31/2016 at 4:48 p.m. Electronically Signed   By: Logan Bores M.D.   On: 10/31/2016 17:44   Dg Chest 1 View  Result Date: 10/13/2016 CLINICAL DATA:  Shortness of breath and hypertension. History of breast carcinoma EXAM: CHEST 1 VIEW COMPARISON:  Chest radiograph and chest CT September 04, 2016 FINDINGS: There is a small right pleural effusion. There is mild bibasilar atelectasis. No edema or consolidation. Heart size and pulmonary vascularity are normal. Apparent adenopathy in the right paratracheal region persists, better seen on prior CT. No new adenopathy appreciable by radiography. Patient has had previous mastectomies bilaterally with surgical clips in each axillary region. No bone lesions are appreciable. IMPRESSION: Small right pleural effusion with bibasilar atelectasis. No consolidation. Right peritracheal region adenopathy, appearing grossly stable and better seen on prior CT. Stable cardiac silhouette. There is aortic atherosclerosis. Surgical clips noted in each axillary region. Aortic Atherosclerosis (ICD10-I70.0). Electronically Signed   By: Lowella Grip III M.D.   On: 10/13/2016 11:36   Ct Head Wo Contrast  Result Date: 10/31/2016 CLINICAL DATA:  Right facial droop. Slurred speech. Possible brainstem infarct. History  of breast cancer. EXAM: CT HEAD WITHOUT CONTRAST TECHNIQUE: Contiguous axial images were obtained from the base of the skull through the vertex without  intravenous contrast. COMPARISON:  None. FINDINGS: Brain: There are small bilateral parietal cortical infarcts. The right parietal infarct is larger than the left and has more of a chronic appearance. The left parietal infarct may be subacute or chronic. No definite acute large territory infarct is identified. There is a small region of low density in the white matter of the anterior right frontal lobe consistent with vasogenic edema with evidence of an associated 2 cm hyperattenuating soft tissue mass located along the gray-white junction. Two additional smaller soft tissue masses over the anterior right frontal convexity are more extra-axial in appearance. No acute intracranial hemorrhage, midline shift, or extra-axial fluid collection is seen. The ventricles are normal. Vascular: No hyperdense vessel. Skull: No fracture or focal osseous lesion. Sinuses/Orbits: Paranasal sinuses and mastoid air cells are clear. Unremarkable orbits. Other: 1.9 cm intermediate density the nodule in the left temporoparietal scalp, indeterminate. IMPRESSION: 1. No intracranial hemorrhage or definite acute infarct. 2. Small bilateral parietal cortical infarcts, favored to be chronic or possibly subacute. 3. Multiple right frontal masses measuring up to 2 cm in size with mild edema. These will be more fully evaluated on pending CTA. These results were called by telephone at the time of interpretation on 10/31/2016 at 4:48 pm to Dr. Leonel Ramsay, who verbally acknowledged these results. Electronically Signed   By: Logan Bores M.D.   On: 10/31/2016 16:58   Ct Angio Neck W Or Wo Contrast  Result Date: 10/31/2016 CLINICAL DATA:  Right facial droop and slurred speech. History of breast cancer. EXAM: CT ANGIOGRAPHY HEAD AND NECK TECHNIQUE: Multidetector CT imaging of the head and neck was  performed using the standard protocol during bolus administration of intravenous contrast. Multiplanar CT image reconstructions and MIPs were obtained to evaluate the vascular anatomy. Carotid stenosis measurements (when applicable) are obtained utilizing NASCET criteria, using the distal internal carotid diameter as the denominator. CONTRAST:  173mL ISOVUE-300 IOPAMIDOL (ISOVUE-300) INJECTION 61% COMPARISON:  Chest CTA 09/05/2016 FINDINGS: CTA NECK FINDINGS Aortic arch: Normal variant aortic arch branching pattern with aberrant right subclavian artery and common origin of the common carotid arteries. 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 Right carotid system: Patent without evidence of stenosis, dissection, or significant atherosclerosis. Left carotid system: Patent without evidence of stenosis, dissection, or significant atherosclerosis. Partially retropharyngeal course of the proximal ICA. Vertebral arteries: Patent without evidence of stenosis, dissection, or significant atherosclerosis. Mildly dominant left vertebral artery. Skeleton: Interbody and posterior element ankylosis at C2-3 which may be congenital. Heterogeneous/patchy sclerotic appearance of multiple cervical and thoracic vertebrae as described on recent chest CTA suspicious for metastases. Other neck: No neck mass. Upper chest: Multiple large mediastinal masses as seen on the prior chest CTA. The largest measures 4.9 cm in the high right paratracheal region near the thoracic inlet and is similar in size to the prior CTA, while a left paratracheal/prevascular mass has enlarged and now measures 4.3 x 3.9 cm (previously 3.2 x 2.8 cm). There is wall thickening and debris in the proximal thoracic esophagus as before. Small pleural effusions are partially visualized. There is centrilobular emphysema. A left internal mammary chain lymph node has enlarged and now  measures 2.0 x 1.7 cm (previously 1.3 x 1.2 cm). Review of the MIP images confirms the above findings CTA HEAD FINDINGS Anterior circulation: The internal carotid arteries are patent from skullbase to carotid termini. There is minimal siphon atherosclerosis without stenosis. ACAs and MCAs are  patent without evidence of proximal branch occlusion or significant stenosis. No aneurysm. Posterior circulation: The intracranial vertebral arteries are widely patent to the basilar. Patent AICA and SCA origins are visualized bilaterally. The basilar artery is widely patent. There are large posterior communicating arteries bilaterally with asymmetric hypoplasia of the left P1 segment. PCAs are patent without evidence of significant stenosis. No aneurysm. Venous sinuses: Patent. Anatomic variants: None of significance. Delayed phase: 2.2 cm enhancing anterior right frontal mass with mild vasogenic edema, favored to be intra-axial though this is not definite. Two smaller extra-axial masses nearby over the anterior right frontal convexity measure 1.7 cm and 1.4 cm, and there is a 9 mm enhancing extra-axial mass over the left anterior frontal convexity. Review of the MIP images confirms the above findings IMPRESSION: 1. No emergent large vessel occlusion or significant stenosis. 2. 2.2 cm right frontal mass with mild vasogenic edema. Two smaller right frontal masses and one left frontal mass are extra-axial and without edema. These are concerning for metastases. 3. Progressive metastatic thoracic lymphadenopathy. 4.  Emphysema (ICD10-J43.9). Preliminary findings of no large vessel occlusion and presence of intracranial masses were communicated via telephone to Dr. Leonel Ramsay on 10/31/2016 at 4:48 p.m. Electronically Signed   By: Logan Bores M.D.   On: 10/31/2016 17:44   Ct Abdomen Pelvis W Contrast  Result Date: 10/31/2016 CLINICAL DATA:  Abdominal distension EXAM: CT ABDOMEN AND PELVIS WITH CONTRAST TECHNIQUE: Multidetector  CT imaging of the abdomen and pelvis was performed using the standard protocol following bolus administration of intravenous contrast. CONTRAST:  100 mL Isovue 370 intravenous COMPARISON:  CT chest 09/05/2016 FINDINGS: Lower chest: Small bilateral pleural effusions. Mild passive atelectasis in the posterior lung bases. Normal heart size. Hepatobiliary: Heterogenous fatty infiltration. More focal heterogeneous ill-defined hypodensity near the falciform ligament. No calcified gallstones or biliary dilatation. Pancreas: Unremarkable. No pancreatic ductal dilatation or surrounding inflammatory changes. Spleen: Normal in size without focal abnormality. Adrenals/Urinary Tract: Adrenal glands are within normal limits. Delayed phase imaging of the kidneys. No hydronephrosis. The bladder is grossly unremarkable Stomach/Bowel: The stomach is nonenlarged. No dilated small bowel. Moderate stool in the colon. Normal appendix. No colon wall thickening. Vascular/Lymphatic: Aortic atherosclerosis. No aneurysmal dilatation. Pre aortic lymph node measuring 1 cm. Subcentimeter retroperitoneal lymph nodes. No significant pelvic adenopathy. Reproductive: Status post hysterectomy. No adnexal masses. Other: Negative for free air or free fluid. Musculoskeletal: Multiple lucent lesions within the vertebral bodies and faint foci of sclerosis within the vertebral bodies. Right greater than left sclerosis of the iliac bone. Soft tissue thickening around the right posterior iliac bone with widening of the right SI joint. Mild irregular sclerosis at the inferior pubic rami and ischium. Fusion of the L4 and L5 vertebral bodies. IMPRESSION: 1. Negative for a bowel obstruction.  Moderate stool in the colon. 2. Small bilateral pleural effusions 3. Heterogenous fat infiltration of liver. Ill-defined hypodensity near the falciform ligament, although location would suggest focal fat infiltration, consideration is also given to a metastatic lesion given  its ill-defined appearance. 4. Small upper abdominal and periaortic lymph nodes. 5. Multiple lucent and sclerotic lesions within the vertebral bodies and pelvis consistent with skeletal metastatic disease. Electronically Signed   By: Donavan Foil M.D.   On: 10/31/2016 18:16   Nm Pulmonary Perf And Vent  Result Date: 10/13/2016 CLINICAL DATA:  Breast cancer, weakness, loss of appetite, tachycardia, question pulmonary embolism EXAM: NUCLEAR MEDICINE VENTILATION - PERFUSION LUNG SCAN TECHNIQUE: Ventilation images were obtained in multiple projections using inhaled  aerosol Tc-11m DTPA. Perfusion images were obtained in multiple projections after intravenous injection of Tc-63m MAA. RADIOPHARMACEUTICALS:  38 mCi Technetium-21m DTPA aerosol inhalation and 5 mCi Technetium-51m MAA IV COMPARISON:  None Radiographic correlation:  Chest radiograph 10/13/2016 FINDINGS: Ventilation: Patchy ventilation throughout both lungs with numerous peripheral areas of subsegmental diminished ventilation. Diminished ventilation at medial RIGHT lower lobe base. Larger area of diminished ventilation at the LEFT upper lobe in a nonsegmental fashion. Perfusion: Mildly diminished perfusion in a nonsegmental fashion in the LEFT upper lobe. Diminished perfusion at the medial RIGHT lower lobe base matching ventilation. No additional segmental or subsegmental perfusion defects identified. COPD changes with RIGHT pleural effusion and bibasilar atelectasis. IMPRESSION: Matching ventilation and perfusion findings as above. Low probability for pulmonary embolism. Electronically Signed   By: Lavonia Dana M.D.   On: 10/13/2016 12:35   Dg Chest Port 1 View  Result Date: 10/31/2016 CLINICAL DATA:  Assess for aspiration. History of metastatic breast cancer. EXAM: PORTABLE CHEST 1 VIEW COMPARISON:  Chest radiograph October 13, 2016 FINDINGS: Bibasilar airspace opacities and small pleural effusions. Similar mild chronic interstitial prominence.  Cardiac silhouette is normal. Calcified aortic knob. Tortuous aorta. No pneumothorax. Bilateral mastectomies with surgical clips in axilla. Osteopenia. IMPRESSION: Bibasilar airspace opacities, potential pneumonia with small pleural effusions. Aortic Atherosclerosis (ICD10-I70.0). Electronically Signed   By: Elon Alas M.D.   On: 10/31/2016 16:15    Microbiology: No results found for this or any previous visit (from the past 240 hour(s)).   Labs: Basic Metabolic Panel:  Recent Labs Lab 11/01/2016 0552 11/02/2016 2019 10/30/16 0535 10/31/16 0445 11/01/16 0910  NA 150* 148* 147* 141 141  K 3.5 3.3* 3.4* 3.1* 2.8*  CL 108 110 110 104 101  CO2 27 26 25 27 27   GLUCOSE 114* 119* 127* 114* 102*  BUN 19 17 15 12 11   CREATININE 0.58 0.50 0.41* 0.45 0.46  CALCIUM 8.8* 9.0 8.8* 8.6* 8.1*   Liver Function Tests:  Recent Labs Lab 10/31/16 0445  AST 26  ALT 19  ALKPHOS 228*  BILITOT 0.9  PROT 5.7*  ALBUMIN 2.5*   No results for input(s): LIPASE, AMYLASE in the last 168 hours. No results for input(s): AMMONIA in the last 168 hours. CBC:  Recent Labs Lab 10/28/2016 2019  10/30/16 0535 10/30/16 0812 10/31/16 0445 10/31/16 1710 11/01/16 0910  WBC 8.3  < > 8.5 8.0 7.6 8.3 8.4  NEUTROABS 6.1  --   --   --   --   --   --   HGB 7.7*  < > 7.7* 8.1* 7.6* 7.5* 7.7*  HCT 24.6*  < > 24.6* 25.9* 24.1* 23.7* 24.8*  MCV 86.0  < > 84.5 85.5 82.5 83.7 83.5  PLT 152  < > 133* 138* 135* 128* 139*  < > = values in this interval not displayed. Cardiac Enzymes: No results for input(s): CKTOTAL, CKMB, CKMBINDEX, TROPONINI in the last 168 hours. D-Dimer No results for input(s): DDIMER in the last 72 hours. BNP: Invalid input(s): POCBNP CBG:  Recent Labs Lab 10/28/16 2101 11/01/2016 0746 10/15/2016 1147  GLUCAP 121* 106* 149*   Anemia work up No results for input(s): VITAMINB12, FOLATE, FERRITIN, TIBC, IRON, RETICCTPCT in the last 72 hours. Urinalysis    Component Value Date/Time    COLORURINE YELLOW 10/11/2016 0350   APPEARANCEUR CLEAR 10/11/2016 0350   LABSPEC 1.017 10/11/2016 0350   PHURINE 6.0 10/11/2016 0350   GLUCOSEU NEGATIVE 10/11/2016 0350   HGBUR NEGATIVE 10/11/2016 0350  BILIRUBINUR NEGATIVE 10/11/2016 0350   KETONESUR 20 (A) 10/11/2016 0350   PROTEINUR 30 (A) 10/11/2016 0350   UROBILINOGEN 4.0 (H) 11/26/2010 1105   NITRITE NEGATIVE 10/11/2016 0350   LEUKOCYTESUR NEGATIVE 10/11/2016 0350   Sepsis Labs Invalid input(s): PROCALCITONIN,  WBC,  LACTICIDVEN    SIGNED:  Gabrella Stroh, Orpah Melter, MD  [facility] Nov 12, 2016, 12:12 PM  If 7PM-7AM, please contact night-coverage [email or web address] Password [email or web address]

## 2016-12-04 NOTE — Progress Notes (Signed)
Provided emotional and spiritual support at bedside.  [name] was responsive to chaplain presence.  Level or orientation not clear from conversation.  [name] spoke about niece and recognizing that she was ill.  Appreciated prayers and presence.

## 2016-12-04 DEATH — deceased

## 2018-10-10 IMAGING — NM NM PULMONARY VENT & PERF
16 series · 16 of 16 positions shown · non-contrast
Comparison: None

Radiographic correlation:  Chest radiograph 10/13/2016

CLINICAL DATA: Breast cancer, weakness, loss of appetite,
tachycardia, question pulmonary embolism

EXAM:
NUCLEAR MEDICINE VENTILATION - PERFUSION LUNG SCAN
TECHNIQUE: Ventilation images were obtained in multiple projections using
inhaled aerosol Hc-55m DTPA. Perfusion images were obtained in
multiple projections after intravenous injection of Hc-55m MAA.
RADIOPHARMACEUTICALS:  38 mCi Nechnetium-66m DTPA aerosol inhalation
and 5 mCi Nechnetium-66m MAA IV

[Series 1: ant/post vent · 4.14mm/px · 1 of 1 slices shown (1 of 2)]
[im 1/1]
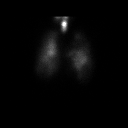

[Series 1: ant/post vent · 4.14mm/px · 1 of 1 slices shown (2 of 2)]
[im 1/1]
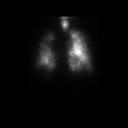

[Series 2: lao/rpo vent · 4.14mm/px · 1 of 1 slices shown (1 of 2)]
[im 1/1]
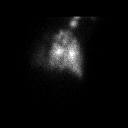

[Series 2: lao/rpo vent · 4.14mm/px · 1 of 1 slices shown (2 of 2)]
[im 1/1]
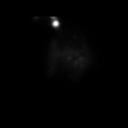

[Series 3: lt lat/rt lat vent · 4.14mm/px · 1 of 1 slices shown (1 of 2)]
[im 1/1]
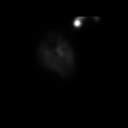

[Series 3: lt lat/rt lat vent · 4.14mm/px · 1 of 1 slices shown (2 of 2)]
[im 1/1]
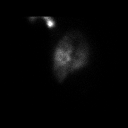

[Series 4: lpo/rao vent · 4.14mm/px · 1 of 1 slices shown (1 of 2)]
[im 1/1]
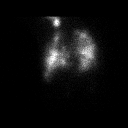

[Series 4: lpo/rao vent · 4.14mm/px · 1 of 1 slices shown (2 of 2)]
[im 1/1]
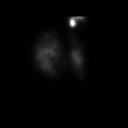

[Series 5: ant/post perf · 4.14mm/px · 1 of 1 slices shown (1 of 2)]
[im 1/1]
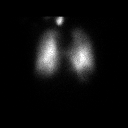

[Series 5: ant/post perf · 4.14mm/px · 1 of 1 slices shown (2 of 2)]
[im 1/1]
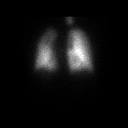

[Series 6: lao/rpo perf · 4.14mm/px · 1 of 1 slices shown (1 of 2)]
[im 1/1]
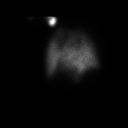

[Series 6: lao/rpo perf · 4.14mm/px · 1 of 1 slices shown (2 of 2)]
[im 1/1]
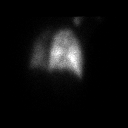

[Series 7: lt lat/rt lat perf · 4.14mm/px · 1 of 1 slices shown (1 of 2)]
[im 1/1]
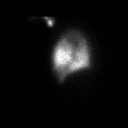

[Series 7: lt lat/rt lat perf · 4.14mm/px · 1 of 1 slices shown (2 of 2)]
[im 1/1]
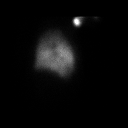

[Series 8: lpo/rao perf · 4.14mm/px · 1 of 1 slices shown (1 of 2)]
[im 1/1]
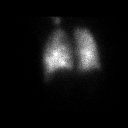

[Series 8: lpo/rao perf · 4.14mm/px · 1 of 1 slices shown (2 of 2)]
[im 1/1]
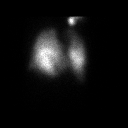

[16 of 16 positions shown; findings below may reference images not displayed]

FINDINGS: Ventilation: Patchy ventilation throughout both lungs with numerous
peripheral areas of subsegmental diminished ventilation. Diminished
ventilation at medial RIGHT lower lobe base. Larger area of
diminished ventilation at the LEFT upper lobe in a nonsegmental
fashion.

Perfusion: Mildly diminished perfusion in a nonsegmental fashion in
the LEFT upper lobe. Diminished perfusion at the medial RIGHT lower
lobe base matching ventilation. No additional segmental or
subsegmental perfusion defects identified.

COPD changes with RIGHT pleural effusion and bibasilar atelectasis.
IMPRESSION: Matching ventilation and perfusion findings as above.

Low probability for pulmonary embolism.
